# Patient Record
Sex: Male | Born: 1940 | Race: White | Hispanic: No | Marital: Married | Smoking: Never smoker
Health system: Southern US, Community
[De-identification: ages and names within clinical notes are randomized; demographics above are authoritative.]

## PROBLEM LIST (undated history)

## (undated) DIAGNOSIS — G454 Transient global amnesia: Secondary | ICD-10-CM

## (undated) DIAGNOSIS — K219 Gastro-esophageal reflux disease without esophagitis: Secondary | ICD-10-CM

## (undated) DIAGNOSIS — I6529 Occlusion and stenosis of unspecified carotid artery: Secondary | ICD-10-CM

## (undated) DIAGNOSIS — I739 Peripheral vascular disease, unspecified: Secondary | ICD-10-CM

## (undated) DIAGNOSIS — I493 Ventricular premature depolarization: Secondary | ICD-10-CM

## (undated) DIAGNOSIS — R51 Headache: Secondary | ICD-10-CM

## (undated) DIAGNOSIS — Z87442 Personal history of urinary calculi: Secondary | ICD-10-CM

## (undated) DIAGNOSIS — I10 Essential (primary) hypertension: Secondary | ICD-10-CM

## (undated) DIAGNOSIS — M1990 Unspecified osteoarthritis, unspecified site: Secondary | ICD-10-CM

## (undated) DIAGNOSIS — R519 Headache, unspecified: Secondary | ICD-10-CM

## (undated) DIAGNOSIS — E785 Hyperlipidemia, unspecified: Secondary | ICD-10-CM

## (undated) HISTORY — DX: Hyperlipidemia, unspecified: E78.5

## (undated) HISTORY — PX: TONSILLECTOMY: SUR1361

## (undated) HISTORY — PX: TENDON REPAIR: SHX5111

## (undated) HISTORY — PX: CAROTID ENDARTERECTOMY: SUR193

## (undated) HISTORY — DX: Ventricular premature depolarization: I49.3

## (undated) HISTORY — DX: Occlusion and stenosis of unspecified carotid artery: I65.29

## (undated) HISTORY — DX: Essential (primary) hypertension: I10

---

## 1992-02-15 HISTORY — PX: BUNIONECTOMY: SHX129

## 1997-02-14 HISTORY — PX: CARPAL TUNNEL RELEASE: SHX101

## 1998-06-18 ENCOUNTER — Emergency Department (HOSPITAL_COMMUNITY): Admission: EM | Admit: 1998-06-18 | Discharge: 1998-06-18 | Payer: Self-pay | Admitting: Emergency Medicine

## 2002-09-23 ENCOUNTER — Encounter: Admission: RE | Admit: 2002-09-23 | Discharge: 2002-09-23 | Payer: Self-pay | Admitting: Gastroenterology

## 2002-09-23 ENCOUNTER — Encounter: Payer: Self-pay | Admitting: Gastroenterology

## 2002-10-22 ENCOUNTER — Ambulatory Visit (HOSPITAL_COMMUNITY): Admission: RE | Admit: 2002-10-22 | Discharge: 2002-10-22 | Payer: Self-pay | Admitting: *Deleted

## 2005-03-02 ENCOUNTER — Ambulatory Visit (HOSPITAL_COMMUNITY): Admission: RE | Admit: 2005-03-02 | Discharge: 2005-03-02 | Payer: Self-pay | Admitting: Orthopedic Surgery

## 2005-03-02 ENCOUNTER — Encounter (INDEPENDENT_AMBULATORY_CARE_PROVIDER_SITE_OTHER): Payer: Self-pay | Admitting: Specialist

## 2009-03-31 ENCOUNTER — Encounter: Admission: RE | Admit: 2009-03-31 | Discharge: 2009-03-31 | Payer: Self-pay | Admitting: Family Medicine

## 2009-10-23 ENCOUNTER — Emergency Department (HOSPITAL_COMMUNITY): Admission: EM | Admit: 2009-10-23 | Discharge: 2009-10-23 | Payer: Self-pay | Admitting: Emergency Medicine

## 2010-04-27 ENCOUNTER — Other Ambulatory Visit: Payer: Self-pay | Admitting: Gastroenterology

## 2010-04-29 LAB — DIFFERENTIAL
Basophils Absolute: 0.1 10*3/uL (ref 0.0–0.1)
Basophils Relative: 1 % (ref 0–1)
Eosinophils Absolute: 0.5 10*3/uL (ref 0.0–0.7)
Eosinophils Relative: 7 % — ABNORMAL HIGH (ref 0–5)
Lymphs Abs: 2.2 10*3/uL (ref 0.7–4.0)
Monocytes Relative: 10 % (ref 3–12)
Neutro Abs: 3.6 10*3/uL (ref 1.7–7.7)
Neutrophils Relative %: 51 % (ref 43–77)

## 2010-04-29 LAB — POCT I-STAT, CHEM 8
BUN: 16 mg/dL (ref 6–23)
Calcium, Ion: 1.17 mmol/L (ref 1.12–1.32)
Creatinine, Ser: 1 mg/dL (ref 0.4–1.5)
Glucose, Bld: 94 mg/dL (ref 70–99)

## 2010-04-29 LAB — CBC
HCT: 39.2 % (ref 39.0–52.0)
Hemoglobin: 13.2 g/dL (ref 13.0–17.0)
MCHC: 33.7 g/dL (ref 30.0–36.0)
Platelets: 198 10*3/uL (ref 150–400)
RBC: 4.52 MIL/uL (ref 4.22–5.81)

## 2010-04-30 ENCOUNTER — Ambulatory Visit
Admission: RE | Admit: 2010-04-30 | Discharge: 2010-04-30 | Disposition: A | Discharge status: Home / Self Care | Payer: Medicare Other | Source: Ambulatory Visit | Attending: Gastroenterology | Admitting: Gastroenterology

## 2010-07-02 NOTE — Op Note (Signed)
NAME:  Patrick Vargas, Patrick Vargas NO.:  [id number]   MEDICAL RECORD NO.:  [id number]          PATIENT TYPE:  AMB   LOCATION:  DAY                          FACILITY:  [facility]   PHYSICIAN:  Marlowe Kays, M.D.  DATE OF BIRTH:  May 27, 1940   DATE OF PROCEDURE:  03/02/2005  DATE OF DISCHARGE:                                 OPERATIVE REPORT   PREOPERATIVE DIAGNOSES:  1.  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  2.  Tenosynovitis secondary to large osteophytes in midfoot right .   POSTOPERATIVE DIAGNOSES:  1.  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  2.  Tenosynovitis secondary to large osteophytes in midfoot right .   OPERATION:  1.  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  2.  Dorsal tenosynovectomy and excision of large osteophytes and calcium      deposition from tarsal-metatarsal and mid foot on the right.   SURGEON:  Marlowe Kays, M.D.   ASSISTANT:  Nurse.   ANESTHESIA:  General.   PATHOLOGY AND JUSTIFICATION FOR PROCEDURE:  I performed Morton's neuroma  excisions in his left foot. He has had extended pain and tenderness in the  second and third web space only in the right foot with findings consistent  with a Morton's neuroma there as well.  He also has had progressive fluid  accumulation on the dorsum of his right foot with lateral x-ray  demonstrating large osteophytes mainly from the tarsal metatarsal joint but  also proximal to it and an area of calcium deposition adjacent to these  osteophytes.   DESCRIPTION OF PROCEDURE:  Satisfactory general anesthesia, pneumatic  tourniquet, leg was esmarched out nonsterilely and prepped from just above  the ankle to the toes with DuraPrep, draped in a sterile field. I first made  a dorsal web splitting incision second and third toes. There was a good bit  of scar present. The Morton's neuroma is not as well defined as normally  with a lot of scar present. I basically cleaned out all the  scar, all the  fibrous tissue and congealed nerve sending it to pathology for  identification. At the conclusion of the clean out, all soft tissue and  interposed between the second and third metatarsal heads had been removed. I  then made a lazy S incision on the dorsum of his foot, neurovascular  structures were protected. Working down to the osteophytes, the contained  cyst ruptured on its own and a serosanguineous clear appearance. I did a  synovectomy of the local tendons, there was nothing that looked rheumatoid.  The large osteophytes were isolated and removed with a combination of  osteotome and rongeur, including soft calcium type mass to the more lateral  side of the first tarsometatarsal joint. When I had cleaned off the  osteophytes visible, I covered the raw bone with bone wax and released the  tourniquet, several small bleeders were coagulated. Both wounds essentially  dry on closure. I irrigated the wound well and infiltrated both wounds with  half percent plain Marcaine and closed them with interrupted 3-0 Vicryl  subcutaneous tissue and in the  foot a combination of interrupted and running 4-0 nylon and in the webspace  interrupted 4-0 nylon. Betadine adaptic dry sterile dressing were applied.  He tolerated the procedure well and was taken to the recovery room in  satisfactory condition with no known complications.           ______________________________  Marlowe Kays, M.D.     JA/MEDQ  D:  03/02/2005  T:  03/02/2005  Job:  [id number]

## 2010-07-02 NOTE — Op Note (Signed)
   NAME:  Patrick Vargas, Patrick Vargas NO.:  [id number]   MEDICAL RECORD NO.:  [id number]                   PATIENT TYPE:  AMB   LOCATION:  ENDO                                 FACILITY:  [facility]   PHYSICIAN:  Graylin Shiver, M.D.                DATE OF BIRTH:  11-15-1940   DATE OF PROCEDURE:  10/22/2002  DATE OF DISCHARGE:                                 OPERATIVE REPORT   PROCEDURE PERFORMED:  Esophagogastroduodenoscopy with endoscopic balloon  dilation of a Schatzki's ring.   ENDOSCOPIST:  Graylin Shiver, M.D.   INDICATIONS FOR PROCEDURE:  Dysphagia.  Barium swallow revealed a small  hiatal hernia with a shallow ring at the gastroesophageal junction.  Informed consent was obtained after explanation of the risks of bleeding,  infection and perforation.   PREMEDICATIONS:  Fentanyl 80 mcg IV, Versed 8 mg IV.   DESCRIPTION OF PROCEDURE:  With the patient in the left lateral decubitus  position the Olympus gastroscope was inserted into the oropharynx and passed  into the esophagus.  It was advanced down the esophagus and into the stomach  and into the duodenum.  The second portion and bulb of the duodenum were  normal.  The stomach looked normal in its entirety including the upper  fundus and cardia seen on retroflexion.  The scope was brought back into the  esophagus. The esophagogastric junction was at 40 cm.  There was a small  hiatal hernia.  A  Schatzki's ring was intermittently noted at this region.  The rest of the  esophagus looked normal.  An endoscopic balloon dilator was advanced down  the scope and appropriately placed at the level of the ring.  It was  inflated to 15, then 16.5, then 18 mm and held in place at each level for  one minute.  The balloon was then deflated and removed along with the scope.  The patient tolerated the procedure well without complications.   IMPRESSION:  Dysphagia secondary to a small Schatzki's ring.  This was  dilated to 18  mm.  There is also a small hiatal hernia.                                                Graylin Shiver, M.D.    Germain Osgood  D:  10/22/2002  T:  10/22/2002  Job:  [id number]   cc:   Otilio Connors. Gerri Spore, M.D.  [address]  [place]  [place] [address]  Fax: [phone]

## 2010-11-12 ENCOUNTER — Other Ambulatory Visit (HOSPITAL_COMMUNITY): Payer: Self-pay | Admitting: Orthopedic Surgery

## 2010-11-12 ENCOUNTER — Encounter (HOSPITAL_COMMUNITY)
Admission: RE | Admit: 2010-11-12 | Discharge: 2010-11-12 | Disposition: A | Discharge status: Home / Self Care | Payer: Medicare Other | Source: Ambulatory Visit | Attending: Orthopedic Surgery | Admitting: Orthopedic Surgery

## 2010-11-12 DIAGNOSIS — M1711 Unilateral primary osteoarthritis, right knee: Secondary | ICD-10-CM

## 2010-11-12 LAB — DIFFERENTIAL
Basophils Absolute: 0 10*3/uL (ref 0.0–0.1)
Eosinophils Relative: 3 % (ref 0–5)
Monocytes Absolute: 0.5 10*3/uL (ref 0.1–1.0)
Neutrophils Relative %: 51 % (ref 43–77)

## 2010-11-12 LAB — CBC
MCHC: 35.3 g/dL (ref 30.0–36.0)
MCV: 86.7 fL (ref 78.0–100.0)
Platelets: 214 10*3/uL (ref 150–400)
RDW: 13.2 % (ref 11.5–15.5)
WBC: 6.2 10*3/uL (ref 4.0–10.5)

## 2010-11-12 LAB — BASIC METABOLIC PANEL
CO2: 28 mEq/L (ref 19–32)
Calcium: 9.8 mg/dL (ref 8.4–10.5)
Chloride: 99 mEq/L (ref 96–112)
Creatinine, Ser: 1.02 mg/dL (ref 0.50–1.35)
GFR calc Af Amer: >60 mL/min (ref >60)
Glucose, Bld: 92 mg/dL (ref 70–99)
Sodium: 138 mEq/L (ref 135–145)

## 2010-11-12 LAB — URINALYSIS, ROUTINE W REFLEX MICROSCOPIC
Bilirubin Urine: NEGATIVE
Glucose, UA: NEGATIVE mg/dL
Hgb urine dipstick: NEGATIVE
pH: 7 (ref 5.0–8.0)

## 2010-11-12 LAB — PROTIME-INR
INR: 0.92 (ref 0.00–1.49)
Prothrombin Time: 12.6 seconds (ref 11.6–15.2)

## 2010-11-12 LAB — TYPE AND SCREEN
ABO/RH(D): A POS
Antibody Screen: NEGATIVE

## 2010-11-12 LAB — SURGICAL PCR SCREEN: MRSA, PCR: NEGATIVE

## 2010-11-22 ENCOUNTER — Inpatient Hospital Stay (HOSPITAL_COMMUNITY)
Admission: RE | Admit: 2010-11-22 | Discharge: 2010-11-25 | DRG: 470 | Disposition: A | Discharge status: Home Health | Payer: Medicare Other | Source: Ambulatory Visit | Attending: Orthopedic Surgery | Admitting: Orthopedic Surgery

## 2010-11-22 DIAGNOSIS — Z7982 Long term (current) use of aspirin: Secondary | ICD-10-CM

## 2010-11-22 DIAGNOSIS — Z888 Allergy status to other drugs, medicaments and biological substances status: Secondary | ICD-10-CM

## 2010-11-22 DIAGNOSIS — I10 Essential (primary) hypertension: Secondary | ICD-10-CM | POA: Diagnosis present

## 2010-11-22 DIAGNOSIS — M1710 Unilateral primary osteoarthritis, unspecified knee: Principal | ICD-10-CM | POA: Diagnosis present

## 2010-11-22 DIAGNOSIS — Z886 Allergy status to analgesic agent status: Secondary | ICD-10-CM

## 2010-11-22 DIAGNOSIS — E785 Hyperlipidemia, unspecified: Secondary | ICD-10-CM | POA: Diagnosis present

## 2010-11-22 NOTE — Op Note (Signed)
NAME:  STEPHENSON, CICHY NO.:  [id number]  MEDICAL RECORD NO.:  [id number]  LOCATION:  5034                         FACILITY:  [facility]  PHYSICIAN:  Feliberto Gottron. Turner Daniels, M.D.   DATE OF BIRTH:  [birth year]  DATE OF PROCEDURE:  11/22/2010 DATE OF DISCHARGE:                              OPERATIVE REPORT   PREOPERATIVE DIAGNOSIS:  End-stage arthritis, right knee with varus deformity.  POSTOPERATIVE DIAGNOSIS:  End-stage arthritis, right knee with varus deformity.  PROCEDURE:  Right total knee arthroplasty using DePuy Sigma RP components 3 right femur, 4 tibia, 10 mm Sigma RP bearing, and a 38-mm patellar button double batch of DePuy HV cement with 1500 mg of Zinacef. All components cemented.  SURGEON:  Feliberto Gottron. Turner Daniels, MD  FIRST ASSISTANT:  Shirl Harris, PA-C  ANESTHETIC:  General endotracheal.  ESTIMATED BLOOD LOSS:  Minimal.  FLUID REPLACEMENT:  1500 mL of crystalloid.  DRAINS PLACED:  Two medium Hemovacs, Foley catheter.  URINE OUTPUT:  300 mL.  TOURNIQUET TIME:  1 hour and 5 minutes.  INDICATIONS FOR PROCEDURE:  A 70 year old man end-stage arthritis right knee with varus deformity bone-on-bone for the last few years.  He has failed conservative measures with anti-inflammatory medicines, exercise program, judicious use of narcotics and cortisone injections, was offered viscosupplementation but declined.  He is down to bone-on-bone and now desires elective right total knee arthroplasty.  Risks and benefits of surgery were discussed.  Questions answered.  DESCRIPTION OF PROCEDURE:  The patient identified by armband and received preoperative IV antibiotics in the holding area at [facility], also received right femoral nerve block.  Taken to operating room 5 where appropriate anesthetic monitors were attached and general endotracheal anesthesia induced.  The patient in supine position Foley catheter inserted and PAS hose applied to the  contralateral left limb. Tourniquet applied high to the right thigh, lateral post and foot positioner applied to the table, right lower extremity prepped and draped in usual sterile fashion from the ankle to the midthigh.  Time- out procedure performed.  Limb wrapped with an Esmarch bandage, tourniquet inflated to 350 mmHg.  Began the operation by making the anterior midline incision starting handbreadth above the patella going over the patella 1 cm medial to and 4 cm distal to the tibial tubercle. Small bleeders in the skin and subcutaneous tissue identified and cauterized with electrocautery.  Transverse retinaculum incised, reflected medially allowing medial parapatellar arthrotomy.  Patella everted, prepatellar fat pad resected, superficial medial and collateral ligament elevated from anterior-posterior off the proximal tibia allowing [place] to flex and externally rotate the tibia enhancing our exposure.  The anterior one half of the menisci were then resected as were the cruciate ligaments, and we placed a posterior medial Z retractor, McCulloch retractor through the notch and a lateral Hohmann retractor and then drilled the proximal tibia with DePuy step drill followed by the IM rod and a 2-degree posterior slope cutting guide removing 3-4 mm of bone medially, 8-9 mm of bone and cartilage laterally.  We then directed our attention to the distal femur and entered the femoral canal 2 mm anterior to the PCL origin with the step drill followed  by the IM rod and a 5 degree left distal femoral cutting guide was applied at 11 mm along the left epicondylar axis, distal femoral cut accomplished.  We then sized with the posterior referencing sizing guide for a 3 right femoral component, applied the pins in 3 degrees of external rotation followed by the chamfer cutting guide the anterior-posterior chamfer cuts without difficulty, and then the Sigma RP box cut.  The knee was then brought to full  extension.  We checked our extension gap which was excellent for a 10-mm bearing as was the flexion gap.  Posterior horns of the menisci were resected.  The patella was measured at 24 mm, cutting guide set at 15, posterior 9-10 mm of the patella resected, sized for a 38 button and drilled.  We then once again hyperflexed with external rotation, exposed the proximal tibia which was sized to a #4 base plate.  This was pinned into place followed by the smokestack conical reamer and Delta fin keel punch.  We then hammered into place a 3 right trial femoral component, drilled the lugs, inserted the 10-mm bearing trial and a 38-mm patellar button trial.  The knee was reduced brought to full extension.  Ligamentous stability was excellent at 0, 45, and 90 degrees of flexion.  At this point, all trial components were removed, bony surfaces were water picked, clean dried with suction and sponges.  Double batch of DePuy HV cement was mixed and applied to all bony metallic mating surfaces.  There was 1500 mg of Zinacef and cement by the way applied to all bony metallic mating surfaces except for the posterior condyles of the femur itself.  In order, we hammered into place a 4 tibial base plate and removed excess cement, a 3 right femoral component removed the excess cement, a 38 mm patellar button was squeezed into place, excess cement removed and a 10- mm Sigma RP bearing was inserted.  The knee was reduced and held in full extension with compression as the cement cured.  The tissues were then water picked clean and dried with suction.  Medium Hemovac drains were placed from an anterolateral approach, and we then closed in layers with running #1 Vicryl suture in the parapatellar arthrotomy, 0 and 2-0 undyed Vicryl suture in the subcutaneous tissue, and skin staples with a dressing of Xeroform, 4 x 4 dressing sponges, Webril, and an Ace wrap. The patient awakened, extubated and taken to the recovery  room without difficulty.     Feliberto Gottron. Turner Daniels, M.D.     Ovid Curd  D:  11/22/2010  T:  11/22/2010  Job:  [id number]  Electronically Signed by Gean Birchwood M.D. on 11/22/2010 09:57:34 PM

## 2010-11-23 LAB — BASIC METABOLIC PANEL
Calcium: 8.5 mg/dL (ref 8.4–10.5)
Creatinine, Ser: 1.21 mg/dL (ref 0.50–1.35)
GFR calc non Af Amer: 60 mL/min — ABNORMAL LOW (ref >90)
Sodium: 132 mEq/L — ABNORMAL LOW (ref 135–145)

## 2010-11-23 LAB — CBC
Hemoglobin: 11.6 g/dL — ABNORMAL LOW (ref 13.0–17.0)
MCV: 87.4 fL (ref 78.0–100.0)
RBC: 3.88 MIL/uL — ABNORMAL LOW (ref 4.22–5.81)
RDW: 13.5 % (ref 11.5–15.5)
WBC: 11.5 10*3/uL — ABNORMAL HIGH (ref 4.0–10.5)

## 2010-11-23 LAB — PROTIME-INR: INR: 1.12 (ref 0.00–1.49)

## 2010-11-24 LAB — CBC
HCT: 30.6 % — ABNORMAL LOW (ref 39.0–52.0)
MCH: 30.1 pg (ref 26.0–34.0)
MCHC: 34.6 g/dL (ref 30.0–36.0)

## 2010-11-24 LAB — PROTIME-INR: INR: 1.61 — ABNORMAL HIGH (ref 0.00–1.49)

## 2010-11-25 LAB — CBC
HCT: 29.1 % — ABNORMAL LOW (ref 39.0–52.0)
Hemoglobin: 9.9 g/dL — ABNORMAL LOW (ref 13.0–17.0)
MCH: 29.6 pg (ref 26.0–34.0)
MCV: 86.9 fL (ref 78.0–100.0)
RBC: 3.35 MIL/uL — ABNORMAL LOW (ref 4.22–5.81)
WBC: 7.5 10*3/uL (ref 4.0–10.5)

## 2010-12-04 HISTORY — PX: TOTAL KNEE ARTHROPLASTY: SHX125

## 2010-12-10 NOTE — Discharge Summary (Signed)
  NAME:  Patrick Vargas, Patrick Vargas NO.:  [id number]  MEDICAL RECORD NO.:  [id number]  LOCATION:  5034                         FACILITY:  [facility]  PHYSICIAN:  Feliberto Gottron. Turner Daniels, M.D.   DATE OF BIRTH:  [birth year]  DATE OF ADMISSION:  11/22/2010 DATE OF DISCHARGE:  11/25/2010                              DISCHARGE SUMMARY   CHIEF COMPLAINT:  Right knee pain.  HISTORY OF PRESENT ILLNESS:  This is a 69 year old gentleman who complains of severe unremitting pain in his right knee despite extensive conservative treatment.  He now desires a surgical intervention.  All risks and benefits of surgery were discussed with the patient.  PAST MEDICAL HISTORY:  Significant for high cholesterol and hypertension.  He has not had any surgery.  SOCIAL HISTORY:  He denies use of tobacco and drinks occasional alcohol. He is retired.  ALLERGIES:  He has allergies to AMOXICILLIN and OXYCODONE.  FAMILY HISTORY:  Positive for diabetes and heart disease.  OBJECTIVE:  EXTREMITIES:  Gross examination of the right knee demonstrates a varus deformity.  Range of motion is 10-120 degrees.  He has a trace effusion.  X-rays demonstrate bone-on-bone degenerative joint disease in the medial compartment of the right knee.  PREOPERATIVE LABORATORY DATA:  White blood cells 11.5, red blood cells 3.88, hemoglobin 11.6, hematocrit 33.9, platelets 183.  PT 14.6, INR 1.12.  Sodium 132, potassium 3.8, chloride 96, glucose 122, BUN 26, creatinine 1.21.  HOSPITAL COURSE:  Patrick Vargas was admitted to [facility] on November 22, 2010, when he underwent right below-knee arthroplasty.  The procedure was performed by Dr. Gean Birchwood and the patient tolerated it well.  Two Hemovac drains were placed into the right knee.  A perioperative Foley catheter was also placed.  He was transferred to the floor on Lovenox and Coumadin for DVT prophylaxis.  On the first postoperative day, he was awake and alert and tolerating p.o. intake  well.  His knee pain was 2/10.  His drain was removed without difficulty.  Hemoglobin was 11.6. On the second postoperative day, he was making good progress with physical therapy.  Hemoglobin was 10.6.  Surgical dressing was clean, it was changed and his incision was benign.  On the third postoperative day, he was doing well and had met all of his physical therapy goals, so he was discharged home.  DISPOSITION:  The patient was discharged home on November 25, 2010.  He was weightbearing as tolerated.  He will return to the clinic in 10 days for x-rays and staple removal.  He will remain on Coumadin for a total of 2 weeks with a target INR of 1.5-2.0.  Home Health Care would manage his wound, Coumadin, and physical therapy.  FINAL DIAGNOSIS:  End-stage degenerative joint disease of the right knee.     Patrick Harris, PA   ______________________________ Feliberto Gottron. Turner Daniels, M.D.    JW/MEDQ  D:  12/08/2010  T:  12/08/2010  Job:  [id number]  Electronically Signed by Patrick Harris PA on 12/10/2010 10:52:28 AM Electronically Signed by Gean Birchwood M.D. on 12/10/2010 01:28:18 PM

## 2011-03-15 DIAGNOSIS — H2510 Age-related nuclear cataract, unspecified eye: Secondary | ICD-10-CM | POA: Diagnosis not present

## 2011-04-12 DIAGNOSIS — E782 Mixed hyperlipidemia: Secondary | ICD-10-CM | POA: Diagnosis not present

## 2011-04-12 DIAGNOSIS — I10 Essential (primary) hypertension: Secondary | ICD-10-CM | POA: Diagnosis not present

## 2011-05-06 DIAGNOSIS — L570 Actinic keratosis: Secondary | ICD-10-CM | POA: Insufficient documentation

## 2011-05-06 DIAGNOSIS — D485 Neoplasm of uncertain behavior of skin: Secondary | ICD-10-CM | POA: Diagnosis not present

## 2011-05-06 DIAGNOSIS — Z85828 Personal history of other malignant neoplasm of skin: Secondary | ICD-10-CM | POA: Insufficient documentation

## 2011-05-06 DIAGNOSIS — C44319 Basal cell carcinoma of skin of other parts of face: Secondary | ICD-10-CM | POA: Diagnosis not present

## 2011-05-24 DIAGNOSIS — M1710 Unilateral primary osteoarthritis, unspecified knee: Secondary | ICD-10-CM | POA: Diagnosis not present

## 2011-06-01 DIAGNOSIS — C44319 Basal cell carcinoma of skin of other parts of face: Secondary | ICD-10-CM | POA: Diagnosis not present

## 2011-06-08 DIAGNOSIS — Z4802 Encounter for removal of sutures: Secondary | ICD-10-CM | POA: Diagnosis not present

## 2011-07-03 DIAGNOSIS — W57XXXA Bitten or stung by nonvenomous insect and other nonvenomous arthropods, initial encounter: Secondary | ICD-10-CM | POA: Diagnosis not present

## 2011-07-03 DIAGNOSIS — L089 Local infection of the skin and subcutaneous tissue, unspecified: Secondary | ICD-10-CM | POA: Diagnosis not present

## 2011-07-13 DIAGNOSIS — M25569 Pain in unspecified knee: Secondary | ICD-10-CM | POA: Diagnosis not present

## 2011-07-20 DIAGNOSIS — Z483 Aftercare following surgery for neoplasm: Secondary | ICD-10-CM | POA: Insufficient documentation

## 2011-08-08 DIAGNOSIS — L255 Unspecified contact dermatitis due to plants, except food: Secondary | ICD-10-CM | POA: Diagnosis not present

## 2011-11-15 DIAGNOSIS — E782 Mixed hyperlipidemia: Secondary | ICD-10-CM | POA: Diagnosis not present

## 2011-11-15 DIAGNOSIS — Z79899 Other long term (current) drug therapy: Secondary | ICD-10-CM | POA: Diagnosis not present

## 2011-11-15 DIAGNOSIS — M25569 Pain in unspecified knee: Secondary | ICD-10-CM | POA: Diagnosis not present

## 2011-12-16 DIAGNOSIS — I10 Essential (primary) hypertension: Secondary | ICD-10-CM | POA: Diagnosis not present

## 2011-12-16 DIAGNOSIS — E782 Mixed hyperlipidemia: Secondary | ICD-10-CM | POA: Diagnosis not present

## 2011-12-16 DIAGNOSIS — R9431 Abnormal electrocardiogram [ECG] [EKG]: Secondary | ICD-10-CM | POA: Diagnosis not present

## 2011-12-20 DIAGNOSIS — R0602 Shortness of breath: Secondary | ICD-10-CM | POA: Diagnosis not present

## 2011-12-20 DIAGNOSIS — I4949 Other premature depolarization: Secondary | ICD-10-CM | POA: Diagnosis not present

## 2011-12-20 DIAGNOSIS — Z79899 Other long term (current) drug therapy: Secondary | ICD-10-CM | POA: Diagnosis not present

## 2012-01-18 DIAGNOSIS — Z23 Encounter for immunization: Secondary | ICD-10-CM | POA: Diagnosis not present

## 2012-03-06 DIAGNOSIS — R3911 Hesitancy of micturition: Secondary | ICD-10-CM | POA: Diagnosis not present

## 2012-03-06 DIAGNOSIS — E782 Mixed hyperlipidemia: Secondary | ICD-10-CM | POA: Diagnosis not present

## 2012-03-06 DIAGNOSIS — Z79899 Other long term (current) drug therapy: Secondary | ICD-10-CM | POA: Diagnosis not present

## 2012-03-21 DIAGNOSIS — H2510 Age-related nuclear cataract, unspecified eye: Secondary | ICD-10-CM | POA: Diagnosis not present

## 2012-03-26 DIAGNOSIS — K219 Gastro-esophageal reflux disease without esophagitis: Secondary | ICD-10-CM | POA: Diagnosis not present

## 2012-03-26 DIAGNOSIS — Z1211 Encounter for screening for malignant neoplasm of colon: Secondary | ICD-10-CM | POA: Diagnosis not present

## 2012-04-12 ENCOUNTER — Other Ambulatory Visit: Payer: Self-pay | Admitting: Gastroenterology

## 2012-04-12 DIAGNOSIS — K5730 Diverticulosis of large intestine without perforation or abscess without bleeding: Secondary | ICD-10-CM | POA: Diagnosis not present

## 2012-04-12 DIAGNOSIS — D126 Benign neoplasm of colon, unspecified: Secondary | ICD-10-CM | POA: Diagnosis not present

## 2012-04-12 DIAGNOSIS — Z1211 Encounter for screening for malignant neoplasm of colon: Secondary | ICD-10-CM | POA: Diagnosis not present

## 2012-04-12 DIAGNOSIS — K620 Anal polyp: Secondary | ICD-10-CM | POA: Diagnosis not present

## 2012-08-31 ENCOUNTER — Encounter: Payer: Self-pay | Admitting: Cardiovascular Disease

## 2012-08-31 ENCOUNTER — Ambulatory Visit (INDEPENDENT_AMBULATORY_CARE_PROVIDER_SITE_OTHER): Payer: Medicare Other | Admitting: Cardiovascular Disease

## 2012-08-31 VITALS — BP 134/70 | HR 61 | Resp 15 | Ht 70.0 in | Wt 193.7 lb

## 2012-08-31 DIAGNOSIS — Z789 Other specified health status: Secondary | ICD-10-CM

## 2012-08-31 DIAGNOSIS — E785 Hyperlipidemia, unspecified: Secondary | ICD-10-CM | POA: Diagnosis not present

## 2012-08-31 DIAGNOSIS — I10 Essential (primary) hypertension: Secondary | ICD-10-CM | POA: Diagnosis not present

## 2012-08-31 DIAGNOSIS — I4949 Other premature depolarization: Secondary | ICD-10-CM | POA: Diagnosis not present

## 2012-08-31 DIAGNOSIS — I493 Ventricular premature depolarization: Secondary | ICD-10-CM

## 2012-08-31 NOTE — Patient Instructions (Signed)
Your physician recommends that you schedule a follow-up appointment in: 1 year Your physician recommends that you return for lab work just before the one-year appointment. Your physician encouraged you to lose weight for better health. Try to slim down to a waistline of 34 inches and overall weight of about 175 lb by this time next year. Your physician discussed the importance of regular exercise and recommended that you start or continue a regular exercise program for good health.

## 2012-09-03 DIAGNOSIS — E785 Hyperlipidemia, unspecified: Secondary | ICD-10-CM | POA: Insufficient documentation

## 2012-09-03 DIAGNOSIS — E663 Overweight: Secondary | ICD-10-CM | POA: Insufficient documentation

## 2012-09-03 DIAGNOSIS — Z789 Other specified health status: Secondary | ICD-10-CM | POA: Insufficient documentation

## 2012-09-03 DIAGNOSIS — I10 Essential (primary) hypertension: Secondary | ICD-10-CM | POA: Insufficient documentation

## 2012-09-03 DIAGNOSIS — I493 Ventricular premature depolarization: Secondary | ICD-10-CM | POA: Insufficient documentation

## 2012-09-03 NOTE — Assessment & Plan Note (Signed)
Good control. If he loses additional weight we might be able to further reduce antihypertensive medications.

## 2012-09-03 NOTE — Assessment & Plan Note (Signed)
Amongst the agents he has tried:Atorvastatin, simvastatin, rosuvastatin, all causing intolerable myalgia. He is also intolerant of Niaspan

## 2012-09-03 NOTE — Progress Notes (Signed)
Patient ID: Patrick Vargas, male   DOB: 07/25/1940, 72 y.o.   MRN: [id number]     Reason for office visit Followup hyperlipidemia and hypertension  Patrick Vargas returns for routine followup and has done quite well since his last appointment. He remains quite active. He plays golf 2-3 days a week in the summer. In the winter he goes to the gym. He has a total knee replacement and rides a stationary bike 2-3 days a week. He has lost about 3 pounds since his last appointment but remains overweight with a BMI of almost 28. He has not had any cardiovascular complaints since his last appointment. Blood pressure control is now consistently good.  In November of 2013 he had a normal echocardiogram. He has normal nuclear stress test in the past. He has never had coronary angiography.    Allergies  Allergen Reactions  . Amoxicillin Nausea Only  . Oxycodone Nausea Only  . Statins     Depression     Current Outpatient Prescriptions  Medication Sig Dispense Refill  . amLODipine (NORVASC) 10 MG tablet Take 10 mg by mouth daily.      Marland Kitchen aspirin 81 MG tablet Take 81 mg by mouth daily.      . CELEBREX 200 MG capsule 200 mg daily.      . cetirizine (ZYRTEC) 10 MG tablet Take 10 mg by mouth daily.      . colesevelam (WELCHOL) 625 MG tablet Take 1,875 mg by mouth daily.      . fosinopril (MONOPRIL) 20 MG tablet Take 20 mg by mouth daily.      . hydrochlorothiazide (HYDRODIURIL) 25 MG tablet Take 25 mg by mouth daily.      . pantoprazole (PROTONIX) 40 MG tablet Take 40 mg by mouth daily.      Marland Kitchen ZETIA 10 MG tablet Take 10 mg by mouth daily.       No current facility-administered medications for this visit.    Past Medical History  Diagnosis Date  . Dyslipidemia   . Systemic hypertension   . PVC's (premature ventricular contractions)     Past Surgical History  Procedure Laterality Date  . 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 Bunionectomy  1994    Left foot  . Carpal tunnel release  1999     Left hand    No family history on file.  History   Social History  . Marital Status: Married    Spouse Name: N/A    Number of Children: N/A  . Years of Education: N/A   Occupational History  . Not on file.   Social History Main Topics  . Smoking status: Never Smoker   . Smokeless tobacco: Not on file  . Alcohol Use: 2.5 oz/week    5 drink(s) per week  . Drug Use: No  . Sexually Active: Not on file   Other Topics Concern  . Not on file   Social History Narrative  . No narrative on file    Review of systems: The patient specifically denies any chest pain at rest or with exertion, dyspnea at rest or with exertion, orthopnea, paroxysmal nocturnal dyspnea, syncope, palpitations, focal neurological deficits, intermittent claudication, lower extremity edema, unexplained weight gain, cough, hemoptysis or wheezing.  The patient also denies abdominal pain, nausea, vomiting, dysphagia,  Constipation (he has occasional diarrhea), polyuria, polydipsia, dysuria, hematuria, frequency, urgency, abnormal bleeding or bruising, fever, chills, unexpected weight changes, mood swings, change in skin or hair  texture, change in voice quality, auditory or visual problems, allergic reactions or rashes, new musculoskeletal complaints other than usual "aches and pains".   PHYSICAL EXAM BP 134/70  Pulse 61  Resp 15  Ht 5\' 10"  (1.778 m)  Wt 87.862 kg (193 lb 11.2 oz)  BMI 27.79 kg/m2  General: Alert, oriented x3, no distress Head: no evidence of trauma, PERRL, EOMI, no exophtalmos or lid lag, no myxedema, no xanthelasma; normal ears, nose and oropharynx Neck: normal jugular venous pulsations and no hepatojugular reflux; brisk carotid pulses without delay and no carotid bruits Chest: clear to auscultation, no signs of consolidation by percussion or palpation, normal fremitus, symmetrical and full respiratory excursions Cardiovascular: normal position and quality of the apical impulse, regular  rhythm, normal first and second heart sounds, no murmurs, rubs or gallops Abdomen: no tenderness or distention, no masses by palpation, no abnormal pulsatility or arterial bruits, normal bowel sounds, no hepatosplenomegaly Extremities: no clubbing, cyanosis or edema; 2+ radial, ulnar and brachial pulses bilaterally; 2+ right femoral, posterior tibial and dorsalis pedis pulses; 2+ left femoral, posterior tibial and dorsalis pedis pulses; no subclavian or femoral bruits Neurological: grossly nonfocal   EKG: NSR  Lipid Panel  Cholesterol 205, triglycerides 120, HDL 42, LDL 139 January 2014  BMET    Component Value Date/Time   NA 132* 11/23/2010 0507   K 3.8 11/23/2010 0507   CL 96 11/23/2010 0507   CO2 28 11/23/2010 0507   GLUCOSE 122* 11/23/2010 0507   BUN 26* 11/23/2010 0507   CREATININE 1.21 11/23/2010 0507   CALCIUM 8.5 11/23/2010 0507   GFRNONAA 60* 11/23/2010 0507   GFRAA 69* 11/23/2010 0507     ASSESSMENT AND PLAN Hyperlipidemia Patrick Vargas's lipid parameters showed a steady trend of improvement as he has improved his diet and exercise. He seems to have benefited from treatment with WelChol and Zetia, although he has not reached target lipid parameters. He is physically active without functional limitations. He is encouraged to keep up the good work. He remains overweight and should try to reach a target weight of about 175 pounds with the waistline of no more than 34 inches. Once novel cholesterol lowering agent such as PCSK9 inhibitors become available, should probably give those a try.  Hypertension Good control. If he loses additional weight we might be able to further reduce antihypertensive medications.  Statin intolerance Amongst the agents he has tried:Atorvastatin, simvastatin, rosuvastatin, all causing intolerable myalgia. He is also intolerant of Niaspan  Overweight (BMI 25.0-29.9)    Frequent PVCs Chronic asymptomatic problem, not noticed on today's exam or EKG   Orders  Placed This Encounter  Procedures  . EKG 12-Lead   Meds ordered this encounter  Medications  . amLODipine (NORVASC) 10 MG tablet    Sig: Take 10 mg by mouth daily.  . CELEBREX 200 MG capsule    Sig: 200 mg daily.  Marland Kitchen ZETIA 10 MG tablet    Sig: Take 10 mg by mouth daily.  . fosinopril (MONOPRIL) 20 MG tablet    Sig: Take 20 mg by mouth daily.  . hydrochlorothiazide (HYDRODIURIL) 25 MG tablet    Sig: Take 25 mg by mouth daily.  . pantoprazole (PROTONIX) 40 MG tablet    Sig: Take 40 mg by mouth daily.  Marland Kitchen aspirin 81 MG tablet    Sig: Take 81 mg by mouth daily.  . cetirizine (ZYRTEC) 10 MG tablet    Sig: Take 10 mg by mouth daily.  . colesevelam [facility])  625 MG tablet    Sig: Take 1,875 mg by mouth daily.    Junious Silk, MD, [facility] [phone] office [phone] pager

## 2012-09-03 NOTE — Assessment & Plan Note (Signed)
Patrick Vargas's lipid parameters showed a steady trend of improvement as he has improved his diet and exercise. He seems to have benefited from treatment with WelChol and Zetia, although he has not reached target lipid parameters. He is physically active without functional limitations. He is encouraged to keep up the good work. He remains overweight and should try to reach a target weight of about 175 pounds with the waistline of no more than 34 inches. Once novel cholesterol lowering agent such as PCSK9 inhibitors become available, should probably give those a try.

## 2012-09-03 NOTE — Assessment & Plan Note (Signed)
Chronic asymptomatic problem, not noticed on today's exam or EKG

## 2012-10-02 DIAGNOSIS — M25569 Pain in unspecified knee: Secondary | ICD-10-CM | POA: Diagnosis not present

## 2012-10-09 DIAGNOSIS — M1710 Unilateral primary osteoarthritis, unspecified knee: Secondary | ICD-10-CM | POA: Diagnosis not present

## 2012-10-09 DIAGNOSIS — M25569 Pain in unspecified knee: Secondary | ICD-10-CM | POA: Diagnosis not present

## 2012-10-11 DIAGNOSIS — M1710 Unilateral primary osteoarthritis, unspecified knee: Secondary | ICD-10-CM | POA: Diagnosis not present

## 2012-10-11 DIAGNOSIS — M25569 Pain in unspecified knee: Secondary | ICD-10-CM | POA: Diagnosis not present

## 2012-10-18 DIAGNOSIS — M25569 Pain in unspecified knee: Secondary | ICD-10-CM | POA: Diagnosis not present

## 2012-10-18 DIAGNOSIS — M1710 Unilateral primary osteoarthritis, unspecified knee: Secondary | ICD-10-CM | POA: Diagnosis not present

## 2012-10-23 DIAGNOSIS — M25569 Pain in unspecified knee: Secondary | ICD-10-CM | POA: Diagnosis not present

## 2012-10-23 DIAGNOSIS — M1710 Unilateral primary osteoarthritis, unspecified knee: Secondary | ICD-10-CM | POA: Diagnosis not present

## 2012-10-30 DIAGNOSIS — M25569 Pain in unspecified knee: Secondary | ICD-10-CM | POA: Diagnosis not present

## 2012-10-30 DIAGNOSIS — M1710 Unilateral primary osteoarthritis, unspecified knee: Secondary | ICD-10-CM | POA: Diagnosis not present

## 2012-12-26 ENCOUNTER — Telehealth: Payer: Self-pay | Admitting: *Deleted

## 2012-12-26 NOTE — Telephone Encounter (Signed)
Letter received from [employer].  Fosinopril 20mg  is NOT on the 2015 formulary.  Sent list of alternative drugs and Dr. Salena Saner is changing it to Lisinopril 20mg  QD.  Message left for patient to call to discuss.  Will make sure he has enough Fosinopril to last until the end of the year and send a new Rx for Lisinopril to his pharmacy.

## 2012-12-27 MED ORDER — LISINOPRIL 20 MG PO TABS: 20.0000 mg | ORAL_TABLET | Freq: Every day | ORAL | Status: DC

## 2012-12-27 NOTE — Telephone Encounter (Signed)
Patient notified insurance will not cover fosinopril in 2015.  Will switch to lisinopril 20mg  qd.  Patient voiced understanding and requested new Rx to be mailed to him 90 day supply.

## 2013-01-14 DIAGNOSIS — Z23 Encounter for immunization: Secondary | ICD-10-CM | POA: Diagnosis not present

## 2013-02-04 ENCOUNTER — Other Ambulatory Visit: Payer: Self-pay | Admitting: Cardiovascular Disease

## 2013-02-04 NOTE — Telephone Encounter (Signed)
Rx was sent to pharmacy electronically. 

## 2013-03-11 ENCOUNTER — Other Ambulatory Visit: Payer: Self-pay | Admitting: Cardiovascular Disease

## 2013-03-11 NOTE — Telephone Encounter (Signed)
Rx was sent to pharmacy electronically. 

## 2013-04-17 ENCOUNTER — Telehealth: Payer: Self-pay | Admitting: Cardiovascular Disease

## 2013-04-17 MED ORDER — LISINOPRIL 20 MG PO TABS: 20.0000 mg | ORAL_TABLET | Freq: Every day | ORAL | Status: DC

## 2013-04-17 NOTE — Telephone Encounter (Signed)
Insurance will not cover Fosinopril requesting to change to Lisinopril 20mg  qd.  Order placed and patient notified.

## 2013-04-17 NOTE — Telephone Encounter (Signed)
Insurance will not cover Fosinopril.  Alternatives are Benazepril, Lisinopril or captopril.  Patient is going to [place] in the AM.  Are you willing to switch to an alternative?  [place].

## 2013-05-08 ENCOUNTER — Other Ambulatory Visit: Payer: Self-pay | Admitting: Cardiovascular Disease

## 2013-05-08 NOTE — Telephone Encounter (Signed)
Rx was sent to pharmacy electronically. 

## 2013-06-26 DIAGNOSIS — M5412 Radiculopathy, cervical region: Secondary | ICD-10-CM | POA: Diagnosis not present

## 2013-06-26 DIAGNOSIS — M47812 Spondylosis without myelopathy or radiculopathy, cervical region: Secondary | ICD-10-CM | POA: Diagnosis not present

## 2013-07-19 DIAGNOSIS — H2510 Age-related nuclear cataract, unspecified eye: Secondary | ICD-10-CM | POA: Diagnosis not present

## 2013-09-16 ENCOUNTER — Encounter: Payer: Self-pay | Admitting: Cardiovascular Disease

## 2013-09-16 ENCOUNTER — Ambulatory Visit (INDEPENDENT_AMBULATORY_CARE_PROVIDER_SITE_OTHER): Payer: Medicare Other | Admitting: Cardiovascular Disease

## 2013-09-16 VITALS — BP 122/70 | HR 60 | Resp 16 | Ht 70.0 in | Wt 184.4 lb

## 2013-09-16 DIAGNOSIS — Z79899 Other long term (current) drug therapy: Secondary | ICD-10-CM | POA: Diagnosis not present

## 2013-09-16 DIAGNOSIS — R3919 Other difficulties with micturition: Secondary | ICD-10-CM

## 2013-09-16 DIAGNOSIS — E785 Hyperlipidemia, unspecified: Secondary | ICD-10-CM

## 2013-09-16 DIAGNOSIS — I4949 Other premature depolarization: Secondary | ICD-10-CM | POA: Diagnosis not present

## 2013-09-16 DIAGNOSIS — Z888 Allergy status to other drugs, medicaments and biological substances status: Secondary | ICD-10-CM

## 2013-09-16 DIAGNOSIS — E782 Mixed hyperlipidemia: Secondary | ICD-10-CM | POA: Diagnosis not present

## 2013-09-16 DIAGNOSIS — I10 Essential (primary) hypertension: Secondary | ICD-10-CM

## 2013-09-16 DIAGNOSIS — I493 Ventricular premature depolarization: Secondary | ICD-10-CM

## 2013-09-16 DIAGNOSIS — Z789 Other specified health status: Secondary | ICD-10-CM

## 2013-09-16 DIAGNOSIS — R39198 Other difficulties with micturition: Secondary | ICD-10-CM

## 2013-09-16 DIAGNOSIS — E663 Overweight: Secondary | ICD-10-CM

## 2013-09-16 NOTE — Patient Instructions (Signed)
Your physician recommends that you return for lab work in: FASTING at [facility] at your convenience.  Dr. Croitoru recommends that you schedule a follow-up appointment in: One year.    

## 2013-09-16 NOTE — Progress Notes (Signed)
Patient ID: Patrick Vargas, male   DOB: Jun 30, 1940, 73 y.o.   MRN: [id number]     Reason for office visit Hyperlipidemia, hypertension  Patrick Vargas is doing very well. He has been building a garage and has been very physically active. He has lost almost 10 pounds since his last appointment a year ago. He continues to play golf, riding stationary bike and in the wintertime goes to the gym. He denies problems with palpitations, syncope, chest pain, dyspnea, or lower extremity edema but has some symptoms of prostatism.  He has hyperlipidemia and hypertension. He has been intolerant of statins. He had a normal nuclear stress test and echocardiogram in the past. We had been treating his hyperlipidemia with a combination of WelChol and Zetia, to stop both these medications due to the combination of cost and side effects.  On previous appointments was found to have extremely frequent monomorphic PVCs, but today has no evidence of arrhythmia either on exam or electrocardiogram   Allergies  Allergen Reactions  . Amoxicillin Nausea Only  . Oxycodone Nausea Only  . Statins     Depression     Current Outpatient Prescriptions  Medication Sig Dispense Refill  . amLODipine (NORVASC) 10 MG tablet TAKE 1 TABLET BY MOUTH ONCE DAILY  90 tablet  2  . aspirin 81 MG tablet Take 81 mg by mouth daily.      . CELEBREX 200 MG capsule 200 mg daily.      . cetirizine (ZYRTEC) 10 MG tablet Take 10 mg by mouth daily.      . hydrochlorothiazide (HYDRODIURIL) 25 MG tablet TAKE 1 TABLET BY MOUTH DAILY  90 tablet  1  . lisinopril (PRINIVIL,ZESTRIL) 20 MG tablet Take 1 tablet (20 mg total) by mouth daily.  90 tablet  3  . lisinopril (PRINIVIL,ZESTRIL) 20 MG tablet Take 1 tablet (20 mg total) by mouth daily.  90 tablet  2  . pantoprazole (PROTONIX) 40 MG tablet Take 40 mg by mouth daily.       No current facility-administered medications for this visit.    Past Medical History  Diagnosis Date  . Dyslipidemia   .  Systemic hypertension   . PVC's (premature ventricular contractions)     Past Surgical History  Procedure Laterality Date  . 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 Bunionectomy  1994    Left foot  . Carpal tunnel release  1999    Left hand    No family history on file.  History   Social History  . Marital Status: Married    Spouse Name: N/A    Number of Children: N/A  . Years of Education: N/A   Occupational History  . Not on file.   Social History Main Topics  . Smoking status: Never Smoker   . Smokeless tobacco: Not on file  . Alcohol Use: 2.5 oz/week    5 drink(s) per week  . Drug Use: No  . Sexual Activity: Not on file   Other Topics Concern  . Not on file   Social History Narrative  . No narrative on file    Review of systems: The patient specifically denies any chest pain at rest or with exertion, dyspnea at rest or with exertion, orthopnea, paroxysmal nocturnal dyspnea, syncope, palpitations, focal neurological deficits, intermittent claudication, lower extremity edema, unexplained weight gain, cough, hemoptysis or wheezing.  The patient also denies abdominal pain, nausea, vomiting, dysphagia, diarrhea, constipation, polyuria, polydipsia, dysuria, hematuria, frequency, urgency,  abnormal bleeding or bruising, fever, chills, unexpected weight changes, mood swings, change in skin or hair texture, change in voice quality, auditory or visual problems, allergic reactions or rashes, new musculoskeletal complaints other than usual "aches and pains".   PHYSICAL EXAM BP 122/70  Pulse 60  Resp 16  Ht 5\' 10"  (1.778 m)  Wt 184 lb 6.4 oz (83.643 kg)  BMI 26.46 kg/m2  General: Alert, oriented x3, no distress Head: no evidence of trauma, PERRL, EOMI, no exophtalmos or lid lag, no myxedema, no xanthelasma; normal ears, nose and oropharynx Neck: normal jugular venous pulsations and no hepatojugular reflux; brisk carotid pulses without delay and no carotid  bruits Chest: clear to auscultation, no signs of consolidation by percussion or palpation, normal fremitus, symmetrical and full respiratory excursions Cardiovascular: normal position and quality of the apical impulse, regular rhythm, normal first and second heart sounds, no murmurs, rubs or gallops Abdomen: no tenderness or distention, no masses by palpation, no abnormal pulsatility or arterial bruits, normal bowel sounds, no hepatosplenomegaly Extremities: no clubbing, cyanosis or edema; 2+ radial, ulnar and brachial pulses bilaterally; 2+ right femoral, posterior tibial and dorsalis pedis pulses; 2+ left femoral, posterior tibial and dorsalis pedis pulses; no subclavian or femoral bruits Neurological: grossly nonfocal   EKG: Normal sinus rhythm  Lipid Panel January 2014 total cholesterol 205, triglycerides 120, HDL 42, LDL 139 (on WelChol and Zetia) Glucose 93, creatinine 1.0   ASSESSMENT AND PLAN  Patrick Vargas has well-controlled hypertension and is gradually losing weight, almost at an optimal weight range. We'll repeat his lipid profile. He is no longer taking any medications, but has lost weight. In the absence of established vascular disease I don't think he will need PCSK9 inhibitors - the cost will probably not be justified.  Orders Placed This Encounter  Procedures  . EKG 12-Lead    Vivika Poythress  Sanda Klein, MD, [facility] [phone] office [phone] pager

## 2013-09-17 LAB — COMPREHENSIVE METABOLIC PANEL
ALK PHOS: 54 U/L (ref 39–117)
ALT: 16 U/L (ref 0–53)
AST: 21 U/L (ref 0–37)
Albumin: 4.2 g/dL (ref 3.5–5.2)
BILIRUBIN TOTAL: 0.5 mg/dL (ref 0.2–1.2)
BUN: 23 mg/dL (ref 6–23)
CO2: 30 meq/L (ref 19–32)
CREATININE: 1.01 mg/dL (ref 0.50–1.35)
Calcium: 9.6 mg/dL (ref 8.4–10.5)
Chloride: 100 mEq/L (ref 96–112)
Glucose, Bld: 85 mg/dL (ref 70–99)
Potassium: 4.7 mEq/L (ref 3.5–5.3)
Sodium: 139 mEq/L (ref 135–145)
Total Protein: 6.5 g/dL (ref 6.0–8.3)

## 2013-09-17 LAB — LIPID PANEL
CHOL/HDL RATIO: 5.7 ratio
CHOLESTEROL: 222 mg/dL — AB (ref 0–200)
HDL: 39 mg/dL — ABNORMAL LOW (ref >39)
LDL Cholesterol: 139 mg/dL — ABNORMAL HIGH (ref 0–99)
Triglycerides: 221 mg/dL — ABNORMAL HIGH (ref <150)
VLDL: 44 mg/dL — AB (ref 0–40)

## 2013-09-17 LAB — PSA: PSA: 0.97 ng/mL (ref <=4.00)

## 2013-11-07 ENCOUNTER — Other Ambulatory Visit: Payer: Self-pay | Admitting: Cardiovascular Disease

## 2013-11-07 NOTE — Telephone Encounter (Signed)
Rx was sent to pharmacy electronically. 

## 2013-11-21 DIAGNOSIS — Z96651 Presence of right artificial knee joint: Secondary | ICD-10-CM | POA: Diagnosis not present

## 2013-11-25 ENCOUNTER — Other Ambulatory Visit: Payer: Self-pay | Admitting: Cardiovascular Disease

## 2013-11-25 NOTE — Telephone Encounter (Signed)
Rx was sent to pharmacy electronically. 

## 2014-01-29 ENCOUNTER — Other Ambulatory Visit: Payer: Self-pay | Admitting: Cardiovascular Disease

## 2014-01-30 NOTE — Telephone Encounter (Signed)
Rx was sent to pharmacy electronically. 

## 2014-04-09 DIAGNOSIS — Z85828 Personal history of other malignant neoplasm of skin: Secondary | ICD-10-CM | POA: Diagnosis not present

## 2014-04-09 DIAGNOSIS — L570 Actinic keratosis: Secondary | ICD-10-CM | POA: Diagnosis not present

## 2014-04-09 DIAGNOSIS — L821 Other seborrheic keratosis: Secondary | ICD-10-CM | POA: Diagnosis not present

## 2014-10-07 DIAGNOSIS — I10 Essential (primary) hypertension: Secondary | ICD-10-CM | POA: Diagnosis not present

## 2014-10-07 DIAGNOSIS — Z125 Encounter for screening for malignant neoplasm of prostate: Secondary | ICD-10-CM | POA: Diagnosis not present

## 2014-10-07 DIAGNOSIS — Z0000 Encounter for general adult medical examination without abnormal findings: Secondary | ICD-10-CM | POA: Diagnosis not present

## 2014-10-07 DIAGNOSIS — K219 Gastro-esophageal reflux disease without esophagitis: Secondary | ICD-10-CM | POA: Diagnosis not present

## 2014-10-07 DIAGNOSIS — Z23 Encounter for immunization: Secondary | ICD-10-CM | POA: Diagnosis not present

## 2014-10-07 DIAGNOSIS — E785 Hyperlipidemia, unspecified: Secondary | ICD-10-CM | POA: Diagnosis not present

## 2014-10-16 ENCOUNTER — Encounter: Payer: Self-pay | Admitting: Cardiovascular Disease

## 2014-10-16 ENCOUNTER — Ambulatory Visit (INDEPENDENT_AMBULATORY_CARE_PROVIDER_SITE_OTHER): Payer: Medicare Other | Admitting: Cardiovascular Disease

## 2014-10-16 VITALS — BP 116/72 | HR 58 | Ht 70.0 in | Wt 191.0 lb

## 2014-10-16 DIAGNOSIS — E785 Hyperlipidemia, unspecified: Secondary | ICD-10-CM

## 2014-10-16 DIAGNOSIS — Z789 Other specified health status: Secondary | ICD-10-CM

## 2014-10-16 DIAGNOSIS — Z889 Allergy status to unspecified drugs, medicaments and biological substances status: Secondary | ICD-10-CM | POA: Diagnosis not present

## 2014-10-16 DIAGNOSIS — E663 Overweight: Secondary | ICD-10-CM

## 2014-10-16 DIAGNOSIS — I10 Essential (primary) hypertension: Secondary | ICD-10-CM | POA: Diagnosis not present

## 2014-10-16 NOTE — Patient Instructions (Signed)
Your physician wants you to follow-up in: 1 Year. You will receive a reminder letter in the mail two months in advance. If you don't receive a letter, please call our office to schedule the follow-up appointment.  

## 2014-10-16 NOTE — Progress Notes (Signed)
Patient ID: Patrick Vargas, male   DOB: 29-Jan-1941, 74 y.o.   MRN: [id number]     Cardiology Office Note   Date:  10/16/2014   ID:  Patrick Vargas, DOB 1940-10-23, MRN [id number]  PCP:  Hulen Shouts, MD  Cardiologist:   Sanda Klein, MD   Chief Complaint  Patient presents with  . Annual Exam    Patient has no complaints at this time.      History of Present Illness: Patrick Vargas is a 74 y.o. male who presents for  Follow-up for systemic hypertension, asymptomatic PVCs and mild hyperlipidemia.  He has gained back some weight since his last appointment and was told by his new primary care physician, Dr. Justin Mend that his cholesterol has increased. He is trying to be more judicious with his diet and started using his bicycle again. He is able to perform yard work and physical activity and play golf without any chest discomfort, dyspnea, dizziness or syncope. He denies recent problems with palpitations. He has a history of intolerance to statins.  In November of 2013 he had a normal echocardiogram. He has normal nuclear stress test in the past. He has never had coronary angiography.  Past Medical History  Diagnosis Date  . Dyslipidemia   . Systemic hypertension   . PVC's (premature ventricular contractions)     Past Surgical History  Procedure Laterality Date  . 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 Bunionectomy  1994    Left foot  . Carpal tunnel release  1999    Left hand     Current Outpatient Prescriptions  Medication Sig Dispense Refill  . amLODipine (NORVASC) 10 MG tablet TAKE 1 TABLET BY MOUTH DAILY 90 tablet 3  . aspirin 81 MG tablet Take 81 mg by mouth daily.    . CELEBREX 200 MG capsule 200 mg daily.    . cetirizine (ZYRTEC) 10 MG tablet Take 10 mg by mouth daily.    . fluticasone (FLONASE) 50 MCG/ACT nasal spray Place 2 sprays into both nostrils daily.    . hydrochlorothiazide (HYDRODIURIL) 25 MG tablet TAKE 1 TABLET BY MOUTH DAILY 90 tablet 3  .  lisinopril (PRINIVIL,ZESTRIL) 20 MG tablet TAKE 1 TABLET BY MOUTH DAILY 90 tablet 2  . pantoprazole (PROTONIX) 40 MG tablet Take 40 mg by mouth daily.     No current facility-administered medications for this visit.    Allergies:   Amoxicillin; Oxycodone; and Statins    Social History:  The patient  reports that he has never smoked. He does not have any smokeless tobacco history on file. He reports that he drinks about 2.5 oz of alcohol per week. He reports that he does not use illicit drugs.    ROS:  Please see the history of present illness.    Otherwise, review of systems positive for none.   All other systems are reviewed and negative.    PHYSICAL EXAM: VS:  BP 116/72 mmHg  Pulse 58  Ht 5\' 10"  (1.778 m)  Wt 191 lb (86.637 kg)  BMI 27.41 kg/m2 , BMI Body mass index is 27.41 kg/(m^2).  General: Alert, oriented x3, no distress Head: no evidence of trauma, PERRL, EOMI, no exophtalmos or lid lag, no myxedema, no xanthelasma; normal ears, nose and oropharynx Neck: normal jugular venous pulsations and no hepatojugular reflux; brisk carotid pulses without delay and no carotid bruits Chest: clear to auscultation, no signs of consolidation by percussion or palpation, normal fremitus, symmetrical and  full respiratory excursions Cardiovascular: normal position and quality of the apical impulse, regular rhythm, normal first and second heart sounds, no murmurs, rubs or gallops Abdomen: no tenderness or distention, no masses by palpation, no abnormal pulsatility or arterial bruits, normal bowel sounds, no hepatosplenomegaly Extremities: no clubbing, cyanosis or edema; 2+ radial, ulnar and brachial pulses bilaterally; 2+ right femoral, posterior tibial and dorsalis pedis pulses; 2+ left femoral, posterior tibial and dorsalis pedis pulses; no subclavian or femoral bruits Neurological: grossly nonfocal Psych: euthymic mood, full affect   EKG:  EKG is ordered today. The ekg ordered today  demonstrates  Mild sinus bradycardia, otherwise normal   Recent Labs: No results found for requested labs within last 365 days.    Lipid Panel    Component Value Date/Time   CHOL 222* 09/16/2013 0832   TRIG 221* 09/16/2013 0832   HDL 39* 09/16/2013 0832   CHOLHDL 5.7 09/16/2013 0832   VLDL 44* 09/16/2013 0832   LDLCALC 139* 09/16/2013 0832      Wt Readings from Last 3 Encounters:  10/16/14 191 lb (86.637 kg)  09/16/13 184 lb 6.4 oz (83.643 kg)  08/31/12 193 lb 11.2 oz (87.862 kg)      ASSESSMENT AND PLAN:  1.  Essential hypertension, well controlled 2.  Overweight , he is aware of the need to lose weight and is exercising more 3.  History of moderate hyperlipidemia, intolerant to statins.  Most recent lab results have improved to the point that PCS K9 inhibitors are likely not necessary,  She does not have established vascular disease. 4.  Frequent PVCs - Chronic asymptomatic problem, not noticed on today's exam or EKG   Current medicines are reviewed at length with the patient today.  The patient does not have concerns regarding medicines.  The following changes have been made:  no change  Labs/ tests ordered today include:  No orders of the defined types were placed in this encounter.     Patient Instructions  Your physician wants you to follow-up in: 1 Year. You will receive a reminder letter in the mail two months in advance. If you don't receive a letter, please call our office to schedule the follow-up appointment.      Mikael Spray, MD  10/16/2014 2:20 PM    Sanda Klein, MD, [facility] [phone] office [phone] pager

## 2014-10-23 ENCOUNTER — Encounter: Payer: Self-pay | Admitting: Cardiovascular Disease

## 2014-11-12 ENCOUNTER — Encounter: Payer: Self-pay | Admitting: Cardiology

## 2014-11-12 ENCOUNTER — Encounter: Payer: Self-pay | Admitting: Cardiovascular Disease

## 2014-11-17 ENCOUNTER — Other Ambulatory Visit: Payer: Self-pay | Admitting: Cardiovascular Disease

## 2014-11-17 NOTE — Telephone Encounter (Signed)
Rx request sent to pharmacy.  

## 2014-11-20 DIAGNOSIS — Z23 Encounter for immunization: Secondary | ICD-10-CM | POA: Diagnosis not present

## 2014-12-30 ENCOUNTER — Other Ambulatory Visit: Payer: Self-pay | Admitting: Cardiovascular Disease

## 2014-12-31 NOTE — Telephone Encounter (Signed)
Rx(s) sent to pharmacy electronically.  

## 2015-01-28 DIAGNOSIS — H04123 Dry eye syndrome of bilateral lacrimal glands: Secondary | ICD-10-CM | POA: Diagnosis not present

## 2015-01-28 DIAGNOSIS — H2512 Age-related nuclear cataract, left eye: Secondary | ICD-10-CM | POA: Diagnosis not present

## 2015-01-28 DIAGNOSIS — H524 Presbyopia: Secondary | ICD-10-CM | POA: Diagnosis not present

## 2015-04-10 DIAGNOSIS — L821 Other seborrheic keratosis: Secondary | ICD-10-CM | POA: Diagnosis not present

## 2015-04-10 DIAGNOSIS — L570 Actinic keratosis: Secondary | ICD-10-CM | POA: Diagnosis not present

## 2015-04-10 DIAGNOSIS — Z85828 Personal history of other malignant neoplasm of skin: Secondary | ICD-10-CM | POA: Diagnosis not present

## 2015-04-10 DIAGNOSIS — D229 Melanocytic nevi, unspecified: Secondary | ICD-10-CM | POA: Diagnosis not present

## 2015-06-23 DIAGNOSIS — M1712 Unilateral primary osteoarthritis, left knee: Secondary | ICD-10-CM | POA: Diagnosis not present

## 2015-06-23 DIAGNOSIS — M84375A Stress fracture, left foot, initial encounter for fracture: Secondary | ICD-10-CM | POA: Diagnosis not present

## 2015-06-23 DIAGNOSIS — M79672 Pain in left foot: Secondary | ICD-10-CM | POA: Diagnosis not present

## 2015-06-23 DIAGNOSIS — M1711 Unilateral primary osteoarthritis, right knee: Secondary | ICD-10-CM | POA: Diagnosis not present

## 2015-08-27 DIAGNOSIS — M25571 Pain in right ankle and joints of right foot: Secondary | ICD-10-CM | POA: Diagnosis not present

## 2015-09-01 DIAGNOSIS — M25571 Pain in right ankle and joints of right foot: Secondary | ICD-10-CM | POA: Diagnosis not present

## 2015-09-22 DIAGNOSIS — S96811A Strain of other specified muscles and tendons at ankle and foot level, right foot, initial encounter: Secondary | ICD-10-CM | POA: Diagnosis not present

## 2015-10-05 DIAGNOSIS — M6701 Short Achilles tendon (acquired), right ankle: Secondary | ICD-10-CM | POA: Diagnosis not present

## 2015-10-05 DIAGNOSIS — M76821 Posterior tibial tendinitis, right leg: Secondary | ICD-10-CM | POA: Diagnosis not present

## 2015-10-12 DIAGNOSIS — Z23 Encounter for immunization: Secondary | ICD-10-CM | POA: Diagnosis not present

## 2015-10-12 DIAGNOSIS — Z0000 Encounter for general adult medical examination without abnormal findings: Secondary | ICD-10-CM | POA: Diagnosis not present

## 2015-10-12 DIAGNOSIS — I10 Essential (primary) hypertension: Secondary | ICD-10-CM | POA: Diagnosis not present

## 2015-10-12 DIAGNOSIS — K219 Gastro-esophageal reflux disease without esophagitis: Secondary | ICD-10-CM | POA: Diagnosis not present

## 2015-10-12 DIAGNOSIS — E785 Hyperlipidemia, unspecified: Secondary | ICD-10-CM | POA: Diagnosis not present

## 2015-11-05 DIAGNOSIS — M2141 Flat foot [pes planus] (acquired), right foot: Secondary | ICD-10-CM | POA: Diagnosis not present

## 2015-11-05 DIAGNOSIS — M25572 Pain in left ankle and joints of left foot: Secondary | ICD-10-CM | POA: Diagnosis not present

## 2015-11-05 DIAGNOSIS — M6701 Short Achilles tendon (acquired), right ankle: Secondary | ICD-10-CM | POA: Diagnosis not present

## 2015-11-05 DIAGNOSIS — M19071 Primary osteoarthritis, right ankle and foot: Secondary | ICD-10-CM | POA: Diagnosis not present

## 2015-11-19 ENCOUNTER — Encounter: Payer: Self-pay | Admitting: Cardiovascular Disease

## 2015-11-19 ENCOUNTER — Ambulatory Visit (INDEPENDENT_AMBULATORY_CARE_PROVIDER_SITE_OTHER): Payer: Medicare Other | Admitting: Cardiovascular Disease

## 2015-11-19 VITALS — BP 130/78 | HR 73 | Ht 69.0 in | Wt 192.0 lb

## 2015-11-19 DIAGNOSIS — E7800 Pure hypercholesterolemia, unspecified: Secondary | ICD-10-CM | POA: Diagnosis not present

## 2015-11-19 DIAGNOSIS — E663 Overweight: Secondary | ICD-10-CM | POA: Diagnosis not present

## 2015-11-19 DIAGNOSIS — Z01810 Encounter for preprocedural cardiovascular examination: Secondary | ICD-10-CM

## 2015-11-19 DIAGNOSIS — I493 Ventricular premature depolarization: Secondary | ICD-10-CM

## 2015-11-19 DIAGNOSIS — I10 Essential (primary) hypertension: Secondary | ICD-10-CM

## 2015-11-19 MED ORDER — PRAVASTATIN SODIUM 40 MG PO TABS: 40.0000 mg | ORAL_TABLET | Freq: Every evening | ORAL | 3 refills | Status: DC

## 2015-11-19 NOTE — Progress Notes (Signed)
Cardiology Office Note    Date:  11/19/2015   ID:  Patrick Vargas, DOB [birth year], MRN [id number]  PCP:  Jonathon Bellows, MD  Cardiologist:   Sanda Klein, MD   Chief Complaint  Patient presents with  . Follow-up    12 MONTHS    History of Present Illness:  Patrick Vargas is a 75 y.o. male with hypertension and hyperlipidemia returning for routine follow-up. He has not had any cardiovascular events since his last appointment, but had a right posterior tibial tendon rupture. He wore a boot for several weeks, but his MRI shows a virtually complete tear. He is discussing surgical repair with Dr. Doran Durand. Recovery is anticipated to be lengthy.  The patient specifically denies any chest pain at rest exertion, dyspnea at rest or with exertion, orthopnea, paroxysmal nocturnal dyspnea, syncope, palpitations, focal neurological deficits, intermittent claudication, lower extremity edema, unexplained weight gain, cough, hemoptysis or wheezing. Spiders tendon rupture, he was able to play golf yesterday and denies any limitations from a breathing standpoint.  He had a normal echo in 2013 a normal nuclear stress tests but has never had coronary angiography. His ECG today is unchanged from a year ago. Recently performed lipid profile shows severely elevated LDL cholesterol. His weight is not much change from last year     Past Medical History:  Diagnosis Date  . Dyslipidemia   . PVC's (premature ventricular contractions)   . Systemic hypertension     Past Surgical History:  Procedure Laterality Date  . BUNIONECTOMY  1994   Left foot  . CARPAL TUNNEL RELEASE  1999   Left hand  . TOTAL KNEE ARTHROPLASTY  12/04/2010   Right    Current Medications: Outpatient Medications Prior to Visit  Medication Sig Dispense Refill  . amLODipine (NORVASC) 10 MG tablet TAKE 1 TABLET BY MOUTH EVERY DAY 90 tablet 3  . aspirin 81 MG tablet Take 81 mg by mouth daily.    . CELEBREX 200 MG capsule 200 mg daily.     . cetirizine (ZYRTEC) 10 MG tablet Take 10 mg by mouth daily.    . fluticasone (FLONASE) 50 MCG/ACT nasal spray Place 2 sprays into both nostrils daily.    . hydrochlorothiazide (HYDRODIURIL) 25 MG tablet TAKE 1 TABLET BY MOUTH DAILY 90 tablet 3  . lisinopril (PRINIVIL,ZESTRIL) 20 MG tablet TAKE 1 TABLET BY MOUTH DAILY 90 tablet 3  . pantoprazole (PROTONIX) 40 MG tablet Take 40 mg by mouth daily.     No facility-administered medications prior to visit.      Allergies:   Amoxicillin; Oxycodone; and Statins   Social History   Social History  . Marital status: Married    Spouse name: N/A  . Number of children: N/A  . Years of education: N/A   Social History Main Topics  . Smoking status: Never Smoker  . Smokeless tobacco: Never Used  . Alcohol use 2.5 oz/week    5 Standard drinks or equivalent per week  . Drug use: No  . Sexual activity: Not Asked   Other Topics Concern  . None   Social History Narrative  . None      ROS:   Please see the history of present illness.    ROS All other systems reviewed and are negative.   PHYSICAL EXAM:   VS:  BP 130/78   Pulse 73   Ht 5\' 9"  (1.753 m)   Wt 87.1 kg (192 lb)   BMI 28.35 kg/m  GEN: Well nourished, well developed, in no acute distress  HEENT: normal  Neck: no JVD, carotid bruits, or masses Cardiac: RRR; no murmurs, rubs, or gallops,no edema  Respiratory:  clear to auscultation bilaterally, normal work of breathing GI: soft, nontender, nondistended, + BS MS: no deformity or atrophy  Skin: warm and dry, no rash Neuro:  Alert and Oriented x 3, Strength and sensation are intact Psych: euthymic mood, full affect  Wt Readings from Last 3 Encounters:  11/19/15 87.1 kg (192 lb)  10/16/14 86.6 kg (191 lb)  09/16/13 83.6 kg (184 lb 6.4 oz)      Studies/Labs Reviewed:   EKG:  EKG is ordered today.  The ekg ordered today demonstrates Normal sinus rhythm, mild delay and anterior R-wave progression, QTC 4 and the 16  ms  Recent Labs: Creatinine 1.35, normal LFTs and electrolytes and glucose  Lipid Panel Recent performed by PCP triglycerides 245, LDL 179, HDL 40     Component Value Date/Time   CHOL 222 (H) 09/16/2013 0832   TRIG 221 (H) 09/16/2013 0832   HDL 39 (L) 09/16/2013 0832   CHOLHDL 5.7 09/16/2013 0832   VLDL 44 (H) 09/16/2013 0832   LDLCALC 139 (H) 09/16/2013 [id number]    Additional studies/ records that were reviewed today include:  Notes from primary care provider and orthopedic surgeon  ASSESSMENT:    1. Pure hypercholesterolemia   2. Essential hypertension   3. Overweight (BMI 25.0-29.9)   4. Frequent PVCs   5. Preoperative cardiovascular examination      PLAN:  In order of problems listed above:  1. HLP: Even in the absence of known coronary or vascular disease is elevated LDL cholesterol requires treatment. He has a history of generalized weakness with simvastatin in the past. He will try pravastatin 40 mg at bedtime, this was recommended by his PCP and is the same medicine that his wife Patrick Vargas takes. Reevaluate in a few months. Low pravastatin is a relatively weak statin it may be enough to bring him to target range of around the 100 mg/deciliter. 2. HTN: Well controlled on current medications. 3. Weight loss recommended 4. PVCs: No symptoms related to these, none detected on today's ECG or exam 5. I think he is at low risk for major perioperative cardiovascular complications with planned right posterior tibial tendon repair.    Medication Adjustments/Labs and Tests Ordered: Current medicines are reviewed at length with the patient today.  Concerns regarding medicines are outlined above.  Medication changes, Labs and Tests ordered today are listed in the Patient Instructions below. Patient Instructions  Dr Sallyanne Kuster has recommended making the following medication changes: 1. START Pravastatin 40 mg - take 1 tablet by mouth every evening.  Dr Sallyanne Kuster recommends that you  schedule a follow-up appointment in 1 year. You will receive a reminder letter in the mail two months in advance. If you don't receive a letter, please call our office to schedule the follow-up appointment.  If you need a refill on your cardiac medications before your next appointment, please call your pharmacy.    Signed, Sanda Klein, MD  11/19/2015 3:12 PM    [facility] [place], [place], [place]  [address] Phone: [phone]; Fax: [phone]

## 2015-11-19 NOTE — Patient Instructions (Signed)
Dr Sallyanne Kuster has recommended making the following medication changes: 1. START Pravastatin 40 mg - take 1 tablet by mouth every evening.  Dr Sallyanne Kuster recommends that you schedule a follow-up appointment in 1 year. You will receive a reminder letter in the mail two months in advance. If you don't receive a letter, please call our office to schedule the follow-up appointment.  If you need a refill on your cardiac medications before your next appointment, please call your pharmacy.

## 2015-12-08 DIAGNOSIS — M7671 Peroneal tendinitis, right leg: Secondary | ICD-10-CM | POA: Diagnosis not present

## 2015-12-08 DIAGNOSIS — M6701 Short Achilles tendon (acquired), right ankle: Secondary | ICD-10-CM | POA: Diagnosis not present

## 2015-12-08 DIAGNOSIS — G8918 Other acute postprocedural pain: Secondary | ICD-10-CM | POA: Diagnosis not present

## 2015-12-08 DIAGNOSIS — M76821 Posterior tibial tendinitis, right leg: Secondary | ICD-10-CM | POA: Diagnosis not present

## 2015-12-08 DIAGNOSIS — M19071 Primary osteoarthritis, right ankle and foot: Secondary | ICD-10-CM | POA: Diagnosis not present

## 2015-12-08 DIAGNOSIS — M216X1 Other acquired deformities of right foot: Secondary | ICD-10-CM | POA: Diagnosis not present

## 2015-12-23 DIAGNOSIS — M6701 Short Achilles tendon (acquired), right ankle: Secondary | ICD-10-CM | POA: Diagnosis not present

## 2015-12-23 DIAGNOSIS — Z4789 Encounter for other orthopedic aftercare: Secondary | ICD-10-CM | POA: Diagnosis not present

## 2015-12-31 ENCOUNTER — Other Ambulatory Visit: Payer: Self-pay | Admitting: Cardiovascular Disease

## 2016-01-01 NOTE — Telephone Encounter (Signed)
Rx(s) sent to pharmacy electronically.  

## 2016-01-19 ENCOUNTER — Other Ambulatory Visit: Payer: Self-pay | Admitting: Cardiovascular Disease

## 2016-01-19 NOTE — Telephone Encounter (Signed)
Rx has been sent to the pharmacy electronically. ° °

## 2016-01-20 DIAGNOSIS — M6701 Short Achilles tendon (acquired), right ankle: Secondary | ICD-10-CM | POA: Diagnosis not present

## 2016-01-20 DIAGNOSIS — Z4789 Encounter for other orthopedic aftercare: Secondary | ICD-10-CM | POA: Diagnosis not present

## 2016-02-02 DIAGNOSIS — H5203 Hypermetropia, bilateral: Secondary | ICD-10-CM | POA: Diagnosis not present

## 2016-02-02 DIAGNOSIS — H2512 Age-related nuclear cataract, left eye: Secondary | ICD-10-CM | POA: Diagnosis not present

## 2016-02-02 DIAGNOSIS — H52223 Regular astigmatism, bilateral: Secondary | ICD-10-CM | POA: Diagnosis not present

## 2016-02-02 DIAGNOSIS — H524 Presbyopia: Secondary | ICD-10-CM | POA: Diagnosis not present

## 2016-02-02 DIAGNOSIS — H25811 Combined forms of age-related cataract, right eye: Secondary | ICD-10-CM | POA: Diagnosis not present

## 2016-02-22 DIAGNOSIS — M6701 Short Achilles tendon (acquired), right ankle: Secondary | ICD-10-CM | POA: Diagnosis not present

## 2016-02-22 DIAGNOSIS — Z4789 Encounter for other orthopedic aftercare: Secondary | ICD-10-CM | POA: Diagnosis not present

## 2016-03-21 DIAGNOSIS — M6701 Short Achilles tendon (acquired), right ankle: Secondary | ICD-10-CM | POA: Diagnosis not present

## 2016-03-21 DIAGNOSIS — Z4789 Encounter for other orthopedic aftercare: Secondary | ICD-10-CM | POA: Diagnosis not present

## 2016-04-13 DIAGNOSIS — I10 Essential (primary) hypertension: Secondary | ICD-10-CM | POA: Diagnosis not present

## 2016-04-13 DIAGNOSIS — L570 Actinic keratosis: Secondary | ICD-10-CM | POA: Diagnosis not present

## 2016-04-13 DIAGNOSIS — Z85828 Personal history of other malignant neoplasm of skin: Secondary | ICD-10-CM | POA: Diagnosis not present

## 2016-04-13 DIAGNOSIS — Z1283 Encounter for screening for malignant neoplasm of skin: Secondary | ICD-10-CM | POA: Diagnosis not present

## 2016-04-13 DIAGNOSIS — D229 Melanocytic nevi, unspecified: Secondary | ICD-10-CM | POA: Diagnosis not present

## 2016-04-13 DIAGNOSIS — L821 Other seborrheic keratosis: Secondary | ICD-10-CM | POA: Diagnosis not present

## 2016-04-13 DIAGNOSIS — D1800 Hemangioma unspecified site: Secondary | ICD-10-CM | POA: Diagnosis not present

## 2016-08-11 DIAGNOSIS — M7062 Trochanteric bursitis, left hip: Secondary | ICD-10-CM | POA: Diagnosis not present

## 2016-10-25 DIAGNOSIS — Z23 Encounter for immunization: Secondary | ICD-10-CM | POA: Diagnosis not present

## 2016-10-25 DIAGNOSIS — E785 Hyperlipidemia, unspecified: Secondary | ICD-10-CM | POA: Diagnosis not present

## 2016-10-25 DIAGNOSIS — I10 Essential (primary) hypertension: Secondary | ICD-10-CM | POA: Diagnosis not present

## 2016-10-25 DIAGNOSIS — Z0000 Encounter for general adult medical examination without abnormal findings: Secondary | ICD-10-CM | POA: Diagnosis not present

## 2016-10-25 DIAGNOSIS — K219 Gastro-esophageal reflux disease without esophagitis: Secondary | ICD-10-CM | POA: Diagnosis not present

## 2017-02-01 ENCOUNTER — Other Ambulatory Visit: Payer: Self-pay | Admitting: Cardiovascular Disease

## 2017-02-01 ENCOUNTER — Telehealth: Payer: Self-pay | Admitting: *Deleted

## 2017-02-01 DIAGNOSIS — G453 Amaurosis fugax: Secondary | ICD-10-CM

## 2017-02-01 DIAGNOSIS — H52223 Regular astigmatism, bilateral: Secondary | ICD-10-CM | POA: Diagnosis not present

## 2017-02-01 DIAGNOSIS — H5203 Hypermetropia, bilateral: Secondary | ICD-10-CM | POA: Diagnosis not present

## 2017-02-01 DIAGNOSIS — H25811 Combined forms of age-related cataract, right eye: Secondary | ICD-10-CM | POA: Diagnosis not present

## 2017-02-01 DIAGNOSIS — H3401 Transient retinal artery occlusion, right eye: Secondary | ICD-10-CM | POA: Diagnosis not present

## 2017-02-01 DIAGNOSIS — H43812 Vitreous degeneration, left eye: Secondary | ICD-10-CM | POA: Diagnosis not present

## 2017-02-01 DIAGNOSIS — H2512 Age-related nuclear cataract, left eye: Secondary | ICD-10-CM | POA: Diagnosis not present

## 2017-02-01 DIAGNOSIS — H524 Presbyopia: Secondary | ICD-10-CM | POA: Diagnosis not present

## 2017-02-01 NOTE — Telephone Encounter (Signed)
Thank you MCr 

## 2017-02-01 NOTE — Telephone Encounter (Signed)
Received call from Dr Jerline Pain with [facility]. Patient in office today with typical amaurosis fugax symptoms, eye looks ok but she would like patient to have carotid duplex. Discussed with Dr Oval Linsey and ok to order.  Order placed and sent to scheduling so appointment could be arranged.

## 2017-02-15 NOTE — Telephone Encounter (Signed)
Patient scheduled for 12/21/17

## 2017-02-28 ENCOUNTER — Other Ambulatory Visit: Payer: Self-pay | Admitting: Cardiovascular Disease

## 2017-02-28 DIAGNOSIS — M1712 Unilateral primary osteoarthritis, left knee: Secondary | ICD-10-CM | POA: Diagnosis not present

## 2017-02-28 DIAGNOSIS — Z96651 Presence of right artificial knee joint: Secondary | ICD-10-CM | POA: Diagnosis not present

## 2017-02-28 DIAGNOSIS — M25561 Pain in right knee: Secondary | ICD-10-CM | POA: Diagnosis not present

## 2017-03-02 ENCOUNTER — Other Ambulatory Visit: Payer: Self-pay | Admitting: *Deleted

## 2017-03-02 ENCOUNTER — Ambulatory Visit (INDEPENDENT_AMBULATORY_CARE_PROVIDER_SITE_OTHER): Payer: Medicare Other | Admitting: Vascular Surgery

## 2017-03-02 ENCOUNTER — Encounter: Payer: Self-pay | Admitting: *Deleted

## 2017-03-02 ENCOUNTER — Encounter (HOSPITAL_COMMUNITY): Payer: Self-pay

## 2017-03-02 ENCOUNTER — Ambulatory Visit (HOSPITAL_COMMUNITY)
Admission: RE | Admit: 2017-03-02 | Discharge: 2017-03-02 | Disposition: A | Discharge status: Home / Self Care | Payer: Medicare Other | Source: Ambulatory Visit | Attending: Cardiology | Admitting: Cardiology

## 2017-03-02 ENCOUNTER — Other Ambulatory Visit: Payer: Self-pay | Admitting: Cardiology

## 2017-03-02 ENCOUNTER — Telehealth: Payer: Self-pay | Admitting: Surgery

## 2017-03-02 ENCOUNTER — Encounter: Payer: Self-pay | Admitting: Vascular Surgery

## 2017-03-02 VITALS — BP 128/80 | HR 75 | Resp 20 | Ht 69.0 in | Wt 190.0 lb

## 2017-03-02 DIAGNOSIS — I6523 Occlusion and stenosis of bilateral carotid arteries: Secondary | ICD-10-CM | POA: Diagnosis not present

## 2017-03-02 DIAGNOSIS — G453 Amaurosis fugax: Secondary | ICD-10-CM

## 2017-03-02 NOTE — H&P (View-Only) (Signed)
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 male referred by Dr. 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  He describes these as a kaleidoscope seen in one eye or the other intermittently.  These usually resolve with Tylenol.  However, in mid December he had a new symptom where he lost vision in the lower field of his right eye and then this resolved after about 15 minutes.  He described this as a grayish color with a screen that covered the eye and then resolved.  He currently is on aspirin 81 mg once daily.  He also takes a statin intermittently but has some problems with fatigue related to this medication.  He denies any prior coronary events.  He had a negative stress test about 3 years ago.  He denies shortness of breath or chest pain.  He has had no prior stroke.  However, he did have an episode 26 years ago where he has 6 months of amnesia but it was never determined what caused this.  He has also had shingles in the right brain and ear in the past which caused some balance issues but this has essentially resolved.  He does have a family history of strokes in his father at age 45.  His mother also had a carotid endarterectomy in her 16s.  Other medical problems include hyperlipidemia and hypertension both of which are currently controlled.  Past Medical History:  Diagnosis Date  . Dyslipidemia   . PVC's (premature ventricular contractions)   . Systemic hypertension    Past Surgical History:  Procedure Laterality Date  . BUNIONECTOMY  1994   Left foot  . CARPAL TUNNEL RELEASE  1999   Left hand  . TOTAL KNEE ARTHROPLASTY  12/04/2010   Right    History reviewed. No pertinent  family history.  SOCIAL HISTORY: Social History   Socioeconomic History  . Marital status: Married    Spouse name: Not on file  . Number of children: Not on file  . Years of education: Not on file  . Highest education level: Not on file  Social Needs  . Financial resource strain: Not on file  . Food insecurity - worry: Not on file  . Food insecurity - inability: Not on file  . Transportation needs - medical: Not on file  . Transportation needs - non-medical: Not on file  Occupational History  . Not on file  Tobacco Use  . Smoking status: Never Smoker  . Smokeless tobacco: Never Used  Substance and Sexual Activity  . Alcohol use: Yes    Alcohol/week: 2.5 oz    Types: 5 Standard drinks or equivalent per week  . Drug use: No  . Sexual activity: Not on file  Other Topics Concern  . Not on file  Social History Narrative  . Not on file    Allergies  Allergen Reactions  . Amoxicillin Nausea Only  . Oxycodone Nausea Only  . Statins     Depression     Current Outpatient Medications  Medication Sig Dispense Refill  . amLODipine (NORVASC) 10 MG tablet TAKE 1 TABLET BY MOUTH EVERY DAY 90 tablet 3  . aspirin 81 MG tablet Take 81 mg by mouth  daily.    . CELEBREX 200 MG capsule 200 mg daily.    . cetirizine (ZYRTEC) 10 MG tablet Take 10 mg by mouth daily.    . fluticasone (FLONASE) 50 MCG/ACT nasal spray Place 2 sprays into both nostrils daily.    . hydrochlorothiazide (HYDRODIURIL) 25 MG tablet TAKE 1 TABLET BY MOUTH DAILY 90 tablet 0  . lisinopril (PRINIVIL,ZESTRIL) 20 MG tablet TAKE 1 TABLET BY MOUTH EVERY DAY 90 tablet 3  . pantoprazole (PROTONIX) 40 MG tablet Take 40 mg by mouth daily.    . pravastatin (PRAVACHOL) 40 MG tablet Take 1 tablet (40 mg total) by mouth every evening. 90 tablet 3   No current facility-administered medications for this visit.     ROS:   General:  No weight loss, Fever, chills  HEENT: No recent headaches, no nasal bleeding, no visual  changes, no sore throat  Neurologic: No dizziness, blackouts, seizures. No recent symptoms of stroke or mini- stroke. No recent episodes of slurred speech, + temporary blindness.  Cardiac: No recent episodes of chest pain/pressure, no shortness of breath at rest.  No shortness of breath with exertion.  Denies history of atrial fibrillation or irregular heartbeat  Vascular: No history of rest pain in feet.  No history of claudication.  No history of non-healing ulcer, No history of DVT   Pulmonary: No home oxygen, no productive cough, no hemoptysis,  No asthma or wheezing  Musculoskeletal:  [X]  Arthritis, [ ]  Low back pain,  [X]  Joint pain  Hematologic:No history of hypercoagulable state.  No history of easy bleeding.  No history of anemia  Gastrointestinal: No hematochezia or melena,  No gastroesophageal reflux, no trouble swallowing  Urinary: [ ]  chronic Kidney disease, [ ]  on HD - [ ]  MWF or [ ]  TTHS, [ ]  Burning with urination, [ ]  Frequent urination, [ ]  Difficulty urinating;   Skin: No rashes  Psychological: No history of anxiety,  No history of depression   Physical Examination  Vitals:   03/02/17 1551 03/02/17 1553  BP: (!) 150/85 128/80  Pulse: 75   Resp: 20   SpO2: 97%   Weight: 190 lb (86.2 kg)   Height: 5\' 9"  (1.753 m)     Body mass index is 28.06 kg/m.  General:  Alert and oriented, no acute distress HEENT: Normal Neck: No JVD, right side high-pitched bruit no left bruit Pulmonary: Clear to auscultation bilaterally Cardiac: Regular Rate and Rhythm without murmur Abdomen: Soft, non-tender, non-distended, no mass Skin: No rash Extremity Pulses:  2+ radial, brachial, femoral, popliteal absent dorsalis pedis, posterior tibial pulses bilaterally Musculoskeletal: No deformity or edema  Neurologic: Upper and lower extremity motor 5/5 and symmetric, no facial asymmetry  DATA: Vision had bilateral carotid duplex performed at [facility] today.  This showed  greater than 80% stenosis bilaterally.  There was also suggestion of left subclavian artery stenosis.   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  Symptomatic with amaurosis in the right eye.  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   PLAN: #1 right carotid endarterectomy on Monday, March 06, 2017.  Risk benefits possible complications of procedure details including but not limited to bleeding infection stroke risk 1-2% myocardial events 5% cranial nerve injury 5-10% patient understands and agrees to proceed.  We will increase his aspirin to 1 adult aspirin daily starting today.  He will continue his statin.  Greater than 80% left carotid stenosis we will repeat his carotid duplex scan after  he returns for postoperative follow-up from his right side and consider whether or not to do interval left carotid endarterectomy.  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   Ruta Hinds, MD [facility] [place] Office: [phone] Pager: [phone]

## 2017-03-02 NOTE — Progress Notes (Unsigned)
Mr. Antrim had a carotid ultrasound at [facility] [address] for amaurosis fugax. Mr. Rebert stated that he has viisual disturbance in his right eye. He describes it as a gray shading in his visual field. This has been coming and going since December 2018. His carotid duplex showed a 80-99% stenosis in the bilateral internal carotid arteries. The results were given to Dr. Martinique at [facility] and he referred him to Vein [facility] Surgery ASAP.

## 2017-03-02 NOTE — Progress Notes (Signed)
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 male referred by Dr. 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  He describes these as a kaleidoscope seen in one eye or the other intermittently.  These usually resolve with Tylenol.  However, in mid December he had a new symptom where he lost vision in the lower field of his right eye and then this resolved after about 15 minutes.  He described this as a grayish color with a screen that covered the eye and then resolved.  He currently is on aspirin 81 mg once daily.  He also takes a statin intermittently but has some problems with fatigue related to this medication.  He denies any prior coronary events.  He had a negative stress test about 3 years ago.  He denies shortness of breath or chest pain.  He has had no prior stroke.  However, he did have an episode 26 years ago where he has 6 months of amnesia but it was never determined what caused this.  He has also had shingles in the right brain and ear in the past which caused some balance issues but this has essentially resolved.  He does have a family history of strokes in his father at age 36.  His mother also had a carotid endarterectomy in her 7s.  Other medical problems include hyperlipidemia and hypertension both of which are currently controlled.  Past Medical History:  Diagnosis Date  . Dyslipidemia   . PVC's (premature ventricular contractions)   . Systemic hypertension    Past Surgical History:  Procedure Laterality Date  . BUNIONECTOMY  1994   Left foot  . CARPAL TUNNEL RELEASE  1999   Left hand  . TOTAL KNEE ARTHROPLASTY  12/04/2010   Right    History reviewed. No pertinent  family history.  SOCIAL HISTORY: Social History   Socioeconomic History  . Marital status: Married    Spouse name: Not on file  . Number of children: Not on file  . Years of education: Not on file  . Highest education level: Not on file  Social Needs  . Financial resource strain: Not on file  . Food insecurity - worry: Not on file  . Food insecurity - inability: Not on file  . Transportation needs - medical: Not on file  . Transportation needs - non-medical: Not on file  Occupational History  . Not on file  Tobacco Use  . Smoking status: Never Smoker  . Smokeless tobacco: Never Used  Substance and Sexual Activity  . Alcohol use: Yes    Alcohol/week: 2.5 oz    Types: 5 Standard drinks or equivalent per week  . Drug use: No  . Sexual activity: Not on file  Other Topics Concern  . Not on file  Social History Narrative  . Not on file    Allergies  Allergen Reactions  . Amoxicillin Nausea Only  . Oxycodone Nausea Only  . Statins     Depression     Current Outpatient Medications  Medication Sig Dispense Refill  . amLODipine (NORVASC) 10 MG tablet TAKE 1 TABLET BY MOUTH EVERY DAY 90 tablet 3  . aspirin 81 MG tablet Take 81 mg by mouth  daily.    . CELEBREX 200 MG capsule 200 mg daily.    . cetirizine (ZYRTEC) 10 MG tablet Take 10 mg by mouth daily.    . fluticasone (FLONASE) 50 MCG/ACT nasal spray Place 2 sprays into both nostrils daily.    . hydrochlorothiazide (HYDRODIURIL) 25 MG tablet TAKE 1 TABLET BY MOUTH DAILY 90 tablet 0  . lisinopril (PRINIVIL,ZESTRIL) 20 MG tablet TAKE 1 TABLET BY MOUTH EVERY DAY 90 tablet 3  . pantoprazole (PROTONIX) 40 MG tablet Take 40 mg by mouth daily.    . pravastatin (PRAVACHOL) 40 MG tablet Take 1 tablet (40 mg total) by mouth every evening. 90 tablet 3   No current facility-administered medications for this visit.     ROS:   General:  No weight loss, Fever, chills  HEENT: No recent headaches, no nasal bleeding, no visual  changes, no sore throat  Neurologic: No dizziness, blackouts, seizures. No recent symptoms of stroke or mini- stroke. No recent episodes of slurred speech, + temporary blindness.  Cardiac: No recent episodes of chest pain/pressure, no shortness of breath at rest.  No shortness of breath with exertion.  Denies history of atrial fibrillation or irregular heartbeat  Vascular: No history of rest pain in feet.  No history of claudication.  No history of non-healing ulcer, No history of DVT   Pulmonary: No home oxygen, no productive cough, no hemoptysis,  No asthma or wheezing  Musculoskeletal:  [X]  Arthritis, [ ]  Low back pain,  [X]  Joint pain  Hematologic:No history of hypercoagulable state.  No history of easy bleeding.  No history of anemia  Gastrointestinal: No hematochezia or melena,  No gastroesophageal reflux, no trouble swallowing  Urinary: [ ]  chronic Kidney disease, [ ]  on HD - [ ]  MWF or [ ]  TTHS, [ ]  Burning with urination, [ ]  Frequent urination, [ ]  Difficulty urinating;   Skin: No rashes  Psychological: No history of anxiety,  No history of depression   Physical Examination  Vitals:   03/02/17 1551 03/02/17 1553  BP: (!) 150/85 128/80  Pulse: 75   Resp: 20   SpO2: 97%   Weight: 190 lb (86.2 kg)   Height: 5\' 9"  (1.753 m)     Body mass index is 28.06 kg/m.  General:  Alert and oriented, no acute distress HEENT: Normal Neck: No JVD, right side high-pitched bruit no left bruit Pulmonary: Clear to auscultation bilaterally Cardiac: Regular Rate and Rhythm without murmur Abdomen: Soft, non-tender, non-distended, no mass Skin: No rash Extremity Pulses:  2+ radial, brachial, femoral, popliteal absent dorsalis pedis, posterior tibial pulses bilaterally Musculoskeletal: No deformity or edema  Neurologic: Upper and lower extremity motor 5/5 and symmetric, no facial asymmetry  DATA: Vision had bilateral carotid duplex performed at [facility] today.  This showed  greater than 80% stenosis bilaterally.  There was also suggestion of left subclavian artery stenosis.   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  Symptomatic with amaurosis in the right eye.  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   PLAN: #1 right carotid endarterectomy on Monday, March 06, 2017.  Risk benefits possible complications of procedure details including but not limited to bleeding infection stroke risk 1-2% myocardial events 5% cranial nerve injury 5-10% patient understands and agrees to proceed.  We will increase his aspirin to 1 adult aspirin daily starting today.  He will continue his statin.  Greater than 80% left carotid stenosis we will repeat his carotid duplex scan after  he returns for postoperative follow-up from his right side and consider whether or not to do interval left carotid endarterectomy.  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   Ruta Hinds, MD [facility] [place] Office: [phone] Pager: [phone]

## 2017-03-02 NOTE — Telephone Encounter (Signed)
Dee-Dee from [facility] called to sch a new pt an ASAP appt. Pt had a carotid doppler 03/02/17 - 80-99% bilat ICA stenosis.  I told her that I could offer her the first available which is 03/27/17 with [facility] and to consult with the MD there. If that date is not satisfactory, to call us back and for the MD to do a peer to peer.  Also please let them know that we would need to repeat the carotid doppler.

## 2017-03-03 ENCOUNTER — Other Ambulatory Visit: Payer: Self-pay

## 2017-03-03 ENCOUNTER — Encounter (HOSPITAL_COMMUNITY): Payer: Self-pay | Admitting: *Deleted

## 2017-03-03 NOTE — Progress Notes (Signed)
Spoke with pt for pre-op call. Pt denies cardiac history, chest pain or sob. Pt states he does not have diabetes.

## 2017-03-06 ENCOUNTER — Encounter (HOSPITAL_COMMUNITY): Payer: Self-pay

## 2017-03-06 ENCOUNTER — Inpatient Hospital Stay (HOSPITAL_COMMUNITY)
Admission: RE | Admit: 2017-03-06 | Discharge: 2017-03-07 | DRG: 039 | Disposition: A | Discharge status: Home / Self Care | Payer: Medicare Other | Source: Ambulatory Visit | Attending: Vascular Surgery | Admitting: Vascular Surgery

## 2017-03-06 ENCOUNTER — Encounter (HOSPITAL_COMMUNITY)
Admission: RE | Disposition: A | Discharge status: Home / Self Care | Payer: Self-pay | Source: Ambulatory Visit | Attending: Vascular Surgery

## 2017-03-06 ENCOUNTER — Inpatient Hospital Stay (HOSPITAL_COMMUNITY): Payer: Medicare Other | Admitting: Certified Registered Nurse Anesthetist

## 2017-03-06 ENCOUNTER — Other Ambulatory Visit: Payer: Self-pay

## 2017-03-06 DIAGNOSIS — Z87442 Personal history of urinary calculi: Secondary | ICD-10-CM

## 2017-03-06 DIAGNOSIS — I10 Essential (primary) hypertension: Secondary | ICD-10-CM | POA: Diagnosis present

## 2017-03-06 DIAGNOSIS — K219 Gastro-esophageal reflux disease without esophagitis: Secondary | ICD-10-CM | POA: Diagnosis present

## 2017-03-06 DIAGNOSIS — R490 Dysphonia: Secondary | ICD-10-CM | POA: Diagnosis not present

## 2017-03-06 DIAGNOSIS — Z888 Allergy status to other drugs, medicaments and biological substances status: Secondary | ICD-10-CM | POA: Diagnosis not present

## 2017-03-06 DIAGNOSIS — G43109 Migraine with aura, not intractable, without status migrainosus: Secondary | ICD-10-CM | POA: Diagnosis present

## 2017-03-06 DIAGNOSIS — E785 Hyperlipidemia, unspecified: Secondary | ICD-10-CM | POA: Diagnosis present

## 2017-03-06 DIAGNOSIS — I6523 Occlusion and stenosis of bilateral carotid arteries: Secondary | ICD-10-CM | POA: Diagnosis present

## 2017-03-06 DIAGNOSIS — Z96651 Presence of right artificial knee joint: Secondary | ICD-10-CM | POA: Diagnosis present

## 2017-03-06 DIAGNOSIS — Z79899 Other long term (current) drug therapy: Secondary | ICD-10-CM | POA: Diagnosis not present

## 2017-03-06 DIAGNOSIS — I493 Ventricular premature depolarization: Secondary | ICD-10-CM | POA: Diagnosis not present

## 2017-03-06 DIAGNOSIS — H547 Unspecified visual loss: Secondary | ICD-10-CM | POA: Diagnosis present

## 2017-03-06 DIAGNOSIS — Z881 Allergy status to other antibiotic agents status: Secondary | ICD-10-CM

## 2017-03-06 DIAGNOSIS — I6521 Occlusion and stenosis of right carotid artery: Secondary | ICD-10-CM | POA: Diagnosis present

## 2017-03-06 DIAGNOSIS — M1990 Unspecified osteoarthritis, unspecified site: Secondary | ICD-10-CM | POA: Diagnosis present

## 2017-03-06 DIAGNOSIS — I739 Peripheral vascular disease, unspecified: Secondary | ICD-10-CM | POA: Diagnosis present

## 2017-03-06 DIAGNOSIS — Z823 Family history of stroke: Secondary | ICD-10-CM

## 2017-03-06 DIAGNOSIS — Z7982 Long term (current) use of aspirin: Secondary | ICD-10-CM

## 2017-03-06 HISTORY — DX: Gastro-esophageal reflux disease without esophagitis: K21.9

## 2017-03-06 HISTORY — DX: Peripheral vascular disease, unspecified: I73.9

## 2017-03-06 HISTORY — PX: ENDARTERECTOMY: SHX5162

## 2017-03-06 HISTORY — DX: Headache: R51

## 2017-03-06 HISTORY — DX: Personal history of urinary calculi: Z87.442

## 2017-03-06 HISTORY — DX: Transient global amnesia: G45.4

## 2017-03-06 HISTORY — DX: Unspecified osteoarthritis, unspecified site: M19.90

## 2017-03-06 HISTORY — DX: Headache, unspecified: R51.9

## 2017-03-06 LAB — COMPREHENSIVE METABOLIC PANEL
ALBUMIN: 3.9 g/dL (ref 3.5–5.0)
ALK PHOS: 53 U/L (ref 38–126)
ALT: 21 U/L (ref 17–63)
AST: 23 U/L (ref 15–41)
Anion gap: 13 (ref 5–15)
BUN: 23 mg/dL — ABNORMAL HIGH (ref 6–20)
CHLORIDE: 100 mmol/L — AB (ref 101–111)
CO2: 22 mmol/L (ref 22–32)
CREATININE: 1.27 mg/dL — AB (ref 0.61–1.24)
Calcium: 9.5 mg/dL (ref 8.9–10.3)
GFR calc Af Amer: >60 mL/min (ref >60)
GFR calc non Af Amer: 54 mL/min — ABNORMAL LOW (ref >60)
GLUCOSE: 95 mg/dL (ref 65–99)
Potassium: 4.1 mmol/L (ref 3.5–5.1)
SODIUM: 135 mmol/L (ref 135–145)
Total Bilirubin: 0.9 mg/dL (ref 0.3–1.2)
Total Protein: 6.8 g/dL (ref 6.5–8.1)

## 2017-03-06 LAB — CBC
HCT: 46.2 % (ref 39.0–52.0)
HEMOGLOBIN: 16 g/dL (ref 13.0–17.0)
MCH: 30.2 pg (ref 26.0–34.0)
MCHC: 34.6 g/dL (ref 30.0–36.0)
MCV: 87.2 fL (ref 78.0–100.0)
PLATELETS: 202 10*3/uL (ref 150–400)
RBC: 5.3 MIL/uL (ref 4.22–5.81)
RDW: 13.3 % (ref 11.5–15.5)
WBC: 9.8 10*3/uL (ref 4.0–10.5)

## 2017-03-06 LAB — APTT: APTT: 32 s (ref 24–36)

## 2017-03-06 LAB — TYPE AND SCREEN
ABO/RH(D): A POS
ANTIBODY SCREEN: NEGATIVE

## 2017-03-06 LAB — PROTIME-INR
INR: 0.98
Prothrombin Time: 12.9 seconds (ref 11.4–15.2)

## 2017-03-06 SURGERY — ENDARTERECTOMY, CAROTID
Anesthesia: General | Laterality: Right

## 2017-03-06 MED ORDER — PROTAMINE SULFATE 10 MG/ML IV SOLN
INTRAVENOUS | Status: DC | PRN
  Administered 2017-03-06 (×2): 20 mg via INTRAVENOUS
  Administered 2017-03-06: 10 mg via INTRAVENOUS

## 2017-03-06 MED ORDER — ASPIRIN 81 MG PO CHEW
81.0000 mg | CHEWABLE_TABLET | Freq: Every day | ORAL | Status: DC
  Administered 2017-03-07: 81 mg via ORAL
  Filled 2017-03-06: qty 1

## 2017-03-06 MED ORDER — VANCOMYCIN HCL IN DEXTROSE 1-5 GM/200ML-% IV SOLN
1000.0000 mg | Freq: Two times a day (BID) | INTRAVENOUS | Status: DC
  Administered 2017-03-07: 1000 mg via INTRAVENOUS
  Filled 2017-03-06 (×2): qty 200

## 2017-03-06 MED ORDER — LISINOPRIL 10 MG PO TABS
20.0000 mg | ORAL_TABLET | Freq: Every day | ORAL | Status: DC
  Administered 2017-03-07: 20 mg via ORAL
  Filled 2017-03-06: qty 2

## 2017-03-06 MED ORDER — GUAIFENESIN-DM 100-10 MG/5ML PO SYRP: 15.0000 mL | ORAL_SOLUTION | ORAL | Status: DC | PRN

## 2017-03-06 MED ORDER — MAGNESIUM SULFATE 2 GM/50ML IV SOLN: 2.0000 g | Freq: Every day | INTRAVENOUS | Status: DC | PRN

## 2017-03-06 MED ORDER — BISACODYL 10 MG RE SUPP: 10.0000 mg | Freq: Every day | RECTAL | Status: DC | PRN

## 2017-03-06 MED ORDER — SODIUM CHLORIDE 0.9 % IV SOLN
500.0000 mL | Freq: Once | INTRAVENOUS | Status: AC | PRN
  Administered 2017-03-06: 500 mL via INTRAVENOUS

## 2017-03-06 MED ORDER — PHENYLEPHRINE HCL 10 MG/ML IJ SOLN
INTRAVENOUS | Status: DC | PRN
  Administered 2017-03-06: 15 ug/min via INTRAVENOUS

## 2017-03-06 MED ORDER — DEXAMETHASONE SODIUM PHOSPHATE 10 MG/ML IJ SOLN
INTRAMUSCULAR | Status: DC | PRN
  Administered 2017-03-06: 10 mg via INTRAVENOUS

## 2017-03-06 MED ORDER — LIDOCAINE HCL (PF) 1 % IJ SOLN
INTRAMUSCULAR | Status: AC
  Filled 2017-03-06: qty 30

## 2017-03-06 MED ORDER — SODIUM CHLORIDE 0.9 % IV SOLN
INTRAVENOUS | Status: DC
  Administered 2017-03-06: 19:00:00 via INTRAVENOUS

## 2017-03-06 MED ORDER — EPHEDRINE SULFATE-NACL 50-0.9 MG/10ML-% IV SOSY
PREFILLED_SYRINGE | INTRAVENOUS | Status: DC | PRN
  Administered 2017-03-06: 5 mg via INTRAVENOUS

## 2017-03-06 MED ORDER — PANTOPRAZOLE SODIUM 40 MG PO TBEC: 40.0000 mg | DELAYED_RELEASE_TABLET | Freq: Every day | ORAL | Status: DC

## 2017-03-06 MED ORDER — POLYETHYLENE GLYCOL 3350 17 G PO PACK: 17.0000 g | PACK | Freq: Every day | ORAL | Status: DC | PRN

## 2017-03-06 MED ORDER — 0.9 % SODIUM CHLORIDE (POUR BTL) OPTIME
TOPICAL | Status: DC | PRN
Start: 2017-03-06 — End: 2017-03-06
  Administered 2017-03-06: 2000 mL

## 2017-03-06 MED ORDER — HYDROCHLOROTHIAZIDE 25 MG PO TABS
25.0000 mg | ORAL_TABLET | Freq: Every day | ORAL | Status: DC
  Administered 2017-03-07: 25 mg via ORAL
  Filled 2017-03-06: qty 1

## 2017-03-06 MED ORDER — PHENYLEPHRINE HCL 10 MG/ML IJ SOLN
INTRAMUSCULAR | Status: DC | PRN
  Administered 2017-03-06 (×2): 40 ug via INTRAVENOUS
  Administered 2017-03-06: 80 ug via INTRAVENOUS
  Administered 2017-03-06: 120 ug via INTRAVENOUS

## 2017-03-06 MED ORDER — SUGAMMADEX SODIUM 200 MG/2ML IV SOLN
INTRAVENOUS | Status: DC | PRN
  Administered 2017-03-06: 200 mg via INTRAVENOUS

## 2017-03-06 MED ORDER — POTASSIUM CHLORIDE CRYS ER 20 MEQ PO TBCR: 20.0000 meq | EXTENDED_RELEASE_TABLET | Freq: Every day | ORAL | Status: DC | PRN

## 2017-03-06 MED ORDER — PROPOFOL 10 MG/ML IV BOLUS
INTRAVENOUS | Status: DC | PRN
  Administered 2017-03-06: 30 mg via INTRAVENOUS
  Administered 2017-03-06: 20 mg via INTRAVENOUS
  Administered 2017-03-06: 70 mg via INTRAVENOUS
  Administered 2017-03-06: 20 mg via INTRAVENOUS

## 2017-03-06 MED ORDER — FENTANYL CITRATE (PF) 100 MCG/2ML IJ SOLN: 25.0000 ug | INTRAMUSCULAR | Status: DC | PRN

## 2017-03-06 MED ORDER — CHLORHEXIDINE GLUCONATE 4 % EX LIQD: 60.0000 mL | Freq: Once | CUTANEOUS | Status: DC

## 2017-03-06 MED ORDER — PROPOFOL 10 MG/ML IV BOLUS
INTRAVENOUS | Status: AC
  Filled 2017-03-06: qty 20

## 2017-03-06 MED ORDER — ONDANSETRON HCL 4 MG/2ML IJ SOLN
INTRAMUSCULAR | Status: DC | PRN
  Administered 2017-03-06: 4 mg via INTRAVENOUS

## 2017-03-06 MED ORDER — LACTATED RINGERS IV SOLN
INTRAVENOUS | Status: DC | PRN
  Administered 2017-03-06 (×2): via INTRAVENOUS

## 2017-03-06 MED ORDER — HYDROCODONE-ACETAMINOPHEN 7.5-325 MG PO TABS: 1.0000 | ORAL_TABLET | Freq: Once | ORAL | Status: DC | PRN

## 2017-03-06 MED ORDER — LORATADINE 10 MG PO TABS
10.0000 mg | ORAL_TABLET | Freq: Every day | ORAL | Status: DC
  Administered 2017-03-07: 10 mg via ORAL
  Filled 2017-03-06: qty 1

## 2017-03-06 MED ORDER — ROCURONIUM BROMIDE 10 MG/ML (PF) SYRINGE
PREFILLED_SYRINGE | INTRAVENOUS | Status: DC | PRN
  Administered 2017-03-06: 60 mg via INTRAVENOUS
  Administered 2017-03-06 (×2): 20 mg via INTRAVENOUS

## 2017-03-06 MED ORDER — SODIUM CHLORIDE 0.9 % IV SOLN
INTRAVENOUS | Status: DC | PRN
  Administered 2017-03-06: 500 mL

## 2017-03-06 MED ORDER — MORPHINE SULFATE (PF) 2 MG/ML IV SOLN: 2.0000 mg | INTRAVENOUS | Status: DC | PRN

## 2017-03-06 MED ORDER — FENTANYL CITRATE (PF) 250 MCG/5ML IJ SOLN
INTRAMUSCULAR | Status: DC | PRN
  Administered 2017-03-06 (×2): 50 ug via INTRAVENOUS
  Administered 2017-03-06: 150 ug via INTRAVENOUS

## 2017-03-06 MED ORDER — DOCUSATE SODIUM 100 MG PO CAPS
100.0000 mg | ORAL_CAPSULE | Freq: Every day | ORAL | Status: DC
  Administered 2017-03-07: 100 mg via ORAL
  Filled 2017-03-06: qty 1

## 2017-03-06 MED ORDER — PANTOPRAZOLE SODIUM 40 MG PO TBEC
40.0000 mg | DELAYED_RELEASE_TABLET | Freq: Every day | ORAL | Status: DC
  Administered 2017-03-07: 40 mg via ORAL
  Filled 2017-03-06: qty 1

## 2017-03-06 MED ORDER — LIDOCAINE 2% (20 MG/ML) 5 ML SYRINGE
INTRAMUSCULAR | Status: DC | PRN
  Administered 2017-03-06: 60 mg via INTRAVENOUS

## 2017-03-06 MED ORDER — PHENYLEPHRINE 40 MCG/ML (10ML) SYRINGE FOR IV PUSH (FOR BLOOD PRESSURE SUPPORT)
PREFILLED_SYRINGE | INTRAVENOUS | Status: DC | PRN
  Administered 2017-03-06: 80 ug via INTRAVENOUS
  Administered 2017-03-06: 120 ug via INTRAVENOUS

## 2017-03-06 MED ORDER — ALUM & MAG HYDROXIDE-SIMETH 200-200-20 MG/5ML PO SUSP: 15.0000 mL | ORAL | Status: DC | PRN

## 2017-03-06 MED ORDER — SODIUM CHLORIDE 0.9 % IV SOLN: INTRAVENOUS | Status: DC

## 2017-03-06 MED ORDER — LABETALOL HCL 5 MG/ML IV SOLN: 10.0000 mg | INTRAVENOUS | Status: DC | PRN

## 2017-03-06 MED ORDER — VANCOMYCIN HCL 1000 MG IV SOLR
INTRAVENOUS | Status: DC | PRN
  Administered 2017-03-06: 1000 mg via INTRAVENOUS

## 2017-03-06 MED ORDER — ACETAMINOPHEN 650 MG RE SUPP: 325.0000 mg | RECTAL | Status: DC | PRN

## 2017-03-06 MED ORDER — ONDANSETRON HCL 4 MG/2ML IJ SOLN: 4.0000 mg | Freq: Four times a day (QID) | INTRAMUSCULAR | Status: DC | PRN

## 2017-03-06 MED ORDER — LACTATED RINGERS IV SOLN
INTRAVENOUS | Status: DC
  Administered 2017-03-06: 10:00:00 via INTRAVENOUS

## 2017-03-06 MED ORDER — HEPARIN SODIUM (PORCINE) 5000 UNIT/ML IJ SOLN: 5000.0000 [IU] | Freq: Three times a day (TID) | INTRAMUSCULAR | Status: DC

## 2017-03-06 MED ORDER — COENZYME Q10 200 MG PO CAPS: 200.0000 mg | ORAL_CAPSULE | Freq: Every day | ORAL | Status: DC

## 2017-03-06 MED ORDER — FENTANYL CITRATE (PF) 250 MCG/5ML IJ SOLN
INTRAMUSCULAR | Status: AC
  Filled 2017-03-06: qty 5

## 2017-03-06 MED ORDER — VANCOMYCIN HCL IN DEXTROSE 1-5 GM/200ML-% IV SOLN: 1000.0000 mg | INTRAVENOUS | Status: DC

## 2017-03-06 MED ORDER — HYDROCODONE-ACETAMINOPHEN 5-325 MG PO TABS: 1.0000 | ORAL_TABLET | ORAL | Status: DC | PRN

## 2017-03-06 MED ORDER — VANCOMYCIN HCL IN DEXTROSE 1-5 GM/200ML-% IV SOLN
INTRAVENOUS | Status: AC
  Filled 2017-03-06: qty 200

## 2017-03-06 MED ORDER — PHENOL 1.4 % MT LIQD: 1.0000 | OROMUCOSAL | Status: DC | PRN

## 2017-03-06 MED ORDER — METOPROLOL TARTRATE 5 MG/5ML IV SOLN: 2.0000 mg | INTRAVENOUS | Status: DC | PRN

## 2017-03-06 MED ORDER — AMLODIPINE BESYLATE 10 MG PO TABS
10.0000 mg | ORAL_TABLET | Freq: Every day | ORAL | Status: DC
  Administered 2017-03-07: 10 mg via ORAL
  Filled 2017-03-06: qty 1

## 2017-03-06 MED ORDER — HEPARIN SODIUM (PORCINE) 1000 UNIT/ML IJ SOLN
INTRAMUSCULAR | Status: DC | PRN
  Administered 2017-03-06: 9000 [IU] via INTRAVENOUS

## 2017-03-06 MED ORDER — HYDRALAZINE HCL 20 MG/ML IJ SOLN: 5.0000 mg | INTRAMUSCULAR | Status: DC | PRN

## 2017-03-06 MED ORDER — ACETAMINOPHEN 325 MG PO TABS: 325.0000 mg | ORAL_TABLET | ORAL | Status: DC | PRN

## 2017-03-06 SURGICAL SUPPLY — 45 items
CANISTER SUCT 3000ML PPV (MISCELLANEOUS) ×2 IMPLANT
CANNULA VESSEL 3MM 2 BLNT TIP (CANNULA) ×4 IMPLANT
CATH ROBINSON RED A/P 18FR (CATHETERS) ×2 IMPLANT
CLIP VESOCCLUDE MED 6/CT (CLIP) ×2 IMPLANT
CLIP VESOCCLUDE SM WIDE 6/CT (CLIP) ×2 IMPLANT
CRADLE DONUT ADULT HEAD (MISCELLANEOUS) ×2 IMPLANT
DECANTER SPIKE VIAL GLASS SM (MISCELLANEOUS) IMPLANT
DERMABOND ADVANCED (GAUZE/BANDAGES/DRESSINGS) ×1
DERMABOND ADVANCED .7 DNX12 (GAUZE/BANDAGES/DRESSINGS) ×1 IMPLANT
DRAIN HEMOVAC 1/8 X 5 (WOUND CARE) IMPLANT
ELECT REM PT RETURN 9FT ADLT (ELECTROSURGICAL) ×2
ELECTRODE REM PT RTRN 9FT ADLT (ELECTROSURGICAL) ×1 IMPLANT
EVACUATOR SILICONE 100CC (DRAIN) IMPLANT
GLOVE BIO SURGEON STRL SZ 6.5 (GLOVE) ×4 IMPLANT
GLOVE BIO SURGEON STRL SZ7.5 (GLOVE) ×4 IMPLANT
GLOVE BIOGEL PI IND STRL 6.5 (GLOVE) ×2 IMPLANT
GLOVE BIOGEL PI INDICATOR 6.5 (GLOVE) ×2
GLOVE SURG SS PI 6.5 STRL IVOR (GLOVE) ×2 IMPLANT
GOWN STRL REUS W/ TWL LRG LVL3 (GOWN DISPOSABLE) ×3 IMPLANT
GOWN STRL REUS W/ TWL XL LVL3 (GOWN DISPOSABLE) ×1 IMPLANT
GOWN STRL REUS W/TWL LRG LVL3 (GOWN DISPOSABLE) ×3
GOWN STRL REUS W/TWL XL LVL3 (GOWN DISPOSABLE) ×1
HEMOSTAT SPONGE AVITENE ULTRA (HEMOSTASIS) IMPLANT
KIT BASIN OR (CUSTOM PROCEDURE TRAY) ×2 IMPLANT
KIT ROOM TURNOVER OR (KITS) ×2 IMPLANT
KIT SHUNT ARGYLE CAROTID ART 6 (VASCULAR PRODUCTS) IMPLANT
LOOP VESSEL MINI RED (MISCELLANEOUS) ×2 IMPLANT
NEEDLE HYPO 25GX1X1/2 BEV (NEEDLE) IMPLANT
NS IRRIG 1000ML POUR BTL (IV SOLUTION) ×4 IMPLANT
PACK CAROTID (CUSTOM PROCEDURE TRAY) ×2 IMPLANT
PAD ARMBOARD 7.5X6 YLW CONV (MISCELLANEOUS) ×4 IMPLANT
PATCH HEMASHIELD 8X75 (Vascular Products) ×2 IMPLANT
SHUNT CAROTID BYPASS 10 (VASCULAR PRODUCTS) ×2 IMPLANT
SHUNT CAROTID BYPASS 12FRX15.5 (VASCULAR PRODUCTS) IMPLANT
SUT ETHILON 3 0 PS 1 (SUTURE) IMPLANT
SUT PROLENE 6 0 CC (SUTURE) ×2 IMPLANT
SUT PROLENE 7 0 BV1 MDA (SUTURE) ×2 IMPLANT
SUT SILK 3 0 TIES 17X18 (SUTURE)
SUT SILK 3-0 18XBRD TIE BLK (SUTURE) IMPLANT
SUT VIC AB 3-0 SH 27 (SUTURE) ×1
SUT VIC AB 3-0 SH 27X BRD (SUTURE) ×1 IMPLANT
SUT VICRYL 4-0 PS2 18IN ABS (SUTURE) ×2 IMPLANT
SYR CONTROL 10ML LL (SYRINGE) IMPLANT
TOWEL GREEN STERILE (TOWEL DISPOSABLE) ×2 IMPLANT
WATER STERILE IRR 1000ML POUR (IV SOLUTION) ×2 IMPLANT

## 2017-03-06 NOTE — Anesthesia Procedure Notes (Signed)
Arterial Line Insertion Start/End1/21/2019 10:40 AM Performed by: Valda Favia, CRNA, CRNA  Patient location: Pre-op. Preanesthetic checklist: patient identified, IV checked, site marked, risks and benefits discussed, surgical consent, monitors and equipment checked, pre-op evaluation, timeout performed and anesthesia consent Lidocaine 1% used for infiltration Left, radial was placed Catheter size: 20 G Hand hygiene performed , maximum sterile barriers used  and Seldinger technique used Allen's test indicative of satisfactory collateral circulation Attempts: 1 Procedure performed without using ultrasound guided technique. Following insertion, dressing applied and Biopatch. Post procedure assessment: normal  Patient tolerated the procedure well with no immediate complications.

## 2017-03-06 NOTE — Op Note (Signed)
Procedure: Right carotid endarterectomy  Preoperative diagnosis: >80% symptomatic right internal carotid artery stenosis  Postoperative diagnosis: Same  Anesthesia General  Asst.: Servando Snare, MD, Leontine Locket, [facility]  Operative findings: #1 greater than 80% right internal carotid stenosis                                                             #2 Dacron patch           #3 10 Fr shunt  Operative details: After obtaining informed consent, the patient was taken to the operating room. The patient was placed in a supine position on the operating room table. After induction of general anesthesia, the patient's entire neck and chest was prepped and draped in the usual sterile fashion. An oblique incision was made on the right aspect of the patient's neck anterior to the border the right sternocleidomastoid muscle. The incision was carried into the subcutaneous tissues and through the platysma. The sternocleidomastoid muscle was identified and reflected laterally.  The common carotid artery was then found at the base of the incision this was dissected free circumferentially. It was fairly soft on palpation.  The vagus nerve was identified and protected. Dissection was then carried up to the level carotid bifurcation. The hyperglossal nerve was above the primary area of dissection but I did have to put some traction on it as the internal was rotated behind the external which required a little more mobilization. The ansa cervicalis was divided at its insertion to the hypoglossal in order to provide exposure.  The internal carotid artery was dissected free circumferentially just below the level of the hypoglossal nerve and it was soft in character at this location and above any palpable disease. A vessel loop was placed around this. The vessel was fairly small. Next the external carotid and superior thyroid arteries were dissected free circumferentially and vessel loops were placed around these. A vessel  loop was also placed around an ascending pharyngeal branch.  The patient was given 9000 units of intravenous heparin.  After 2 minutes of circulation time and raising the mean arterial pressure to 90 mm mercury, the distal internal carotid artery was controlled with small bulldog clamp. The external carotid and superior thyroid arteries were controlled with vessel loops. The common carotid artery was controlled with a peripheral DeBakey clamp. A longitudinal opening was made in the common carotid artery just below the bifurcation. The arteriotomy was extended distally up into the internal carotid with Potts scissors. There was a large calcified plaque with greater than 80% stenosis in the internal carotid with some ulceration.  A 10 Fr shunt was brought onto the field and fashioned to fit the patient's artery.  This was threaded into the distal internal carotid artery and allowed to backbleed thoroughly.  There was good but not pulsatile backbleeding.  This was then threaded into the common carotid and secured with a Rummel tourniquet.   There was no air at this point and flow was restored to the brain.  Attention was then turned to the common carotid artery once again. A suitable endarterectomy plane was obtained and endarterectomy was begun in the common carotid artery and a good proximal endpoint was obtained. An eversion endarterectomy was performed on the external carotid artery and a good endpoint was obtained.  The plaque was then elevated in the internal carotid artery and a nice feathered distal endpoint was also obtained.  There was some lifting of the adventitia just after the bifurcation and this was tacked with several 7  0 prolene sutures.  The plaque was passed off the table. All loose debris was then removed from the carotid bed and everything was thoroughly irrigated with heparinized saline. A Dacron patch was then brought on to the operative field and this was sewn on as a patch angioplasty using a  running 6-0 Prolene suture. Prior to completion of the anastomosis the internal carotid artery was thoroughly backbled. This was then controlled again with a fine bulldog clamp.  The common carotid was thoroughly flushed forward. The external carotid was also thoroughly backbled.  The remainder of the patch was completed and the anastomosis was secured. Flow was then restored first retrograde from the external carotid into the carotid bed then antegrade from the common carotid to the external carotid artery and after approximately 5 cardiac cycles to the internal carotid artery. Doppler was used to evaluate the external/internal and common carotid arteries and these all had good Doppler flow. Hemostasis was obtained with 1 additional repair suture. The patient was also given 50 mg of Protamine.      The platysma muscle was reapproximated using a running 3-0 Vicryl suture. The skin was closed with 4 0 Vicryl subcuticular stitch.  The patient was awakened in the operating room and was moving upper and lower extremities symmetrically and following commands.  The patient was stable on arrival to the PACU.  Ruta Hinds, MD [facility] [place] Office: [phone] Pager: [phone]

## 2017-03-06 NOTE — Anesthesia Preprocedure Evaluation (Signed)
Anesthesia Evaluation  Patient identified by MRN, date of birth, ID band Patient awake    Reviewed: Allergy & Precautions, NPO status , Patient's Chart, lab work & pertinent test results  History of Anesthesia Complications Negative for: history of anesthetic complications  Airway Mallampati: II  TM Distance: >3 FB Neck ROM: Full    Dental  (+) Teeth Intact   Pulmonary neg pulmonary ROS,    breath sounds clear to auscultation       Cardiovascular hypertension, Pt. on medications (-) angina+ Peripheral Vascular Disease  (-) Past MI  Rhythm:Regular     Neuro/Psych  Headaches, TIA   GI/Hepatic Neg liver ROS, GERD  Medicated and Controlled,  Endo/Other  negative endocrine ROS  Renal/GU      Musculoskeletal  (+) Arthritis ,   Abdominal   Peds  Hematology   Anesthesia Other Findings   Reproductive/Obstetrics                             Anesthesia Physical Anesthesia Plan  ASA: III  Anesthesia Plan: General   Post-op Pain Management:    Induction: Intravenous  PONV Risk Score and Plan: 2 and Ondansetron and Treatment may vary due to age or medical condition  Airway Management Planned: Oral ETT  Additional Equipment: Arterial line  Intra-op Plan:   Post-operative Plan: Extubation in OR  Informed Consent: I have reviewed the patients History and Physical, chart, labs and discussed the procedure including the risks, benefits and alternatives for the proposed anesthesia with the patient or authorized representative who has indicated his/her understanding and acceptance.   Dental advisory given  Plan Discussed with: CRNA and Surgeon  Anesthesia Plan Comments:         Anesthesia Quick Evaluation

## 2017-03-06 NOTE — Discharge Instructions (Signed)
° °  [facility] [place] ° °Discharge Instructions °  °Carotid Endarterectomy (CEA) ° °Please refer to the following instructions for your post-procedure care. Your surgeon or physician assistant will discuss any changes with you. ° °Activity ° °You are encouraged to walk as much as you can. You can slowly return to normal activities but must avoid strenuous activity and heavy lifting until your doctor tell you it's OK. Avoid activities such as vacuuming or swinging a golf club. You can drive after one week if you are comfortable and you are no longer taking prescription pain medications. It is normal to feel tired for serval weeks after your surgery. It is also normal to have difficulty with sleep habits, eating, and bowel movements after surgery. These will go away with time. ° °Bathing/Showering ° °You may shower after you go home. Do not soak in a bathtub, hot tub, or swim until the incision heals completely. ° °Incision Care ° °Shower every day. Clean your incision with mild soap and water. Pat the area dry with a clean towel. You do not need a bandage unless otherwise instructed. Do not apply any ointments or creams to your incision. You may have skin glue on your incision. Do not peel it off. It will come off on its own in about one week. Your incision may feel thickened and raised for several weeks after your surgery. This is normal and the skin will soften over time. For Men Only: It's OK to shave around the incision but do not shave the incision itself for 2 weeks. It is common to have numbness under your chin that could last for several months. ° °Diet ° °Resume your normal diet. There are no special food restrictions following this procedure. A low fat/low cholesterol diet is recommended for all patients with vascular disease. In order to heal from your surgery, it is CRITICAL to get adequate nutrition. Your body requires vitamins, minerals, and protein. Vegetables are the best  source of vitamins and minerals. Vegetables also provide the perfect balance of protein. Processed food has little nutritional value, so try to avoid this. ° °Medications ° °Resume taking all of your medications unless your doctor or physician assistant tells you not to. If your incision is causing pain, you may take over-the- counter pain relievers such as acetaminophen (Tylenol). If you were prescribed a stronger pain medication, please be aware these medications can cause nausea and constipation. Prevent nausea by taking the medication with a snack or meal. Avoid constipation by drinking plenty of fluids and eating foods with a high amount of fiber, such as fruits, vegetables, and grains. Do not take Tylenol if you are taking prescription pain medications. ° °Follow Up ° °Our office will schedule a follow up appointment 2-3 weeks following discharge. ° °Please call us immediately for any of the following conditions ° °Increased pain, redness, drainage (pus) from your incision site. °Fever of 101 degrees or higher. °If you should develop stroke (slurred speech, difficulty swallowing, weakness on one side of your body, loss of vision) you should call 911 and go to the nearest emergency room. ° °Reduce your risk of vascular disease: ° °Stop smoking. If you would like help call [facility] at 1-800-QUIT-NOW (1-800-784-8669) or [place] at 336-586-4000. °Manage your cholesterol °Maintain a desired weight °Control your diabetes °Keep your blood pressure down ° °If you have any questions, please call the office at [phone]. ° °

## 2017-03-06 NOTE — Transfer of Care (Signed)
Immediate Anesthesia Transfer of Care Note  Patient: Patrick Vargas  Procedure(s) Performed: ENDARTERECTOMY CAROTID RIGHT (Right )  Patient Location: PACU  Anesthesia Type:General  Level of Consciousness: awake, alert  and oriented  Airway & Oxygen Therapy: Patient Spontanous Breathing and Patient connected to face mask oxygen  Post-op Assessment: Report given to RN and Post -op Vital signs reviewed and stable  Post vital signs: Reviewed and stable  Last Vitals: There were no vitals filed for this visit.  Last Pain: There were no vitals filed for this visit.    Patients Stated Pain Goal: 0 (24/40/10 2725)  Complications: No apparent anesthesia complications

## 2017-03-06 NOTE — Interval H&P Note (Signed)
History and Physical Interval Note:  03/06/2017 12:22 PM  Patrick Vargas  has presented today for surgery, with the diagnosis of RIGHT CAROTID STENOSIS  The various methods of treatment have been discussed with the patient and family. After consideration of risks, benefits and other options for treatment, the patient has consented to  Procedure(s): ENDARTERECTOMY CAROTID RIGHT (Right) as a surgical intervention .  The patient's history has been reviewed, patient examined, no change in status, stable for surgery.  I have reviewed the patient's chart and labs.  Questions were answered to the patient's satisfaction.     Ruta Hinds

## 2017-03-06 NOTE — Progress Notes (Signed)
  Day of Surgery Note    Subjective:  Awake in pacu; denies any visual changes; says his neck is sore   Incisions:   Clean and dry without hematoma Extremities:  Moving all extremities equally Cardiac:  regular Lungs:  Non labored Neuro:  In tact; tongue is midline   Assessment/Plan:  This is a 77 y.o. male who is s/p  Right carotid endarterectomy   -pt doing well in pacu and neuro in tact -to [place] later today -anticipate discharge tomorrow if night is uneventful   Leontine Locket, PA-C 03/06/2017 3:39 PM [phone]

## 2017-03-06 NOTE — Anesthesia Procedure Notes (Addendum)
Procedure Name: Intubation Date/Time: 03/06/2017 1:01 PM Performed by: Teressa Lower., CRNA Pre-anesthesia Checklist: Patient identified, Emergency Drugs available, Suction available and Patient being monitored Patient Re-evaluated:Patient Re-evaluated prior to induction Oxygen Delivery Method: Circle system utilized Preoxygenation: Pre-oxygenation with 100% oxygen Induction Type: IV induction Ventilation: Mask ventilation without difficulty Laryngoscope Size: Mac and 4 Grade View: Grade II Tube type: Oral Tube size: 7.5 mm Number of attempts: 1 Airway Equipment and Method: Stylet,  Oral airway and LTA kit utilized Placement Confirmation: ETT inserted through vocal cords under direct vision,  positive ETCO2 and breath sounds checked- equal and bilateral Secured at: 22 cm Tube secured with: Tape Dental Injury: Teeth and Oropharynx as per pre-operative assessment

## 2017-03-07 ENCOUNTER — Encounter (HOSPITAL_COMMUNITY): Payer: Self-pay | Admitting: Vascular Surgery

## 2017-03-07 ENCOUNTER — Telehealth: Payer: Self-pay | Admitting: Vascular Surgery

## 2017-03-07 LAB — CBC
HEMATOCRIT: 35.5 % — AB (ref 39.0–52.0)
HEMOGLOBIN: 12.2 g/dL — AB (ref 13.0–17.0)
MCH: 29.8 pg (ref 26.0–34.0)
MCHC: 34.4 g/dL (ref 30.0–36.0)
MCV: 86.8 fL (ref 78.0–100.0)
Platelets: 179 10*3/uL (ref 150–400)
RBC: 4.09 MIL/uL — AB (ref 4.22–5.81)
RDW: 13.3 % (ref 11.5–15.5)
WBC: 12.2 10*3/uL — ABNORMAL HIGH (ref 4.0–10.5)

## 2017-03-07 LAB — BASIC METABOLIC PANEL
ANION GAP: 10 (ref 5–15)
BUN: 27 mg/dL — ABNORMAL HIGH (ref 6–20)
CO2: 20 mmol/L — AB (ref 22–32)
Calcium: 8.8 mg/dL — ABNORMAL LOW (ref 8.9–10.3)
Chloride: 101 mmol/L (ref 101–111)
Creatinine, Ser: 1.33 mg/dL — ABNORMAL HIGH (ref 0.61–1.24)
GFR calc non Af Amer: 51 mL/min — ABNORMAL LOW (ref >60)
GFR, EST AFRICAN AMERICAN: 59 mL/min — AB (ref >60)
GLUCOSE: 150 mg/dL — AB (ref 65–99)
POTASSIUM: 4.1 mmol/L (ref 3.5–5.1)
Sodium: 131 mmol/L — ABNORMAL LOW (ref 135–145)

## 2017-03-07 MED ORDER — HYDROCODONE-ACETAMINOPHEN 5-325 MG PO TABS: 1.0000 | ORAL_TABLET | ORAL | 0 refills | Status: DC | PRN

## 2017-03-07 NOTE — Discharge Summary (Signed)
Discharge Summary     Patrick Vargas [birth year] 77 y.o. male  [id number]  Admission Date: 03/06/2017  Discharge Date: 03/07/17  Physician: Elam Dutch, MD  Admission Diagnosis: RIGHT CAROTID STENOSIS   HPI:   This is a 77 y.o. male referred by Dr. 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  He describes these as a kaleidoscope seen in one eye or the other intermittently.  These usually resolve with Tylenol.  However, in mid December he had a new symptom where he lost vision in the lower field of his right eye and then this resolved after about 15 minutes.  He described this as a grayish color with a screen that covered the eye and then resolved.  He currently is on aspirin 81 mg once daily.  He also takes a statin intermittently but has some problems with fatigue related to this medication.  He denies any prior coronary events.  He had a negative stress test about 3 years ago.  He denies shortness of breath or chest pain.  He has had no prior stroke.  However, he did have an episode 26 years ago where he has 6 months of amnesia but it was never determined what caused this.  He has also had shingles in the right brain and ear in the past which caused some balance issues but this has essentially resolved.  He does have a family history of strokes in his father at age 70.  His mother also had a carotid endarterectomy in her 39s.  Other medical problems include hyperlipidemia and hypertension both of which are currently controlled.  Hospital Course:  The patient was admitted to the hospital and taken to the operating room on 03/06/2017 and underwent righ carotid endarterectomy.  The pt tolerated the procedure well and was transported to the PACU in good condition.   By POD 1, the pt neuro status was intact.  He is moving all extremities equally, no  swallowing difficulties.  He is a little hoarse this am.  He is voiding without difficulty.   The remainder of the hospital course consisted of increasing mobilization and increasing intake of solids without difficulty.   Recent Labs    03/06/17 1007 03/07/17 0252  NA 135 131*  K 4.1 4.1  CL 100* 101  CO2 22 20*  GLUCOSE 95 150*  BUN 23* 27*  CALCIUM 9.5 8.8*   Recent Labs    03/06/17 1007 03/07/17 0252  WBC 9.8 12.2*  HGB 16.0 12.2*  HCT 46.2 35.5*  PLT 202 179   Recent Labs    03/06/17 1007  INR 0.98       Discharge Diagnosis:  RIGHT CAROTID STENOSIS  Secondary Diagnosis: Patient Active Problem List   Diagnosis Date Noted  . Carotid artery stenosis, symptomatic, right 03/06/2017  . Preoperative cardiovascular examination 11/19/2015  . Hypertension 09/03/2012  . Hyperlipidemia 09/03/2012  . Overweight (BMI 25.0-29.9) 09/03/2012  . Statin intolerance 09/03/2012  . 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 Arthritis   . Dyslipidemia   . GERD (gastroesophageal reflux disease)   . Headache    ocular migraines  . History of kidney stones   . Peripheral vascular disease ([place])    Carotid stenosis right   . PVC's (premature ventricular contractions)   . Systemic hypertension   . 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   Oxycodone Nausea Only   Statins Other (See Comments)   Depression        Medication List    TAKE these medications   acetaminophen 500 MG tablet Commonly known as:  TYLENOL Take 1,000 mg by mouth every 6 (six) hours as needed for moderate pain or headache.   amLODipine 10 MG tablet Commonly known as:  NORVASC TAKE 1 TABLET BY MOUTH EVERY DAY What changed:    how much to take  how to take this  when to take this   aspirin 81 MG tablet Take 81 mg by mouth daily.   CELEBREX 200 MG capsule Generic drug:  celecoxib Take 200 mg by mouth daily.   cetirizine 10 MG tablet Commonly known as:  ZYRTEC Take 10 mg by mouth daily.   Coenzyme Q10 200 MG capsule Take 200 mg by mouth daily.   hydrochlorothiazide 25 MG tablet Commonly known as:  HYDRODIURIL TAKE 1 TABLET BY MOUTH DAILY What changed:    how much to take  how to take this  when to take this   HYDROcodone-acetaminophen 5-325 MG tablet Commonly known as:  NORCO/VICODIN Take 1 tablet by mouth every 4 (four) hours as needed for moderate pain.   lisinopril 20 MG tablet Commonly known as:  PRINIVIL,ZESTRIL TAKE 1 TABLET BY MOUTH EVERY DAY What changed:    how much to take  how to take this  when to take this   pantoprazole 40 MG tablet Commonly known as:  PROTONIX Take 40 mg by mouth daily.   pravastatin 40 MG tablet Commonly known as:  PRAVACHOL Take 1 tablet (40 mg total) by mouth every evening. What changed:  when to take this        Discharge Instructions: .sjrdc  [facility] Discharge Instructions Carotid Endarterectomy (CEA)  Please refer to the following instructions for your post-procedure care. Your surgeon or physician assistant will discuss any changes with you.  Activity  You are encouraged to walk as much as you can. You can slowly return to normal activities but must avoid strenuous activity and heavy lifting until your doctor tell you it's OK. Avoid activities such as vacuuming or swinging a golf club. You can drive  after one week if you are comfortable and you are no longer taking prescription pain medications. It is normal to feel tired for serval weeks after your surgery. It is also normal to have difficulty with sleep habits, eating, and bowel movements after surgery. These will go away with time.  Bathing/Showering  You may shower after you come home. Do not soak in a bathtub, hot tub, or swim until the incision heals completely.  Incision Care  Shower every day. Clean your incision with mild soap and water. Pat the area dry with a clean towel. You do not need a bandage unless otherwise instructed. Do not apply any ointments or creams to your incision.  You may have skin glue on your incision. Do not peel it off. It will come off on its own in about one week. Your incision may feel thickened and raised for several weeks after your surgery. This is normal and the skin will soften over time. For Men Only: It's OK to shave around the incision but do not shave the incision itself for 2 weeks. It is common to have numbness under your chin that could last for several months.  Diet  Resume your normal diet. There are no special food restrictions following this procedure. A low fat/low cholesterol diet is recommended for all patients with vascular disease. In order to heal from your surgery, it is CRITICAL to get adequate nutrition. Your body requires vitamins, minerals, and protein. Vegetables are the best source of vitamins and minerals. Vegetables also provide the perfect balance of protein. Processed food has little nutritional value, so try to avoid this.  Medications  Resume taking all of your medications unless your doctor or physician assistant tells you not to.  If your incision is causing pain, you may take over-the- counter pain relievers such as acetaminophen (Tylenol). If you were prescribed a stronger pain medication, please be aware these medications can cause nausea and constipation.  Prevent nausea  by taking the medication with a snack or meal. Avoid constipation by drinking plenty of fluids and eating foods with a high amount of fiber, such as fruits, vegetables, and grains. Do not take Tylenol if you are taking prescription pain medications.  Follow Up  Our office will schedule a follow up appointment 2-3 weeks following discharge.  Please call us immediately for any of the following conditions  Increased pain, redness, drainage (pus) from your incision site. Fever of 101 degrees or higher. If you should develop stroke (slurred speech, difficulty swallowing, weakness on one side of your body, loss of vision) you should call 911 and go to the nearest emergency room.  Reduce your risk of vascular disease:  Stop smoking. If you would like help call [facility] at 1-800-QUIT-NOW (336) 692-1172) or [place] at ([phone]. Manage your cholesterol Maintain a desired weight Control your diabetes Keep your blood pressure down  If you have any questions, please call the office at [phone].  Prescriptions given: Vicodin #6 No Refill  Disposition: home  Patient's condition: is Good  Follow up: 1. Dr. Oneida Alar in 2 weeks with carotid duplex   Leontine Locket, PA-C [facility] [phone]   --- For [facility] Registry use ---   Modified Rankin score at D/C (0-6): 0  IV medication needed for:  1. Hypertension: No 2. Hypotension: No  Post-op Complications: No  1. Post-op CVA or TIA: No  If yes: Event classification (right eye, left eye, right cortical, left cortical, verterobasilar, other): n/a  If yes: Timing of event (intra-op, <6 hrs post-op, >=6 hrs post-op, unknown):  n/a  2. CN injury: No  If yes: CN n/a injuried   3. Myocardial infarction: No  If yes: Dx by (EKG or clinical, Troponin): n/a  4.  CHF: No  5.  Dysrhythmia (new): No  6. Wound infection: No  7. Reperfusion symptoms: No  8. Return to OR: No  If yes: return to OR for  (bleeding, neurologic, other CEA incision, other): n/a  Discharge medications: Statin use:  Yes. ASA use:  Yes   Beta blocker use:  No ACE-Inhibitor use:  Yes  ARB use:  No CCB use: Yes P2Y12 Antagonist use: No, [ ]   Plavix, [ ]  Plasugrel, [ ]  Ticlopinine, [ ]  Ticagrelor, [ ]  Other, [ ]  No for medical reason, [ ]  Non-compliant, [ ]  Not-indicated Anti-coagulant use:  No, [ ]  Warfarin, [ ]  Rivaroxaban, [ ]  Dabigatran,

## 2017-03-07 NOTE — Telephone Encounter (Signed)
Sched lab 03/30/17 at 4:00 and MD 04/06/17 at 12:30. Lm on hm# to inform pt of appts.

## 2017-03-07 NOTE — Progress Notes (Addendum)
  Progress Note    03/07/2017 7:22 AM 1 Day Post-Op  Subjective:  Has a little soreness in his neck; has not taken any pain medicine; says he's a little hoarse.  No swallowing issues.  Afebrile HR  50's-80's NSR 944'H-675'F systolic 163% RA  Vitals:   03/06/17 2300 03/07/17 0421  BP: (!) 115/58 128/60  Pulse: (!) 54 61  Resp: 13 15  Temp: 98 F (36.7 C) 98.2 F (36.8 C)  SpO2: 97% 98%     Physical Exam: Neuro:  In tact; tongue is midline; moving all extremities equally Lungs:  Non labored Incision:  Clean and dry without hematoma  CBC    Component Value Date/Time   WBC 12.2 (H) 03/07/2017 0252   RBC 4.09 (L) 03/07/2017 0252   HGB 12.2 (L) 03/07/2017 0252   HCT 35.5 (L) 03/07/2017 0252   PLT 179 03/07/2017 0252   MCV 86.8 03/07/2017 0252   MCH 29.8 03/07/2017 0252   MCHC 34.4 03/07/2017 0252   RDW 13.3 03/07/2017 0252   LYMPHSABS 2.4 11/12/2010 1137   MONOABS 0.5 11/12/2010 1137   EOSABS 0.2 11/12/2010 1137   BASOSABS 0.0 11/12/2010 1137    BMET    Component Value Date/Time   NA 131 (L) 03/07/2017 0252   K 4.1 03/07/2017 0252   CL 101 03/07/2017 0252   CO2 20 (L) 03/07/2017 0252   GLUCOSE 150 (H) 03/07/2017 0252   BUN 27 (H) 03/07/2017 0252   CREATININE 1.33 (H) 03/07/2017 0252   CREATININE 1.01 09/16/2013 0832   CALCIUM 8.8 (L) 03/07/2017 0252   GFRNONAA 51 (L) 03/07/2017 0252   GFRAA 59 (L) 03/07/2017 0252     Intake/Output Summary (Last 24 hours) at 03/07/2017 0722 Last data filed at 03/07/2017 0422 Gross per 24 hour  Intake 1875 ml  Output 1090 ml  Net 785 ml     Assessment/Plan:  This is a 77 y.o. male who is s/p right CEA 1 Day Post-Op  -pt is doing well this am. -pt neuro exam is in tact; swallowing without difficulty -pt has not ambulated-needs to walk -pain controlled-has not taken any pain medication. -pt has voided -discharge home after breakfast -f/u with Dr. Oneida Alar in 2 weeks with a carotid duplex.  Will need a left carotid  endarterectomy in the near future when he recovers.    Leontine Locket, PA-C [facility] [phone]   Agree with above.  No neck hematoma Neuro intact D/c home  Ruta Hinds, MD [facility] [place] Office: [phone] Pager: [phone]

## 2017-03-07 NOTE — Telephone Encounter (Signed)
-----   Message from Mena Goes, RN sent at 03/06/2017  4:10 PM EST ----- Regarding: 2-3 weeks post CEA   ----- Message ----- From: Gabriel Earing, PA-C Sent: 03/06/2017   3:12 PM To: [facility] Charge Pool  S/p right CEA.  Follow up with Dr. Oneida Alar in 2-3 weeks.  He will need a bilateral carotid duplex when he returns for his follow up.  Thanks

## 2017-03-07 NOTE — Anesthesia Postprocedure Evaluation (Signed)
Anesthesia Post Note  Patient: Patrick Vargas  Procedure(s) Performed: ENDARTERECTOMY CAROTID RIGHT (Right )     Patient location during evaluation: PACU Anesthesia Type: General Level of consciousness: awake and alert Pain management: pain level controlled Vital Signs Assessment: post-procedure vital signs reviewed and stable Respiratory status: spontaneous breathing, nonlabored ventilation, respiratory function stable and patient connected to nasal cannula oxygen Cardiovascular status: blood pressure returned to baseline and stable Postop Assessment: no apparent nausea or vomiting Anesthetic complications: no    Last Vitals:  Vitals:   03/07/17 0803 03/07/17 0924  BP: (!) 100/58 119/63  Pulse: 63   Resp: 14   Temp: 37.1 C   SpO2: 97%     Last Pain:  Vitals:   03/07/17 0803  TempSrc: Oral  PainSc: 0-No pain                 Benigna Delisi

## 2017-03-08 NOTE — Consult Note (Signed)
            [facility] CM Primary Care Navigator  03/08/2017  Austyn Seier Zollars 11-28-1940 [id number]   Went to seepatient at the bedsideto identify possible discharge needs but he was alreadydischargedper staff report.  Patient was admitted forright carotid stenosis and underwent right carotid endarterectomy.  Patient was discharged home yesterday.  Primary care provider's officeis listed asprovidingtransition of care (TOC).  Patient has discharge instruction to follow-up with vascular surgery in 2 weeks.   For additional questions please contact:  Edwena Felty A. Doye Montilla, BSN, RN-BC [facility] PRIMARY CARE Navigator Cell: [phone]

## 2017-03-09 DIAGNOSIS — K219 Gastro-esophageal reflux disease without esophagitis: Secondary | ICD-10-CM | POA: Diagnosis not present

## 2017-03-09 DIAGNOSIS — R197 Diarrhea, unspecified: Secondary | ICD-10-CM | POA: Diagnosis not present

## 2017-03-10 DIAGNOSIS — R197 Diarrhea, unspecified: Secondary | ICD-10-CM | POA: Diagnosis not present

## 2017-03-13 ENCOUNTER — Other Ambulatory Visit: Payer: Self-pay

## 2017-03-13 DIAGNOSIS — I6521 Occlusion and stenosis of right carotid artery: Secondary | ICD-10-CM

## 2017-03-23 ENCOUNTER — Encounter: Payer: Self-pay | Admitting: Cardiovascular Disease

## 2017-03-23 ENCOUNTER — Ambulatory Visit (INDEPENDENT_AMBULATORY_CARE_PROVIDER_SITE_OTHER): Payer: Medicare Other | Admitting: Cardiovascular Disease

## 2017-03-23 VITALS — BP 128/87 | HR 97 | Ht 67.0 in | Wt 189.0 lb

## 2017-03-23 DIAGNOSIS — I493 Ventricular premature depolarization: Secondary | ICD-10-CM | POA: Diagnosis not present

## 2017-03-23 DIAGNOSIS — I10 Essential (primary) hypertension: Secondary | ICD-10-CM | POA: Diagnosis not present

## 2017-03-23 DIAGNOSIS — I6523 Occlusion and stenosis of bilateral carotid arteries: Secondary | ICD-10-CM

## 2017-03-23 DIAGNOSIS — E785 Hyperlipidemia, unspecified: Secondary | ICD-10-CM | POA: Diagnosis not present

## 2017-03-23 DIAGNOSIS — E663 Overweight: Secondary | ICD-10-CM | POA: Diagnosis not present

## 2017-03-23 NOTE — Patient Instructions (Addendum)
Medication Instructions: Dr Sallyanne Kuster recommended starting you on a medication called Repatha. You will hear from our clinical pharmacy staff regarding the next steps.  Labwork: Your physician recommends that you return for lab work at your earliest [place].  Testing/Procedures: NONE ORDERED  Follow-up: Dr Sallyanne Kuster recommends that you schedule a follow-up appointment in 12 months. You will receive a reminder letter in the mail two months in advance. If you don't receive a letter, please call our office to schedule the follow-up appointment.  If you need a refill on your cardiac medications before your next appointment, please call your pharmacy.

## 2017-03-23 NOTE — Progress Notes (Signed)
Cardiology Office Note    Date:  03/23/2017   ID:  Patrick Vargas, DOB 10/11/1940, MRN [id number]  PCP:  Maurice Small, MD  Cardiologist:   Sanda Klein, MD   Chief Complaint  Patient presents with  . Hospitalization Follow-up    CAD    History of Present Illness:  Patrick Vargas is a 77 y.o. male with hypertension and hyperlipidemia returning for follow-up after undergoing carotid endarterectomy on January 21.  He had a very brief episode of amaurosis fugax and was referred by his ophthalmologist for carotid ultrasonography which showed bilateral >80% stenosis.  He underwent right carotid endarterectomy with Dr. Oneida Alar on January 21 and is recovering very well from surgery.  He has not had any further neurological complaints since that time.  The patient specifically denies any chest pain at rest exertion, dyspnea at rest or with exertion, orthopnea, paroxysmal nocturnal dyspnea, syncope, palpitations, focal neurological deficits, intermittent claudication, lower extremity edema, unexplained weight gain, cough, hemoptysis or wheezing.  He had a normal echo in 2013 and normal nuclear stress tests in 2004 and 2007, but has never had coronary angiography.   He has long-standing severe hypercholesterolemia.  Despite compliance with pravastatin, his LDL cholesterol last September was still very elevated at 151.  He did not tolerate more potent statins (he has tried simvastatin, rosuvastatin and atorvastatin).    Past Medical History:  Diagnosis Date  . Arthritis   . Dyslipidemia   . GERD (gastroesophageal reflux disease)   . Headache    ocular migraines  . History of kidney stones   . Peripheral vascular disease ([place])    Carotid stenosis right   . PVC's (premature ventricular contractions)   . Systemic hypertension   . 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 BUNIONECTOMY  1994   Left foot  . CARPAL TUNNEL RELEASE  1999     Left hand  . ENDARTERECTOMY Right 03/06/2017   Procedure: ENDARTERECTOMY CAROTID RIGHT;  Surgeon: Elam Dutch, MD;  Location: [facility] OR;  Service: Vascular;  Laterality: Right;  . TENDON REPAIR Right    right foot  . TOTAL KNEE ARTHROPLASTY  12/04/2010   Right    Current Medications: Outpatient Medications Prior to Visit  Medication Sig Dispense Refill  . acetaminophen (TYLENOL) 500 MG tablet Take 1,000 mg by mouth every 6 (six) hours as needed for moderate pain or headache.    Marland Kitchen amLODipine (NORVASC) 10 MG tablet TAKE 1 TABLET BY MOUTH EVERY DAY (Patient taking differently: TAKE 10 MG BY MOUTH EVERY DAY) 90 tablet 3  . aspirin 81 MG tablet Take 81 mg by mouth daily.    . CELEBREX 200 MG capsule Take 200 mg by mouth daily.     . cetirizine (ZYRTEC) 10 MG tablet Take 10 mg by mouth daily.    . Coenzyme Q10 200 MG capsule Take 200 mg by mouth daily.    . hydrochlorothiazide (HYDRODIURIL) 25 MG tablet TAKE 1 TABLET BY MOUTH DAILY (Patient taking differently: TAKE 25 MG BY MOUTH DAILY) 90 tablet 0  . HYDROcodone-acetaminophen (NORCO/VICODIN) 5-325 MG tablet Take 1 tablet by mouth every 4 (four) hours as needed for moderate pain. 6 tablet 0  . lisinopril (PRINIVIL,ZESTRIL) 20 MG tablet TAKE 1 TABLET BY MOUTH EVERY DAY (Patient taking differently: TAKE 20 MG BY MOUTH EVERY DAY) 90 tablet 3  . pravastatin (PRAVACHOL) 40 MG tablet Take 1 tablet (  40 mg total) by mouth every evening. (Patient taking differently: Take 40 mg by mouth every other day. ) 90 tablet 3  . pantoprazole (PROTONIX) 40 MG tablet Take 40 mg by mouth daily.     No facility-administered medications prior to visit.      Allergies:   Amoxicillin; Oxycodone; and Statins   Social History   Socioeconomic History  . Marital status: Married    Spouse name: None  . Number of children: None  . Years of education: None  . Highest education level: None  Social Needs  . Financial resource strain: None  . Food insecurity -  worry: None  . Food insecurity - inability: None  . Transportation needs - medical: None  . Transportation needs - non-medical: None  Occupational History  . None  Tobacco Use  . Smoking status: Never Smoker  . Smokeless tobacco: Never Used  Substance and Sexual Activity  . Alcohol use: Yes    Alcohol/week: 2.5 oz    Types: 5 Standard drinks or equivalent per week  . Drug use: No  . Sexual activity: None  Other Topics Concern  . None  Social History Narrative  . None      ROS:   Please see the history of present illness.    ROS All other systems reviewed and are negative.   PHYSICAL EXAM:   VS:  BP 128/87   Pulse 97   Ht 5\' 7"  (1.702 m)   Wt 189 lb (85.7 kg)   SpO2 98%   BMI 29.60 kg/m     General: Alert, oriented x3, no distress Head: no evidence of trauma, PERRL, EOMI, no exophtalmos or lid lag, no myxedema, no xanthelasma; normal ears, nose and oropharynx Neck: normal jugular venous pulsations and no hepatojugular reflux; brisk carotid pulses without delay and no carotid bruits.  Well-healing scar of right carotid endarterectomy with a small hematoma at the cranial end Chest: clear to auscultation, no signs of consolidation by percussion or palpation, normal fremitus, symmetrical and full respiratory excursions Cardiovascular: normal position and quality of the apical impulse, regular rhythm, normal first and second heart sounds, no murmurs, rubs or gallops Abdomen: no tenderness or distention, no masses by palpation, no abnormal pulsatility or arterial bruits, normal bowel sounds, no hepatosplenomegaly Extremities: no clubbing, cyanosis or edema; 2+ radial, ulnar and brachial pulses bilaterally; 2+ right femoral, posterior tibial and dorsalis pedis pulses; 2+ left femoral, posterior tibial and dorsalis pedis pulses; no subclavian or femoral bruits Neurological: grossly nonfocal Psych: Normal mood and affect   Wt Readings from Last 3 Encounters:  03/23/17 189 lb  (85.7 kg)  03/07/17 195 lb 12.3 oz (88.8 kg)  03/02/17 190 lb (86.2 kg)      Studies/Labs Reviewed:   EKG:  EKG is not ordered today.    Recent Labs: September 2018  Creatinine 1.33, hemoglobin 12.2, potassium 4.1  total cholesterol 223,Triglycerides 172, HDL 38, LDL 151  Normal liver function tests     Component Value Date/Time   CHOL 222 (H) 09/16/2013 0832   TRIG 221 (H) 09/16/2013 0832   HDL 39 (L) 09/16/2013 0832   CHOLHDL 5.7 09/16/2013 0832   VLDL 44 (H) 09/16/2013 0832   LDLCALC 139 (H) 09/16/2013 6195    Additional studies/ records that were reviewed today include:  Dr. Oneida Alar, recent [facility] admission  ASSESSMENT:    1. Dyslipidemia   2. Bilateral carotid artery stenosis   3. Essential hypertension   4. Overweight   5.  PVC's (premature ventricular contractions)      PLAN:  In order of problems listed above:  1. HLP: Clearly pravastatin is insufficient to reduce his LDL cholesterol to a target under 70 mg/deciliter.  He did not tolerate more potent statins due to generalized muscle weakness.  Plan to add a PCSK9 inhibitor and started the process of approval for Repatha. 2. S/P R CEA, L carotid stenosis: the right carotid was clearly the culprit for his episode of right amaurosis fugax, but he still has a high-grade stenosis in the left internal carotid He may require contralateral endarterectomy as well. 3. HTN: Well controlled on current medications.  He has a follow-up with Dr. Oneida Alar in a couple of weeks 4. Overweight: Weight loss recommended 5. PVCs: none detected on today's exam, frequently seen on previous ECG tracings,     Medication Adjustments/Labs and Tests Ordered: Current medicines are reviewed at length with the patient today.  Concerns regarding medicines are outlined above.  Medication changes, Labs and Tests ordered today are listed in the Patient Instructions below. Patient Instructions  Medication Instructions: Dr Sallyanne Kuster recommended  starting you on a medication called Repatha. You will hear from our clinical pharmacy staff regarding the next steps.  Labwork: Your physician recommends that you return for lab work at your earliest [place].  Testing/Procedures: NONE ORDERED  Follow-up: Dr Sallyanne Kuster recommends that you schedule a follow-up appointment in 12 months. You will receive a reminder letter in the mail two months in advance. If you don't receive a letter, please call our office to schedule the follow-up appointment.  If you need a refill on your cardiac medications before your next appointment, please call your pharmacy.    Signed, Sanda Klein, MD  03/23/2017 6:39 PM    [facility] [place], [place], [place]  [address] Phone: [phone]; Fax: [phone]

## 2017-03-23 NOTE — H&P (View-Only) (Signed)
Cardiology Office Note    Date:  03/23/2017   ID:  Patrick Vargas, DOB [birth year], MRN [id number]  PCP:  Maurice Small, MD  Cardiologist:   Sanda Klein, MD   Chief Complaint  Patient presents with  . Hospitalization Follow-up    CAD    History of Present Illness:  Patrick Vargas is a 77 y.o. male with hypertension and hyperlipidemia returning for follow-up after undergoing carotid endarterectomy on January 21.  He had a very brief episode of amaurosis fugax and was referred by his ophthalmologist for carotid ultrasonography which showed bilateral >80% stenosis.  He underwent right carotid endarterectomy with Dr. Oneida Alar on January 21 and is recovering very well from surgery.  He has not had any further neurological complaints since that time.  The patient specifically denies any chest pain at rest exertion, dyspnea at rest or with exertion, orthopnea, paroxysmal nocturnal dyspnea, syncope, palpitations, focal neurological deficits, intermittent claudication, lower extremity edema, unexplained weight gain, cough, hemoptysis or wheezing.  He had a normal echo in 2013 and normal nuclear stress tests in 2004 and 2007, but has never had coronary angiography.   He has long-standing severe hypercholesterolemia.  Despite compliance with pravastatin, his LDL cholesterol last September was still very elevated at 151.  He did not tolerate more potent statins (he has tried simvastatin, rosuvastatin and atorvastatin).    Past Medical History:  Diagnosis Date  . Arthritis   . Dyslipidemia   . GERD (gastroesophageal reflux disease)   . Headache    ocular migraines  . History of kidney stones   . Peripheral vascular disease ([place])    Carotid stenosis right   . PVC's (premature ventricular contractions)   . Systemic hypertension   . 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 BUNIONECTOMY  1994   Left foot  . CARPAL TUNNEL RELEASE  1999     Left hand  . ENDARTERECTOMY Right 03/06/2017   Procedure: ENDARTERECTOMY CAROTID RIGHT;  Surgeon: Elam Dutch, MD;  Location: [facility] OR;  Service: Vascular;  Laterality: Right;  . TENDON REPAIR Right    right foot  . TOTAL KNEE ARTHROPLASTY  12/04/2010   Right    Current Medications: Outpatient Medications Prior to Visit  Medication Sig Dispense Refill  . acetaminophen (TYLENOL) 500 MG tablet Take 1,000 mg by mouth every 6 (six) hours as needed for moderate pain or headache.    Marland Kitchen amLODipine (NORVASC) 10 MG tablet TAKE 1 TABLET BY MOUTH EVERY DAY (Patient taking differently: TAKE 10 MG BY MOUTH EVERY DAY) 90 tablet 3  . aspirin 81 MG tablet Take 81 mg by mouth daily.    . CELEBREX 200 MG capsule Take 200 mg by mouth daily.     . cetirizine (ZYRTEC) 10 MG tablet Take 10 mg by mouth daily.    . Coenzyme Q10 200 MG capsule Take 200 mg by mouth daily.    . hydrochlorothiazide (HYDRODIURIL) 25 MG tablet TAKE 1 TABLET BY MOUTH DAILY (Patient taking differently: TAKE 25 MG BY MOUTH DAILY) 90 tablet 0  . HYDROcodone-acetaminophen (NORCO/VICODIN) 5-325 MG tablet Take 1 tablet by mouth every 4 (four) hours as needed for moderate pain. 6 tablet 0  . lisinopril (PRINIVIL,ZESTRIL) 20 MG tablet TAKE 1 TABLET BY MOUTH EVERY DAY (Patient taking differently: TAKE 20 MG BY MOUTH EVERY DAY) 90 tablet 3  . pravastatin (PRAVACHOL) 40 MG tablet Take 1 tablet (  40 mg total) by mouth every evening. (Patient taking differently: Take 40 mg by mouth every other day. ) 90 tablet 3  . pantoprazole (PROTONIX) 40 MG tablet Take 40 mg by mouth daily.     No facility-administered medications prior to visit.      Allergies:   Amoxicillin; Oxycodone; and Statins   Social History   Socioeconomic History  . Marital status: Married    Spouse name: None  . Number of children: None  . Years of education: None  . Highest education level: None  Social Needs  . Financial resource strain: None  . Food insecurity -  worry: None  . Food insecurity - inability: None  . Transportation needs - medical: None  . Transportation needs - non-medical: None  Occupational History  . None  Tobacco Use  . Smoking status: Never Smoker  . Smokeless tobacco: Never Used  Substance and Sexual Activity  . Alcohol use: Yes    Alcohol/week: 2.5 oz    Types: 5 Standard drinks or equivalent per week  . Drug use: No  . Sexual activity: None  Other Topics Concern  . None  Social History Narrative  . None      ROS:   Please see the history of present illness.    ROS All other systems reviewed and are negative.   PHYSICAL EXAM:   VS:  BP 128/87   Pulse 97   Ht 5\' 7"  (1.702 m)   Wt 189 lb (85.7 kg)   SpO2 98%   BMI 29.60 kg/m     General: Alert, oriented x3, no distress Head: no evidence of trauma, PERRL, EOMI, no exophtalmos or lid lag, no myxedema, no xanthelasma; normal ears, nose and oropharynx Neck: normal jugular venous pulsations and no hepatojugular reflux; brisk carotid pulses without delay and no carotid bruits.  Well-healing scar of right carotid endarterectomy with a small hematoma at the cranial end Chest: clear to auscultation, no signs of consolidation by percussion or palpation, normal fremitus, symmetrical and full respiratory excursions Cardiovascular: normal position and quality of the apical impulse, regular rhythm, normal first and second heart sounds, no murmurs, rubs or gallops Abdomen: no tenderness or distention, no masses by palpation, no abnormal pulsatility or arterial bruits, normal bowel sounds, no hepatosplenomegaly Extremities: no clubbing, cyanosis or edema; 2+ radial, ulnar and brachial pulses bilaterally; 2+ right femoral, posterior tibial and dorsalis pedis pulses; 2+ left femoral, posterior tibial and dorsalis pedis pulses; no subclavian or femoral bruits Neurological: grossly nonfocal Psych: Normal mood and affect   Wt Readings from Last 3 Encounters:  03/23/17 189 lb  (85.7 kg)  03/07/17 195 lb 12.3 oz (88.8 kg)  03/02/17 190 lb (86.2 kg)      Studies/Labs Reviewed:   EKG:  EKG is not ordered today.    Recent Labs: September 2018  Creatinine 1.33, hemoglobin 12.2, potassium 4.1  total cholesterol 223,Triglycerides 172, HDL 38, LDL 151  Normal liver function tests     Component Value Date/Time   CHOL 222 (H) 09/16/2013 0832   TRIG 221 (H) 09/16/2013 0832   HDL 39 (L) 09/16/2013 0832   CHOLHDL 5.7 09/16/2013 0832   VLDL 44 (H) 09/16/2013 0832   LDLCALC 139 (H) 09/16/2013 3664    Additional studies/ records that were reviewed today include:  Dr. Oneida Alar, recent [facility] admission  ASSESSMENT:    1. Dyslipidemia   2. Bilateral carotid artery stenosis   3. Essential hypertension   4. Overweight   5.  PVC's (premature ventricular contractions)      PLAN:  In order of problems listed above:  1. HLP: Clearly pravastatin is insufficient to reduce his LDL cholesterol to a target under 70 mg/deciliter.  He did not tolerate more potent statins due to generalized muscle weakness.  Plan to add a PCSK9 inhibitor and started the process of approval for Repatha. 2. S/P R CEA, L carotid stenosis: the right carotid was clearly the culprit for his episode of right amaurosis fugax, but he still has a high-grade stenosis in the left internal carotid He may require contralateral endarterectomy as well. 3. HTN: Well controlled on current medications.  He has a follow-up with Dr. Oneida Alar in a couple of weeks 4. Overweight: Weight loss recommended 5. PVCs: none detected on today's exam, frequently seen on previous ECG tracings,     Medication Adjustments/Labs and Tests Ordered: Current medicines are reviewed at length with the patient today.  Concerns regarding medicines are outlined above.  Medication changes, Labs and Tests ordered today are listed in the Patient Instructions below. Patient Instructions  Medication Instructions: Dr Sallyanne Kuster recommended  starting you on a medication called Repatha. You will hear from our clinical pharmacy staff regarding the next steps.  Labwork: Your physician recommends that you return for lab work at your earliest [place].  Testing/Procedures: NONE ORDERED  Follow-up: Dr Sallyanne Kuster recommends that you schedule a follow-up appointment in 12 months. You will receive a reminder letter in the mail two months in advance. If you don't receive a letter, please call our office to schedule the follow-up appointment.  If you need a refill on your cardiac medications before your next appointment, please call your pharmacy.    Signed, Sanda Klein, MD  03/23/2017 6:39 PM    [facility] [place], [place], [place]  [address] Phone: [phone]; Fax: [phone]

## 2017-03-24 ENCOUNTER — Encounter (HOSPITAL_COMMUNITY): Payer: BLUE CROSS/BLUE SHIELD

## 2017-03-27 ENCOUNTER — Encounter: Payer: BLUE CROSS/BLUE SHIELD | Admitting: Surgery

## 2017-03-27 ENCOUNTER — Other Ambulatory Visit: Payer: Self-pay | Admitting: Cardiovascular Disease

## 2017-03-27 DIAGNOSIS — E785 Hyperlipidemia, unspecified: Secondary | ICD-10-CM | POA: Diagnosis not present

## 2017-03-27 LAB — LIPID PANEL
CHOLESTEROL TOTAL: 191 mg/dL (ref 100–199)
Chol/HDL Ratio: 5.2 ratio — ABNORMAL HIGH (ref 0.0–5.0)
HDL: 37 mg/dL — ABNORMAL LOW (ref >39)
LDL CALC: 120 mg/dL — AB (ref 0–99)
TRIGLYCERIDES: 171 mg/dL — AB (ref 0–149)
VLDL Cholesterol Cal: 34 mg/dL (ref 5–40)

## 2017-03-29 ENCOUNTER — Other Ambulatory Visit: Payer: Self-pay | Admitting: Pharmacist Clinician (PhC)/ Clinical Pharmacy Specialist

## 2017-03-29 MED ORDER — EVOLOCUMAB WITH INFUSOR 420 MG/3.5ML ~~LOC~~ SOCT: 420.0000 mg | SUBCUTANEOUS | 12 refills | Status: DC

## 2017-03-30 ENCOUNTER — Ambulatory Visit (HOSPITAL_COMMUNITY)
Admit: 2017-03-30 | Discharge: 2017-03-30 | Disposition: A | Discharge status: Home / Self Care | Payer: Medicare Other | Attending: Vascular Surgery | Admitting: Vascular Surgery

## 2017-03-30 DIAGNOSIS — I6521 Occlusion and stenosis of right carotid artery: Secondary | ICD-10-CM | POA: Diagnosis not present

## 2017-03-30 DIAGNOSIS — Z9889 Other specified postprocedural states: Secondary | ICD-10-CM | POA: Insufficient documentation

## 2017-03-30 DIAGNOSIS — I6522 Occlusion and stenosis of left carotid artery: Secondary | ICD-10-CM | POA: Insufficient documentation

## 2017-03-30 DIAGNOSIS — I10 Essential (primary) hypertension: Secondary | ICD-10-CM | POA: Insufficient documentation

## 2017-03-30 DIAGNOSIS — E785 Hyperlipidemia, unspecified: Secondary | ICD-10-CM | POA: Diagnosis not present

## 2017-03-30 DIAGNOSIS — Z01810 Encounter for preprocedural cardiovascular examination: Secondary | ICD-10-CM | POA: Diagnosis not present

## 2017-03-31 LAB — VAS US CAROTID
LCCADDIAS: 24 cm/s
LCCADSYS: 78 cm/s
LEFT ECA DIAS: -20 cm/s
LEFT VERTEBRAL DIAS: -9 cm/s
LICADDIAS: -19 cm/s
LICADSYS: -87 cm/s
LICAPDIAS: -165 cm/s
Left CCA prox dias: 16 cm/s
Left CCA prox sys: 77 cm/s
Left ICA prox sys: -448 cm/s
RCCAPDIAS: 26 cm/s
RIGHT CCA MID DIAS: 30 cm/s
RIGHT ECA DIAS: -16 cm/s
RIGHT VERTEBRAL DIAS: 16 cm/s
Right CCA prox sys: 88 cm/s
Right cca dist sys: -76 cm/s

## 2017-04-06 ENCOUNTER — Encounter: Payer: Self-pay | Admitting: Vascular Surgery

## 2017-04-06 ENCOUNTER — Ambulatory Visit (INDEPENDENT_AMBULATORY_CARE_PROVIDER_SITE_OTHER): Payer: Medicare Other | Admitting: Vascular Surgery

## 2017-04-06 ENCOUNTER — Other Ambulatory Visit: Payer: Self-pay | Admitting: *Deleted

## 2017-04-06 ENCOUNTER — Encounter: Payer: Self-pay | Admitting: *Deleted

## 2017-04-06 VITALS — BP 152/97 | HR 71 | Temp 97.4°F | Resp 16 | Ht 67.0 in | Wt 190.8 lb

## 2017-04-06 DIAGNOSIS — I6523 Occlusion and stenosis of bilateral carotid arteries: Secondary | ICD-10-CM

## 2017-04-06 NOTE — Progress Notes (Signed)
Patient is a 77 year old male who returns for postoperative follow-up today after recent right carotid endarterectomy.  This was done for greater than 80% symptomatic stenosis.  He has a known greater than 80% left internal carotid artery stenosis.  Recent repeat duplex scan 1 week ago confirmed this once again.  His right internal carotid artery was widely patent.  He has no incisional drainage.  He has no swallowing difficulties.  He states that he is ready to proceed with left carotid endarterectomy at this point.  He is on aspirin.  Current Outpatient Medications on File Prior to Visit  Medication Sig Dispense Refill  . acetaminophen (TYLENOL) 500 MG tablet Take 1,000 mg by mouth every 6 (six) hours as needed for moderate pain or headache.    Marland Kitchen amLODipine (NORVASC) 10 MG tablet TAKE 1 TABLET BY MOUTH EVERY DAY 90 tablet 4  . aspirin 81 MG tablet Take 81 mg by mouth daily.    . CELEBREX 200 MG capsule Take 200 mg by mouth daily.     . cetirizine (ZYRTEC) 10 MG tablet Take 10 mg by mouth daily.    . Coenzyme Q10 200 MG capsule Take 200 mg by mouth daily.    . Evolocumab with Infusor ([place]) 420 MG/3.5ML SOCT Inject 420 mg into the skin every 30 (thirty) days. 1 Cartridge 12  . hydrochlorothiazide (HYDRODIURIL) 25 MG tablet TAKE 1 TABLET BY MOUTH DAILY (Patient taking differently: TAKE 25 MG BY MOUTH DAILY) 90 tablet 0  . HYDROcodone-acetaminophen (NORCO/VICODIN) 5-325 MG tablet Take 1 tablet by mouth every 4 (four) hours as needed for moderate pain. 6 tablet 0  . lisinopril (PRINIVIL,ZESTRIL) 20 MG tablet TAKE 1 TABLET BY MOUTH EVERY DAY (Patient taking differently: TAKE 20 MG BY MOUTH EVERY DAY) 90 tablet 3  . pravastatin (PRAVACHOL) 40 MG tablet TAKE 1 TABLET BY MOUTH EVERY EVENING 90 tablet 4   No current facility-administered medications on file prior to visit.    Review of systems: He denies shortness of breath.  He does have an occasional twinge in his left chest which  he thinks is most likely secondary to dyspepsia.  He does not really describe classic anginal type chest pain symptoms.  Physical exam:  Vitals:   04/06/17 1236 04/06/17 1239  BP: (!) 147/92 (!) 152/97  Pulse: 71   Resp: 16   Temp: (!) 97.4 F (36.3 C)   TempSrc: Oral   SpO2: 97%   Weight: 190 lb 12.8 oz (86.5 kg)   Height: 5\' 7"  (1.702 m)     Neck: Well-healed right neck incision no left carotid bruit no right bruit  Chest: Clear to auscultation bilaterally  Cardiac: Regular rate and rhythm without murmur  Neuro: Symmetric upper extremity lower extremity motor strength which is 5/5 tongue is midline no facial asymmetry  Assessment: Asymptomatic left internal carotid artery stenosis greater than 80%.  Plan: Left carotid endarterectomy February 27th 2019.  Risk benefits possible complications of procedure details including but not limited to bleeding infection stroke cranial nerve injury myocardial events were discussed with the patient.  He understands and agrees to proceed.  Ruta Hinds, MD [facility] Office: [phone] Pager: [phone]

## 2017-04-10 DIAGNOSIS — R197 Diarrhea, unspecified: Secondary | ICD-10-CM | POA: Diagnosis not present

## 2017-04-10 NOTE — Pre-Procedure Instructions (Signed)
Patrick Vargas  04/10/2017      [id number] - [place], [place] [address] [place] [address] Phone: [phone] Fax: [phone]  [facility] [place], [place] [address] [place] [address] Phone: [phone] Fax: [phone]    Your procedure is scheduled on February, 27 2019.  Report to [facility] Admitting at 530 AM.  Call this number if you have problems the morning of surgery:  [phone]   Remember:  Do not eat food or drink liquids after midnight.  Take these medicines the morning of surgery with A SIP OF WATER acetaminophen (tylenol), amlodipine (norvasc), cetirizine (zyrtec).  7 days prior to surgery STOP taking any celebrex, Aleve, Naproxen, Ibuprofen, Motrin, Advil, Goody's, BC's, all herbal medications, fish oil, and all vitamins  Continue all other medications as instructed by your physician except follow the above medication instructions before surgery   Do not wear jewelry, make-up or nail polish.  Do not wear lotions, powders, or perfumes, or deodorant.  Men may shave face and neck.  Do not bring valuables to the hospital.  [facility] is not responsible for any belongings or valuables.  Contacts, dentures or bridgework may not be worn into surgery.  Leave your suitcase in the car.  After surgery it may be brought to your room.  For patients admitted to the hospital, discharge time will be determined by your treatment team.  Patients discharged the day of surgery will not be allowed to drive home.   Special instructions:   [facility]- Preparing For Surgery  Before surgery, you can play an important role. Because skin is not sterile, your skin needs to be as free of germs as possible. You can reduce the number of germs on your skin by washing with CHG (chlorahexidine gluconate) Soap before surgery.  CHG is an  antiseptic cleaner which kills germs and bonds with the skin to continue killing germs even after washing.  Please do not use if you have an allergy to CHG or antibacterial soaps. If your skin becomes reddened/irritated stop using the CHG.  Do not shave (including legs and underarms) for at least 48 hours prior to first CHG shower. It is OK to shave your face.  Please follow these instructions carefully.   1. Shower the NIGHT BEFORE SURGERY and the MORNING OF SURGERY with CHG.   2. If you chose to wash your hair, wash your hair first as usual with your normal shampoo.  3. After you shampoo, rinse your hair and body thoroughly to remove the shampoo.  4. Use CHG as you would any other liquid soap. You can apply CHG directly to the skin and wash gently with a scrungie or a clean washcloth.   5. Apply the CHG Soap to your body ONLY FROM THE NECK DOWN.  Do not use on open wounds or open sores. Avoid contact with your eyes, ears, mouth and genitals (private parts). Wash Face and genitals (private parts)  with your normal soap.  6. Wash thoroughly, paying special attention to the area where your surgery will be performed.  7. Thoroughly rinse your body with warm water from the neck down.  8. DO NOT shower/wash with your normal soap after using and rinsing off the CHG Soap.  9. Pat yourself dry with a CLEAN TOWEL.  10. Wear CLEAN PAJAMAS to bed the night  before surgery, wear comfortable clothes the morning of surgery  11. Place CLEAN SHEETS on your bed the night of your first shower and DO NOT SLEEP WITH PETS.  Day of Surgery: Do not apply any deodorants/lotions. Please wear clean clothes to the hospital/surgery center.    Please read over the following fact sheets that you were given. Pain Booklet, Coughing and Deep Breathing and MRSA Information

## 2017-04-11 ENCOUNTER — Other Ambulatory Visit: Payer: Self-pay

## 2017-04-11 ENCOUNTER — Encounter (HOSPITAL_COMMUNITY)
Admission: RE | Admit: 2017-04-11 | Discharge: 2017-04-11 | Disposition: A | Discharge status: Home / Self Care | Payer: Medicare Other | Source: Ambulatory Visit | Attending: Vascular Surgery | Admitting: Vascular Surgery

## 2017-04-11 ENCOUNTER — Encounter (HOSPITAL_COMMUNITY): Payer: Self-pay

## 2017-04-11 LAB — TYPE AND SCREEN
ABO/RH(D): A POS
ANTIBODY SCREEN: NEGATIVE

## 2017-04-11 LAB — SURGICAL PCR SCREEN
MRSA, PCR: NEGATIVE
Staphylococcus aureus: NEGATIVE

## 2017-04-11 LAB — COMPREHENSIVE METABOLIC PANEL
ALBUMIN: 3.8 g/dL (ref 3.5–5.0)
ALT: 21 U/L (ref 17–63)
ANION GAP: 12 (ref 5–15)
AST: 27 U/L (ref 15–41)
Alkaline Phosphatase: 64 U/L (ref 38–126)
BILIRUBIN TOTAL: 0.6 mg/dL (ref 0.3–1.2)
BUN: 26 mg/dL — ABNORMAL HIGH (ref 6–20)
CHLORIDE: 99 mmol/L — AB (ref 101–111)
CO2: 26 mmol/L (ref 22–32)
Calcium: 9.4 mg/dL (ref 8.9–10.3)
Creatinine, Ser: 1.8 mg/dL — ABNORMAL HIGH (ref 0.61–1.24)
GFR calc Af Amer: 41 mL/min — ABNORMAL LOW (ref >60)
GFR, EST NON AFRICAN AMERICAN: 35 mL/min — AB (ref >60)
GLUCOSE: 90 mg/dL (ref 65–99)
POTASSIUM: 3.6 mmol/L (ref 3.5–5.1)
Sodium: 137 mmol/L (ref 135–145)
TOTAL PROTEIN: 7.1 g/dL (ref 6.5–8.1)

## 2017-04-11 LAB — CBC
HEMATOCRIT: 43.2 % (ref 39.0–52.0)
Hemoglobin: 14.7 g/dL (ref 13.0–17.0)
MCH: 30.1 pg (ref 26.0–34.0)
MCHC: 34 g/dL (ref 30.0–36.0)
MCV: 88.5 fL (ref 78.0–100.0)
PLATELETS: 235 10*3/uL (ref 150–400)
RBC: 4.88 MIL/uL (ref 4.22–5.81)
RDW: 13.3 % (ref 11.5–15.5)
WBC: 9.7 10*3/uL (ref 4.0–10.5)

## 2017-04-11 LAB — URINALYSIS, ROUTINE W REFLEX MICROSCOPIC
BILIRUBIN URINE: NEGATIVE
Glucose, UA: NEGATIVE mg/dL
Hgb urine dipstick: NEGATIVE
KETONES UR: NEGATIVE mg/dL
Leukocytes, UA: NEGATIVE
Nitrite: NEGATIVE
PH: 5 (ref 5.0–8.0)
Protein, ur: NEGATIVE mg/dL
Specific Gravity, Urine: 1.016 (ref 1.005–1.030)

## 2017-04-11 LAB — APTT: APTT: 36 s (ref 24–36)

## 2017-04-11 LAB — PROTIME-INR
INR: 1.02
PROTHROMBIN TIME: 13.3 s (ref 11.4–15.2)

## 2017-04-11 NOTE — Progress Notes (Signed)
Spoke with Dr. Ola Spurr regarding creatine. Repeat BMET in the morning

## 2017-04-11 NOTE — Progress Notes (Signed)
PCP - Dr. Justin Mend Cardiologist - Dr. Sallyanne Kuster  Chest x-ray - n/a EKG - 03/06/2017 Stress Test - 2004, 2007, in epic ECHO - 2013, in epic Cardiac Cath - patient denies  Sleep Study - patient denies  Anesthesia review: n/a  Patient denies shortness of breath, fever, cough and chest pain at PAT appointment   Patient verbalized understanding of instructions that were given to them at the PAT appointment. Patient was also instructed that they will need to review over the PAT instructions again at home before surgery.

## 2017-04-12 ENCOUNTER — Inpatient Hospital Stay (HOSPITAL_COMMUNITY)
Admission: RE | Admit: 2017-04-12 | Discharge: 2017-04-13 | DRG: 039 | Disposition: A | Discharge status: Home / Self Care | Payer: Medicare Other | Source: Ambulatory Visit | Attending: Vascular Surgery | Admitting: Vascular Surgery

## 2017-04-12 ENCOUNTER — Inpatient Hospital Stay (HOSPITAL_COMMUNITY): Payer: Medicare Other | Admitting: Certified Registered Nurse Anesthetist

## 2017-04-12 ENCOUNTER — Encounter (HOSPITAL_COMMUNITY)
Admission: RE | Disposition: A | Discharge status: Home / Self Care | Payer: Self-pay | Source: Ambulatory Visit | Attending: Vascular Surgery

## 2017-04-12 ENCOUNTER — Encounter (HOSPITAL_COMMUNITY): Payer: Self-pay | Admitting: *Deleted

## 2017-04-12 DIAGNOSIS — Z6829 Body mass index (BMI) 29.0-29.9, adult: Secondary | ICD-10-CM | POA: Diagnosis not present

## 2017-04-12 DIAGNOSIS — I10 Essential (primary) hypertension: Secondary | ICD-10-CM | POA: Diagnosis present

## 2017-04-12 DIAGNOSIS — I959 Hypotension, unspecified: Secondary | ICD-10-CM | POA: Diagnosis not present

## 2017-04-12 DIAGNOSIS — I6523 Occlusion and stenosis of bilateral carotid arteries: Secondary | ICD-10-CM | POA: Diagnosis not present

## 2017-04-12 DIAGNOSIS — I493 Ventricular premature depolarization: Secondary | ICD-10-CM | POA: Diagnosis present

## 2017-04-12 DIAGNOSIS — Z79899 Other long term (current) drug therapy: Secondary | ICD-10-CM | POA: Diagnosis not present

## 2017-04-12 DIAGNOSIS — K219 Gastro-esophageal reflux disease without esophagitis: Secondary | ICD-10-CM | POA: Diagnosis present

## 2017-04-12 DIAGNOSIS — E7800 Pure hypercholesterolemia, unspecified: Secondary | ICD-10-CM | POA: Diagnosis not present

## 2017-04-12 DIAGNOSIS — I6529 Occlusion and stenosis of unspecified carotid artery: Secondary | ICD-10-CM | POA: Diagnosis present

## 2017-04-12 DIAGNOSIS — E663 Overweight: Secondary | ICD-10-CM | POA: Diagnosis present

## 2017-04-12 DIAGNOSIS — E785 Hyperlipidemia, unspecified: Secondary | ICD-10-CM | POA: Diagnosis not present

## 2017-04-12 DIAGNOSIS — Z7982 Long term (current) use of aspirin: Secondary | ICD-10-CM

## 2017-04-12 DIAGNOSIS — Z96651 Presence of right artificial knee joint: Secondary | ICD-10-CM | POA: Diagnosis not present

## 2017-04-12 DIAGNOSIS — I6522 Occlusion and stenosis of left carotid artery: Secondary | ICD-10-CM | POA: Diagnosis not present

## 2017-04-12 HISTORY — PX: ENDARTERECTOMY: SHX5162

## 2017-04-12 HISTORY — PX: PATCH ANGIOPLASTY: SHX6230

## 2017-04-12 LAB — BASIC METABOLIC PANEL
Anion gap: 11 (ref 5–15)
BUN: 27 mg/dL — AB (ref 6–20)
CALCIUM: 9.4 mg/dL (ref 8.9–10.3)
CO2: 23 mmol/L (ref 22–32)
CREATININE: 1.41 mg/dL — AB (ref 0.61–1.24)
Chloride: 103 mmol/L (ref 101–111)
GFR calc non Af Amer: 48 mL/min — ABNORMAL LOW (ref >60)
GFR, EST AFRICAN AMERICAN: 55 mL/min — AB (ref >60)
Glucose, Bld: 98 mg/dL (ref 65–99)
Potassium: 3.6 mmol/L (ref 3.5–5.1)
SODIUM: 137 mmol/L (ref 135–145)

## 2017-04-12 SURGERY — ENDARTERECTOMY, CAROTID
Anesthesia: General | Site: Neck | Laterality: Left

## 2017-04-12 MED ORDER — LIDOCAINE HCL 1 % IJ SOLN
INTRAMUSCULAR | Status: AC
  Filled 2017-04-12: qty 20

## 2017-04-12 MED ORDER — ROCURONIUM BROMIDE 10 MG/ML (PF) SYRINGE
PREFILLED_SYRINGE | INTRAVENOUS | Status: AC
  Filled 2017-04-12: qty 5

## 2017-04-12 MED ORDER — LIDOCAINE 2% (20 MG/ML) 5 ML SYRINGE
INTRAMUSCULAR | Status: DC | PRN
  Administered 2017-04-12: 60 mg via INTRAVENOUS

## 2017-04-12 MED ORDER — SODIUM CHLORIDE 0.9 % IV SOLN
0.0500 ug/kg/min | INTRAVENOUS | Status: AC
  Administered 2017-04-12: .2 ug/kg/min via INTRAVENOUS
  Filled 2017-04-12: qty 5000

## 2017-04-12 MED ORDER — LIDOCAINE 2% (20 MG/ML) 5 ML SYRINGE
INTRAMUSCULAR | Status: AC
Start: 2017-04-12 — End: 2017-04-12
  Filled 2017-04-12: qty 5

## 2017-04-12 MED ORDER — ONDANSETRON HCL 4 MG/2ML IJ SOLN: 4.0000 mg | Freq: Four times a day (QID) | INTRAMUSCULAR | Status: DC | PRN

## 2017-04-12 MED ORDER — PANTOPRAZOLE SODIUM 40 MG PO TBEC
40.0000 mg | DELAYED_RELEASE_TABLET | Freq: Every day | ORAL | Status: DC
  Administered 2017-04-12 – 2017-04-13 (×2): 40 mg via ORAL
  Filled 2017-04-12 (×2): qty 1

## 2017-04-12 MED ORDER — HYDROCHLOROTHIAZIDE 25 MG PO TABS
25.0000 mg | ORAL_TABLET | Freq: Every day | ORAL | Status: DC
  Administered 2017-04-12 – 2017-04-13 (×2): 25 mg via ORAL
  Filled 2017-04-12 (×2): qty 1

## 2017-04-12 MED ORDER — PROTAMINE SULFATE 10 MG/ML IV SOLN
INTRAVENOUS | Status: DC | PRN
  Administered 2017-04-12: 10 mg via INTRAVENOUS
  Administered 2017-04-12: 20 mg via INTRAVENOUS
  Administered 2017-04-12: 10 mg via INTRAVENOUS
  Administered 2017-04-12 (×2): 20 mg via INTRAVENOUS
  Administered 2017-04-12: 10 mg via INTRAVENOUS

## 2017-04-12 MED ORDER — HEPARIN SODIUM (PORCINE) 1000 UNIT/ML IJ SOLN
INTRAMUSCULAR | Status: AC
  Filled 2017-04-12: qty 1

## 2017-04-12 MED ORDER — AMLODIPINE BESYLATE 10 MG PO TABS: 10.0000 mg | ORAL_TABLET | Freq: Every day | ORAL | Status: DC

## 2017-04-12 MED ORDER — MORPHINE SULFATE (PF) 4 MG/ML IV SOLN: 4.0000 mg | INTRAVENOUS | Status: DC | PRN

## 2017-04-12 MED ORDER — CELECOXIB 200 MG PO CAPS
200.0000 mg | ORAL_CAPSULE | Freq: Every day | ORAL | Status: DC
  Administered 2017-04-12 – 2017-04-13 (×2): 200 mg via ORAL
  Filled 2017-04-12 (×2): qty 1

## 2017-04-12 MED ORDER — PHENOL 1.4 % MT LIQD: 1.0000 | OROMUCOSAL | Status: DC | PRN

## 2017-04-12 MED ORDER — PROTAMINE SULFATE 10 MG/ML IV SOLN
INTRAVENOUS | Status: AC
  Filled 2017-04-12: qty 5

## 2017-04-12 MED ORDER — AMLODIPINE BESYLATE 10 MG PO TABS
10.0000 mg | ORAL_TABLET | Freq: Every day | ORAL | Status: DC
  Administered 2017-04-13: 10 mg via ORAL
  Filled 2017-04-12: qty 1

## 2017-04-12 MED ORDER — PROPOFOL 10 MG/ML IV BOLUS
INTRAVENOUS | Status: AC
Start: 2017-04-12 — End: 2017-04-12
  Filled 2017-04-12: qty 20

## 2017-04-12 MED ORDER — PHENYLEPHRINE 40 MCG/ML (10ML) SYRINGE FOR IV PUSH (FOR BLOOD PRESSURE SUPPORT)
PREFILLED_SYRINGE | INTRAVENOUS | Status: DC | PRN
  Administered 2017-04-12 (×2): 40 ug via INTRAVENOUS

## 2017-04-12 MED ORDER — PROTAMINE SULFATE 10 MG/ML IV SOLN
INTRAVENOUS | Status: AC
Start: 2017-04-12 — End: 2017-04-12
  Filled 2017-04-12: qty 5

## 2017-04-12 MED ORDER — CHLORHEXIDINE GLUCONATE CLOTH 2 % EX PADS: 6.0000 | MEDICATED_PAD | Freq: Once | CUTANEOUS | Status: DC

## 2017-04-12 MED ORDER — EPHEDRINE SULFATE 50 MG/ML IJ SOLN
INTRAMUSCULAR | Status: DC | PRN
Start: 2017-04-12 — End: 2017-04-12

## 2017-04-12 MED ORDER — HYDRALAZINE HCL 20 MG/ML IJ SOLN: 5.0000 mg | INTRAMUSCULAR | Status: DC | PRN

## 2017-04-12 MED ORDER — HEPARIN SODIUM (PORCINE) 1000 UNIT/ML IJ SOLN
INTRAMUSCULAR | Status: DC | PRN
  Administered 2017-04-12: 9000 [IU] via INTRAVENOUS

## 2017-04-12 MED ORDER — CLINDAMYCIN PHOSPHATE 300 MG/50ML IV SOLN
300.0000 mg | Freq: Four times a day (QID) | INTRAVENOUS | Status: AC
  Administered 2017-04-12 – 2017-04-13 (×3): 300 mg via INTRAVENOUS
  Filled 2017-04-12 (×3): qty 50

## 2017-04-12 MED ORDER — ACETAMINOPHEN 500 MG PO TABS: 1000.0000 mg | ORAL_TABLET | Freq: Four times a day (QID) | ORAL | Status: DC | PRN

## 2017-04-12 MED ORDER — SODIUM CHLORIDE 0.9 % IV SOLN: INTRAVENOUS | Status: DC

## 2017-04-12 MED ORDER — ASPIRIN EC 325 MG PO TBEC
DELAYED_RELEASE_TABLET | ORAL | Status: AC
  Filled 2017-04-12: qty 1

## 2017-04-12 MED ORDER — POTASSIUM CHLORIDE CRYS ER 20 MEQ PO TBCR: 20.0000 meq | EXTENDED_RELEASE_TABLET | Freq: Every day | ORAL | Status: DC | PRN

## 2017-04-12 MED ORDER — METOPROLOL TARTRATE 5 MG/5ML IV SOLN: 2.0000 mg | INTRAVENOUS | Status: DC | PRN

## 2017-04-12 MED ORDER — PROPOFOL 10 MG/ML IV BOLUS
INTRAVENOUS | Status: DC | PRN
  Administered 2017-04-12: 40 mg via INTRAVENOUS
  Administered 2017-04-12: 20 mg via INTRAVENOUS
  Administered 2017-04-12: 10 mg via INTRAVENOUS

## 2017-04-12 MED ORDER — ONDANSETRON HCL 4 MG/2ML IJ SOLN
INTRAMUSCULAR | Status: AC
  Filled 2017-04-12: qty 2

## 2017-04-12 MED ORDER — FENTANYL CITRATE (PF) 250 MCG/5ML IJ SOLN
INTRAMUSCULAR | Status: DC | PRN
  Administered 2017-04-12: 50 ug via INTRAVENOUS

## 2017-04-12 MED ORDER — ROCURONIUM BROMIDE 10 MG/ML (PF) SYRINGE
PREFILLED_SYRINGE | INTRAVENOUS | Status: DC | PRN
  Administered 2017-04-12: 60 mg via INTRAVENOUS

## 2017-04-12 MED ORDER — VANCOMYCIN HCL IN DEXTROSE 1-5 GM/200ML-% IV SOLN
1000.0000 mg | INTRAVENOUS | Status: AC
  Administered 2017-04-12: 1000 mg via INTRAVENOUS
  Filled 2017-04-12: qty 200

## 2017-04-12 MED ORDER — DEXAMETHASONE SODIUM PHOSPHATE 10 MG/ML IJ SOLN
INTRAMUSCULAR | Status: AC
  Filled 2017-04-12: qty 1

## 2017-04-12 MED ORDER — PHENYLEPHRINE HCL 10 MG/ML IJ SOLN
INTRAVENOUS | Status: DC | PRN
  Administered 2017-04-12: 20 ug/min via INTRAVENOUS
  Administered 2017-04-12: 10:00:00 via INTRAVENOUS

## 2017-04-12 MED ORDER — LACTATED RINGERS IV SOLN
INTRAVENOUS | Status: DC | PRN
Start: 2017-04-12 — End: 2017-04-12
  Administered 2017-04-12: 07:00:00 via INTRAVENOUS

## 2017-04-12 MED ORDER — SODIUM CHLORIDE 0.9 % IV SOLN
INTRAVENOUS | Status: DC | PRN
  Administered 2017-04-12: 500 mL

## 2017-04-12 MED ORDER — DOCUSATE SODIUM 100 MG PO CAPS
100.0000 mg | ORAL_CAPSULE | Freq: Every day | ORAL | Status: DC
  Administered 2017-04-13: 100 mg via ORAL
  Filled 2017-04-12: qty 1

## 2017-04-12 MED ORDER — DEXAMETHASONE SODIUM PHOSPHATE 10 MG/ML IJ SOLN
INTRAMUSCULAR | Status: DC | PRN
Start: 2017-04-12 — End: 2017-04-12
  Administered 2017-04-12: 10 mg via INTRAVENOUS

## 2017-04-12 MED ORDER — LACTATED RINGERS IV SOLN
INTRAVENOUS | Status: DC | PRN
  Administered 2017-04-12: 07:00:00 via INTRAVENOUS

## 2017-04-12 MED ORDER — ALUM & MAG HYDROXIDE-SIMETH 200-200-20 MG/5ML PO SUSP: 15.0000 mL | ORAL | Status: DC | PRN

## 2017-04-12 MED ORDER — SUGAMMADEX SODIUM 200 MG/2ML IV SOLN
INTRAVENOUS | Status: DC | PRN
  Administered 2017-04-12: 200 mg via INTRAVENOUS

## 2017-04-12 MED ORDER — LABETALOL HCL 5 MG/ML IV SOLN: 10.0000 mg | INTRAVENOUS | Status: DC | PRN

## 2017-04-12 MED ORDER — GLYCOPYRROLATE 0.2 MG/ML IV SOSY
PREFILLED_SYRINGE | INTRAVENOUS | Status: DC | PRN
  Administered 2017-04-12: .1 mg via INTRAVENOUS

## 2017-04-12 MED ORDER — EPHEDRINE 5 MG/ML INJ
INTRAVENOUS | Status: AC
  Filled 2017-04-12: qty 10

## 2017-04-12 MED ORDER — LISINOPRIL 10 MG PO TABS
20.0000 mg | ORAL_TABLET | Freq: Every day | ORAL | Status: DC
Start: 2017-04-12 — End: 2017-04-13
  Administered 2017-04-12 – 2017-04-13 (×2): 20 mg via ORAL
  Filled 2017-04-12 (×2): qty 2

## 2017-04-12 MED ORDER — GUAIFENESIN-DM 100-10 MG/5ML PO SYRP: 15.0000 mL | ORAL_SOLUTION | ORAL | Status: DC | PRN

## 2017-04-12 MED ORDER — TRAMADOL HCL 50 MG PO TABS: 50.0000 mg | ORAL_TABLET | Freq: Four times a day (QID) | ORAL | Status: DC | PRN

## 2017-04-12 MED ORDER — SODIUM CHLORIDE 0.9 % IV SOLN
INTRAVENOUS | Status: DC
  Administered 2017-04-12: 18:00:00 via INTRAVENOUS

## 2017-04-12 MED ORDER — ONDANSETRON HCL 4 MG/2ML IJ SOLN
INTRAMUSCULAR | Status: DC | PRN
  Administered 2017-04-12: 4 mg via INTRAVENOUS

## 2017-04-12 MED ORDER — ASPIRIN EC 81 MG PO TBEC
81.0000 mg | DELAYED_RELEASE_TABLET | Freq: Every day | ORAL | Status: DC
  Administered 2017-04-12: 81 mg via ORAL
  Filled 2017-04-12: qty 1

## 2017-04-12 MED ORDER — PRAVASTATIN SODIUM 40 MG PO TABS
40.0000 mg | ORAL_TABLET | Freq: Every evening | ORAL | Status: DC
  Administered 2017-04-12: 40 mg via ORAL
  Filled 2017-04-12: qty 1

## 2017-04-12 MED ORDER — 0.9 % SODIUM CHLORIDE (POUR BTL) OPTIME
TOPICAL | Status: DC | PRN
  Administered 2017-04-12 (×2): 1000 mL

## 2017-04-12 MED ORDER — EPHEDRINE SULFATE-NACL 50-0.9 MG/10ML-% IV SOSY
PREFILLED_SYRINGE | INTRAVENOUS | Status: DC | PRN
  Administered 2017-04-12 (×2): 5 mg via INTRAVENOUS
  Administered 2017-04-12: 10 mg via INTRAVENOUS

## 2017-04-12 MED ORDER — SODIUM CHLORIDE 0.9 % IV SOLN
500.0000 mL | Freq: Once | INTRAVENOUS | Status: AC | PRN
  Administered 2017-04-12 (×2): 500 mL via INTRAVENOUS

## 2017-04-12 MED ORDER — FENTANYL CITRATE (PF) 250 MCG/5ML IJ SOLN
INTRAMUSCULAR | Status: AC
  Filled 2017-04-12: qty 5

## 2017-04-12 MED ORDER — PHENYLEPHRINE HCL 10 MG/ML IJ SOLN: INTRAMUSCULAR | Status: DC | PRN

## 2017-04-12 MED ORDER — SUGAMMADEX SODIUM 200 MG/2ML IV SOLN
INTRAVENOUS | Status: AC
  Filled 2017-04-12: qty 2

## 2017-04-12 MED ORDER — ASPIRIN 325 MG PO TABS
325.0000 mg | ORAL_TABLET | ORAL | Status: AC
  Administered 2017-04-12: 325 mg via ORAL
  Filled 2017-04-12: qty 1

## 2017-04-12 MED ORDER — LORATADINE 10 MG PO TABS
10.0000 mg | ORAL_TABLET | Freq: Every day | ORAL | Status: DC
  Administered 2017-04-12 – 2017-04-13 (×2): 10 mg via ORAL
  Filled 2017-04-12 (×2): qty 1

## 2017-04-12 MED ORDER — MAGNESIUM SULFATE 2 GM/50ML IV SOLN: 2.0000 g | Freq: Every day | INTRAVENOUS | Status: DC | PRN

## 2017-04-12 MED ORDER — PHENYLEPHRINE 40 MCG/ML (10ML) SYRINGE FOR IV PUSH (FOR BLOOD PRESSURE SUPPORT)
PREFILLED_SYRINGE | INTRAVENOUS | Status: AC
  Filled 2017-04-12: qty 10

## 2017-04-12 MED ORDER — DEXTRAN 40 IN D5W 10 % IV SOLN
INTRAVENOUS | Status: AC
  Filled 2017-04-12: qty 500

## 2017-04-12 MED ORDER — GLYCOPYRROLATE 0.2 MG/ML IJ SOLN: INTRAMUSCULAR | Status: DC | PRN

## 2017-04-12 SURGICAL SUPPLY — 46 items
CANISTER SUCT 3000ML PPV (MISCELLANEOUS) ×2 IMPLANT
CANNULA VESSEL 3MM 2 BLNT TIP (CANNULA) ×4 IMPLANT
CATH ROBINSON RED A/P 18FR (CATHETERS) ×2 IMPLANT
CLIP VESOCCLUDE MED 6/CT (CLIP) ×2 IMPLANT
CLIP VESOCCLUDE SM WIDE 6/CT (CLIP) ×2 IMPLANT
CRADLE DONUT ADULT HEAD (MISCELLANEOUS) ×2 IMPLANT
DECANTER SPIKE VIAL GLASS SM (MISCELLANEOUS) IMPLANT
DERMABOND ADVANCED (GAUZE/BANDAGES/DRESSINGS) ×1
DERMABOND ADVANCED .7 DNX12 (GAUZE/BANDAGES/DRESSINGS) ×1 IMPLANT
DRAIN HEMOVAC 1/8 X 5 (WOUND CARE) IMPLANT
ELECT REM PT RETURN 9FT ADLT (ELECTROSURGICAL) ×2
ELECTRODE REM PT RTRN 9FT ADLT (ELECTROSURGICAL) ×1 IMPLANT
EVACUATOR SILICONE 100CC (DRAIN) IMPLANT
GLOVE BIO SURGEON STRL SZ 6.5 (GLOVE) ×2 IMPLANT
GLOVE BIO SURGEON STRL SZ7 (GLOVE) ×2 IMPLANT
GLOVE BIO SURGEON STRL SZ7.5 (GLOVE) ×2 IMPLANT
GLOVE BIOGEL PI IND STRL 6.5 (GLOVE) ×4 IMPLANT
GLOVE BIOGEL PI INDICATOR 6.5 (GLOVE) ×4
GLOVE ECLIPSE 6.5 STRL STRAW (GLOVE) ×2 IMPLANT
GLOVE SURG SS PI 6.0 STRL IVOR (GLOVE) ×4 IMPLANT
GOWN STRL REUS W/ TWL LRG LVL3 (GOWN DISPOSABLE) ×5 IMPLANT
GOWN STRL REUS W/TWL LRG LVL3 (GOWN DISPOSABLE) ×5
HEMOSTAT SPONGE AVITENE ULTRA (HEMOSTASIS) IMPLANT
KIT BASIN OR (CUSTOM PROCEDURE TRAY) ×2 IMPLANT
KIT ROOM TURNOVER OR (KITS) ×2 IMPLANT
KIT SHUNT ARGYLE CAROTID ART 6 (VASCULAR PRODUCTS) IMPLANT
NEEDLE HYPO 25GX1X1/2 BEV (NEEDLE) IMPLANT
NS IRRIG 1000ML POUR BTL (IV SOLUTION) ×4 IMPLANT
PACK CAROTID (CUSTOM PROCEDURE TRAY) ×2 IMPLANT
PAD ARMBOARD 7.5X6 YLW CONV (MISCELLANEOUS) ×4 IMPLANT
PATCH HEMASHIELD 8X75 (Vascular Products) ×2 IMPLANT
SHUNT CAROTID BYPASS 10 (VASCULAR PRODUCTS) ×2 IMPLANT
SHUNT CAROTID BYPASS 12FRX15.5 (VASCULAR PRODUCTS) IMPLANT
SUT ETHILON 3 0 PS 1 (SUTURE) IMPLANT
SUT PROLENE 5 0 C 1 24 (SUTURE) ×2 IMPLANT
SUT PROLENE 6 0 CC (SUTURE) ×4 IMPLANT
SUT PROLENE 7 0 BV 1 (SUTURE) ×4 IMPLANT
SUT PROLENE 7 0 BV1 MDA (SUTURE) ×2 IMPLANT
SUT SILK 3 0 TIES 17X18 (SUTURE)
SUT SILK 3-0 18XBRD TIE BLK (SUTURE) IMPLANT
SUT VIC AB 3-0 SH 27 (SUTURE) ×1
SUT VIC AB 3-0 SH 27X BRD (SUTURE) ×1 IMPLANT
SUT VICRYL 4-0 PS2 18IN ABS (SUTURE) ×2 IMPLANT
SYR CONTROL 10ML LL (SYRINGE) IMPLANT
TOWEL GREEN STERILE (TOWEL DISPOSABLE) ×2 IMPLANT
WATER STERILE IRR 1000ML POUR (IV SOLUTION) ×2 IMPLANT

## 2017-04-12 NOTE — Progress Notes (Signed)
Patient arrived on the unit from PACU on a hospital bed, assessment completed see flowsheet, placed on tele ccmd notified, patient oriented to room and staff, bed in lowest position call bell in reach , CHG bath completed, will continue to monitor.

## 2017-04-12 NOTE — Progress Notes (Signed)
  Day of Surgery Note    Subjective:  No pain from incision site.  Denies slurring speech, changes in vision, or one sided weakness.   Vitals:   04/12/17 1215 04/12/17 1216  BP:  (!) 106/57  Pulse: (!) 52 (!) 53  Resp: 15 17  Temp:    SpO2: 100% 100%    Incisions:   L neck incision intact with minimal local swelling; trachea midline Extremities:  Moving all extremities well; symmetric grip strength Lungs:  Non labored Abdomen:  Soft Neurologic: CN III-XII grossly intact   Assessment/Plan:  This is a 77 y.o. male who is s/p  L carotid endarterectomy  -Neuro exam at baseline Incision site is unremarkable Receiving 500L bolus x2 due to SBP around 100; prn hypotension medications ordered to keep SBP 100-181mmHg To 4E when bed available; d/c tomorrow if no events overnight   Dagoberto Ligas, PA-C 04/12/2017 12:25 PM [phone]

## 2017-04-12 NOTE — Interval H&P Note (Signed)
History and Physical Interval Note:  04/12/2017 7:20 AM  Patrick Vargas  has presented today for surgery, with the diagnosis of left carotid stenosis  The various methods of treatment have been discussed with the patient and family. After consideration of risks, benefits and other options for treatment, the patient has consented to  Procedure(s): ENDARTERECTOMY CAROTID (Left) as a surgical intervention .  The patient's history has been reviewed, patient examined, no change in status, stable for surgery.  I have reviewed the patient's chart and labs.  Questions were answered to the patient's satisfaction.     Ruta Hinds

## 2017-04-12 NOTE — Anesthesia Procedure Notes (Signed)
Arterial Line Insertion Start/End2/27/2019 7:00 AM, 04/12/2017 7:14 AM Performed by: Josephine Igo, CRNA, CRNA  Patient location: Pre-op. Preanesthetic checklist: patient identified, IV checked, site marked, risks and benefits discussed, surgical consent, monitors and equipment checked, pre-op evaluation and anesthesia consent Lidocaine 1% used for infiltration Right, radial was placed Catheter size: 20 G Hand hygiene performed  and maximum sterile barriers used   Attempts: 4 Procedure performed without using ultrasound guided technique. Ultrasound Notes:anatomy identified, needle tip was noted to be adjacent to the nerve/plexus identified and no ultrasound evidence of intravascular and/or intraneural injection Following insertion, dressing applied and Biopatch. Post procedure assessment: normal  Patient tolerated the procedure well with no immediate complications.

## 2017-04-12 NOTE — Anesthesia Preprocedure Evaluation (Addendum)
Anesthesia Evaluation  Patient identified by MRN, date of birth, ID band Patient awake    Reviewed: Allergy & Precautions, NPO status , Patient's Chart, lab work & pertinent test results  Airway Mallampati: II  TM Distance: >3 FB Neck ROM: Full    Dental  (+) Teeth Intact, Dental Advisory Given   Pulmonary    breath sounds clear to auscultation       Cardiovascular hypertension, + Peripheral Vascular Disease   Rhythm:Regular     Neuro/Psych  Headaches,    GI/Hepatic GERD  Medicated,  Endo/Other    Renal/GU      Musculoskeletal  (+) Arthritis ,   Abdominal   Peds  Hematology   Anesthesia Other Findings   Reproductive/Obstetrics                            Anesthesia Physical Anesthesia Plan  ASA: III  Anesthesia Plan: General   Post-op Pain Management:    Induction: Intravenous  PONV Risk Score and Plan: 2 and Ondansetron and Dexamethasone  Airway Management Planned: Oral ETT  Additional Equipment: Arterial line  Intra-op Plan:   Post-operative Plan: Extubation in OR  Informed Consent: I have reviewed the patients History and Physical, chart, labs and discussed the procedure including the risks, benefits and alternatives for the proposed anesthesia with the patient or authorized representative who has indicated his/her understanding and acceptance.   Dental advisory given  Plan Discussed with: CRNA, Anesthesiologist and Surgeon  Anesthesia Plan Comments:        Anesthesia Quick Evaluation

## 2017-04-12 NOTE — Anesthesia Procedure Notes (Signed)

## 2017-04-12 NOTE — Op Note (Addendum)
Procedure: Left carotid endarterectomy  Preoperative diagnosis: Asymptomatic left internal carotid artery stenosis  Postoperative diagnosis: Same  Anesthesia General  Asst.: Arlee Muslim, PAC  Operative findings: #1 greater than 80% left internal carotid stenosis                                                             #2 Dacron patch   Operative details: After obtaining informed consent, the patient was taken to the operating room. The patient was placed in a supine position on the operating room table. After induction of general anesthesia, the patient's entire neck and chest was prepped and draped in the usual sterile fashion. An oblique incision was made on the left aspect of the patient's neck anterior to the border the left sternocleidomastoid muscle. The incision was carried into the subcutaneous tissues and through the platysma. The sternocleidomastoid muscle was identified and reflected laterally.  The common carotid artery was then found at the base of the incision this was dissected free circumferentially. It was fairly soft on palpation.  The vagus nerve was identified and protected. Dissection was then carried up to the level carotid bifurcation.   The hyperglossal nerve was above the primary area of dissection.  The internal carotid artery was dissected free circumferentially just below the level of the hypoglossal nerve and it was soft in character at this location and above any palpable disease. A vessel loop was placed around this. Next the external carotid and superior thyroid arteries were dissected free circumferentially and vessel loops were placed around these. The patient was given 9000 units of intravenous heparin.  After 2 minutes of circulation time and raising the mean arterial pressure to 90 mm mercury, the distal internal carotid artery was controlled with small bulldog clamp. The external carotid and superior thyroid arteries were controlled with vessel loops. The common  carotid artery was controlled with a peripheral DeBakey clamp. A longitudinal opening was made in the common carotid artery just below the bifurcation. The arteriotomy was extended distally up into the internal carotid with Potts scissors. There was a large calcified plaque with greater than 80% stenosis in the internal carotid.  A 10 Fr shunt was brought onto the field and fashioned to fit the patient's artery.  This was threaded into the distal internal carotid artery but it would not advance easily.  There was good pulsatile backbleeding.  We kept the MAP above 90 mm Hg.  Attention was then turned to the common carotid artery once again. A suitable endarterectomy plane was obtained and endarterectomy was begun in the common carotid artery and a good proximal endpoint was obtained. An eversion endarterectomy was performed on the external carotid artery and a good endpoint was obtained. The plaque was then elevated in the internal carotid artery and a nice feathered distal endpoint was also obtained.  It was lifting slightly so I tacked it with 4 7 0 prolenes on the posterior wall.  The plaque was passed off the table. All loose debris was then removed from the carotid bed and everything was thoroughly irrigated with heparinized saline. A Dacron patch was then brought on to the operative field and this was sewn on as a patch angioplasty using a running 6-0 Prolene suture. Prior to completion of the anastomosis the internal carotid  artery was thoroughly backbled. This was then controlled again with a fine bulldog clamp.  The common carotid was thoroughly flushed forward. The external carotid was also thoroughly backbled.  The remainder of the patch was completed and the anastomosis was secured. Flow was then restored first retrograde from the external carotid into the carotid bed then antegrade from the common carotid to the external carotid artery and after approximately 5 cardiac cycles to the internal carotid  artery. Doppler was used to evaluate the external/internal and common carotid arteries and these all had good Doppler flow. Hemostasis was obtained with 3 additional repair sutures. The patient was also given 90 mg of Protamine.      The platysma muscle was reapproximated using a running 3-0 Vicryl suture. The skin was closed with 4 0 Vicryl subcuticular stitch.  The patient was awakened in the operating room and was moving upper and lower extremities symmetrically and following commands.  The patient was stable on arrival to the PACU.  Ruta Hinds, MD [facility] Office: [phone] Pager: [phone]

## 2017-04-12 NOTE — Progress Notes (Signed)
Matt PA at bedside, made aware of 500L bolus x 2 due to SBP less than 100. Goal of SBP 100-127mmHg. Will continue to monitor and transport to 4e when bed available.

## 2017-04-12 NOTE — Transfer of Care (Signed)
Immediate Anesthesia Transfer of Care Note  Patient: [name]  Procedure(s) Performed: ENDARTERECTOMY CAROTID LEFT (Left Neck) PATCH ANGIOPLASTY LEFT CAROTID ARTERY (Left Neck)  Patient Location: PACU  Anesthesia Type:General  Level of Consciousness: awake, alert  and oriented  Airway & Oxygen Therapy: Patient Spontanous Breathing and Patient connected to face mask oxygen  Post-op Assessment: Report given to RN, Post -op Vital signs reviewed and stable, Patient moving all extremities X 4 and Patient able to stick tongue midline  Post vital signs: Reviewed and stable  Last Vitals:  Vitals:   04/12/17 0554  BP: 116/75  Pulse: 76  Resp: 18  Temp: 36.8 C  SpO2: 99%    Last Pain:  Vitals:   04/12/17 0554  TempSrc: Oral         Complications: No apparent anesthesia complications

## 2017-04-13 ENCOUNTER — Encounter (HOSPITAL_COMMUNITY): Payer: Self-pay | Admitting: Vascular Surgery

## 2017-04-13 ENCOUNTER — Telehealth: Payer: Self-pay | Admitting: Vascular Surgery

## 2017-04-13 LAB — CBC
HEMATOCRIT: 33.5 % — AB (ref 39.0–52.0)
Hemoglobin: 11.1 g/dL — ABNORMAL LOW (ref 13.0–17.0)
MCH: 29.1 pg (ref 26.0–34.0)
MCHC: 33.1 g/dL (ref 30.0–36.0)
MCV: 87.7 fL (ref 78.0–100.0)
Platelets: 177 10*3/uL (ref 150–400)
RBC: 3.82 MIL/uL — ABNORMAL LOW (ref 4.22–5.81)
RDW: 13.4 % (ref 11.5–15.5)
WBC: 14.2 10*3/uL — AB (ref 4.0–10.5)

## 2017-04-13 LAB — BASIC METABOLIC PANEL
ANION GAP: 8 (ref 5–15)
BUN: 23 mg/dL — ABNORMAL HIGH (ref 6–20)
CALCIUM: 8.7 mg/dL — AB (ref 8.9–10.3)
CHLORIDE: 107 mmol/L (ref 101–111)
CO2: 22 mmol/L (ref 22–32)
Creatinine, Ser: 1.32 mg/dL — ABNORMAL HIGH (ref 0.61–1.24)
GFR calc Af Amer: 60 mL/min — ABNORMAL LOW (ref >60)
GFR calc non Af Amer: 51 mL/min — ABNORMAL LOW (ref >60)
GLUCOSE: 146 mg/dL — AB (ref 65–99)
POTASSIUM: 3.8 mmol/L (ref 3.5–5.1)
Sodium: 137 mmol/L (ref 135–145)

## 2017-04-13 MED ORDER — TRAMADOL HCL 50 MG PO TABS: 50.0000 mg | ORAL_TABLET | Freq: Four times a day (QID) | ORAL | 0 refills | Status: DC | PRN

## 2017-04-13 NOTE — Discharge Summary (Signed)
Discharge Summary     Patrick Vargas February 27, 1940 77 y.o. male  [id number]  Admission Date: 04/12/2017  Discharge Date: 04/13/17  Physician: Elam Dutch, MD  Admission Diagnosis: left carotid stenosis  Discharge Day services:   See progress note 04/13/17 Physical Exam: Vitals:   04/13/17 0522 04/13/17 0900  BP: 106/60 113/67  Pulse: 76 80  Resp: (!) 21 16  Temp: 98.2 F (36.8 C)   SpO2: 99% 99%    Hospital Course:  The patient was admitted to the hospital and taken to the operating room on 04/12/2017 and underwent left carotid endarterectomy.  He is status post right carotid endarterectomy about 5 weeks prior. the pt tolerated the procedure well and was transported to the PACU in fair condition.   By POD 1, the pt neuro status remains intact and at baseline.  He denies strokelike symptoms including one-sided weakness, slurring speech, or changes in vision.  He will ambulate with nursing staff this morning and if ambulating without difficulty will be discharged home today.  He will be prescribed 2 days of narcotic pain medication for continued postoperative pain control.  He will follow-up in office in about 2 weeks with Dr. Oneida Alar.  Discharge instructions were reviewed with the patient and he voices his understanding.  He will be discharged home this morning if ambulating without difficulty in stable condition.   Recent Labs    04/12/17 0605 04/13/17 0232  NA 137 137  K 3.6 3.8  CL 103 107  CO2 23 22  GLUCOSE 98 146*  BUN 27* 23*  CALCIUM 9.4 8.7*   Recent Labs    04/11/17 1553 04/13/17 0232  WBC 9.7 14.2*  HGB 14.7 11.1*  HCT 43.2 33.5*  PLT 235 177   Recent Labs    04/11/17 1553  INR 1.02       Discharge Diagnosis:  left carotid stenosis  Secondary Diagnosis: Patient Active Problem List   Diagnosis Date Noted  . Carotid artery stenosis 04/12/2017  . Carotid artery stenosis, symptomatic, right 03/06/2017  . Preoperative cardiovascular  examination 11/19/2015  . Hypertension 09/03/2012  . Hyperlipidemia 09/03/2012  . Overweight (BMI 25.0-29.9) 09/03/2012  . Statin intolerance 09/03/2012  . 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 Arthritis   . Dyslipidemia   . GERD (gastroesophageal reflux disease)   . Headache    ocular migraines  . History of kidney stones   . Peripheral vascular disease ([place])    Carotid stenosis right   . PVC's (premature ventricular contractions)   . Systemic hypertension   . 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   Oxycodone Nausea Only   Statins Other (See Comments)   Depression      Medication List    TAKE these medications   acetaminophen 500 MG tablet Commonly known as:  TYLENOL Take 1,000 mg by mouth every 6 (six) hours as needed for moderate  pain or headache.   amLODipine 10 MG tablet Commonly known as:  NORVASC TAKE 1 TABLET BY MOUTH EVERY DAY   aspirin EC 81 MG tablet Take 81 mg by mouth at bedtime.   CELEBREX 200 MG capsule Generic drug:  celecoxib Take 200 mg by mouth daily.   cetirizine 10 MG tablet Commonly known as:  ZYRTEC Take 10 mg by mouth 2 (two) times daily.   Coenzyme Q10 200 MG capsule Take 200 mg by mouth daily.   Evolocumab with Infusor 420 MG/3.5ML Soct Commonly known as:  REPATHA PUSHTRONEX SYSTEM Inject 420 mg into the skin every 30 (thirty) days.   hydrochlorothiazide 25 MG  tablet Commonly known as:  HYDRODIURIL TAKE 1 TABLET BY MOUTH DAILY What changed:    how much to take  how to take this  when to take this   HYDROcodone-acetaminophen 5-325 MG tablet Commonly known as:  NORCO/VICODIN Take 1 tablet by mouth every 4 (four) hours as needed for moderate pain.   lisinopril 20 MG tablet Commonly known as:  PRINIVIL,ZESTRIL TAKE 1 TABLET BY MOUTH EVERY DAY What changed:    how much to take  how to take this  when to take this   pravastatin 40 MG tablet Commonly known as:  PRAVACHOL TAKE 1 TABLET BY MOUTH EVERY EVENING What changed:    how much to take  how to take this  when to take this   traMADol 50 MG tablet Commonly known as:  ULTRAM Take 1 tablet (50 mg total) by mouth every 6 (six) hours as needed for moderate pain.        Discharge Instructions:   [facility] Discharge Instructions Carotid Endarterectomy (CEA)  Please refer to the following instructions for your post-procedure care. Your surgeon or physician assistant will discuss any changes with you.  Activity  You are encouraged to walk as much as you can. You can slowly return to normal activities but must avoid strenuous activity and heavy lifting until your doctor tell you it's OK. Avoid activities such as vacuuming or swinging a golf club. You can drive after one week if you are comfortable and you are no longer taking prescription pain medications. It is normal to feel tired for serval weeks after your surgery. It is also normal to have difficulty with sleep habits, eating, and bowel movements after surgery. These will go away with time.  Bathing/Showering  You may shower after you come home. Do not soak in a bathtub, hot tub, or swim until the incision heals completely.  Incision Care  Shower every day. Clean your incision with mild soap and water. Pat the area dry with a clean towel. You do not need a bandage unless otherwise  instructed. Do not apply any ointments or creams to your incision. You may have skin glue on your incision. Do not peel it off. It will come off on its own in about one week. Your incision may feel thickened and raised for several weeks after your surgery. This is normal and the skin will soften over time. For Men Only: It's OK to shave around the incision but do not shave the incision itself for 2 weeks. It is common to have numbness under your chin that could last for several months.  Diet  Resume your normal diet. There are no special food restrictions following this procedure. A low fat/low cholesterol diet is recommended for all patients with vascular disease. In order to heal from your  surgery, it is CRITICAL to get adequate nutrition. Your body requires vitamins, minerals, and protein. Vegetables are the best source of vitamins and minerals. Vegetables also provide the perfect balance of protein. Processed food has little nutritional value, so try to avoid this.  Medications  Resume taking all of your medications unless your doctor or physician assistant tells you not to.  If your incision is causing pain, you may take over-the- counter pain relievers such as acetaminophen (Tylenol). If you were prescribed a stronger pain medication, please be aware these medications can cause nausea and constipation.  Prevent nausea by taking the medication with a snack or meal. Avoid constipation by drinking plenty of fluids and eating foods with a high amount of fiber, such as fruits, vegetables, and grains. Do not take Tylenol if you are taking prescription pain medications.  Follow Up  Our office will schedule a follow up appointment 2-3 weeks following discharge.  Please call us immediately for any of the following conditions  Increased pain, redness, drainage (pus) from your incision site. Fever of 101 degrees or higher. If you should develop stroke (slurred speech, difficulty swallowing, weakness on  one side of your body, loss of vision) you should call 911 and go to the nearest emergency room.  Reduce your risk of vascular disease:  Stop smoking. If you would like help call [facility] at 1-800-QUIT-NOW 630 649 0701) or [place] at [phone]. Manage your cholesterol Maintain a desired weight Control your diabetes Keep your blood pressure down  If you have any questions, please call the office at [phone].  Disposition: home  Patient's condition: is Good  Follow up: 1. Dr. Oneida Alar in 2 weeks.   Dagoberto Ligas, PA-C [facility] [phone]   --- For [facility] Registry use ---   Modified Rankin score at D/C (0-6): 0  IV medication needed for:  1. Hypertension: No 2. Hypotension: No  Post-op Complications: No  1. Post-op CVA or TIA: No  If yes: Event classification (right eye, left eye, right cortical, left cortical, verterobasilar, other):   If yes: Timing of event (intra-op, <6 hrs post-op, >=6 hrs post-op, unknown):   2. CN injury: No  If yes: CN  injuried   3. Myocardial infarction: No  If yes: Dx by (EKG or clinical, Troponin):   4.  CHF: No  5.  Dysrhythmia (new): No  6. Wound infection: No  7. Reperfusion symptoms: No  8. Return to OR: No  If yes: return to OR for (bleeding, neurologic, other CEA incision, other):   Discharge medications: Statin use:  Yes ASA use:  Yes   Beta blocker use:  No ACE-Inhibitor use:  Yes  ARB use:  No CCB use: Yes P2Y12 Antagonist use: No, [ ]  Plavix, [ ]  Plasugrel, [ ]  Ticlopinine, [ ]  Ticagrelor, [ ]  Other, [ ]  No for medical reason, [ ]  Non-compliant, [ ]  Not-indicated Anti-coagulant use:  No, [ ]  Warfarin, [ ]  Rivaroxaban, [ ]  Dabigatran,

## 2017-04-13 NOTE — Progress Notes (Signed)
Arterial line removed as per orders.  Site bled for several minutes after removal.  Right wrist area noted to become swollen and taught.  Pressured held for approximately 20 minutes until bleeding stopped.  Swelling down in wrist and area noted to be soft and supple.  Gauze secured with pressure dressing.  Pt educated on signs of bleeding and extremity restrictions.

## 2017-04-13 NOTE — Discharge Instructions (Signed)
   [facility] [place]  Discharge Instructions   Carotid Endarterectomy (CEA)  Please refer to the following instructions for your post-procedure care. Your surgeon or physician assistant will discuss any changes with you.  Activity  You are encouraged to walk as much as you can. You can slowly return to normal activities but must avoid strenuous activity and heavy lifting until your doctor tell you it's OK. Avoid activities such as vacuuming or swinging a golf club. You can drive after one week if you are comfortable and you are no longer taking prescription pain medications. It is normal to feel tired for serval weeks after your surgery. It is also normal to have difficulty with sleep habits, eating, and bowel movements after surgery. These will go away with time.  Bathing/Showering  You may shower after you come home. Do not soak in a bathtub, hot tub, or swim until the incision heals completely.  Incision Care  Shower every day. Clean your incision with mild soap and water. Pat the area dry with a clean towel. You do not need a bandage unless otherwise instructed. Do not apply any ointments or creams to your incision. You may have skin glue on your incision. Do not peel it off. It will come off on its own in about one week. Your incision may feel thickened and raised for several weeks after your surgery. This is normal and the skin will soften over time. For Men Only: It's OK to shave around the incision but do not shave the incision itself for 2 weeks. It is common to have numbness under your chin that could last for several months.  Diet  Resume your normal diet. There are no special food restrictions following this procedure. A low fat/low cholesterol diet is recommended for all patients with vascular disease. In order to heal from your surgery, it is CRITICAL to get adequate nutrition. Your body requires vitamins, minerals, and protein. Vegetables are the best  source of vitamins and minerals. Vegetables also provide the perfect balance of protein. Processed food has little nutritional value, so try to avoid this.        Medications  Resume taking all of your medications unless your doctor or physician assistant tells you not to. If your incision is causing pain, you may take over-the- counter pain relievers such as acetaminophen (Tylenol). If you were prescribed a stronger pain medication, please be aware these medications can cause nausea and constipation. Prevent nausea by taking the medication with a snack or meal. Avoid constipation by drinking plenty of fluids and eating foods with a high amount of fiber, such as fruits, vegetables, and grains. Do not take Tylenol if you are taking prescription pain medications.  Follow Up  Our office will schedule a follow up appointment 2-3 weeks following discharge.  Please call us immediately for any of the following conditions  Increased pain, redness, drainage (pus) from your incision site. Fever of 101 degrees or higher. If you should develop stroke (slurred speech, difficulty swallowing, weakness on one side of your body, loss of vision) you should call 911 and go to the nearest emergency room.  Reduce your risk of vascular disease:  Stop smoking. If you would like help call [facility] at 1-800-QUIT-NOW (1-800-784-8669) or [place] at [phone]. Manage your cholesterol Maintain a desired weight Control your diabetes Keep your blood pressure down  If you have any questions, please call the office at [phone].   

## 2017-04-13 NOTE — Anesthesia Postprocedure Evaluation (Signed)
Anesthesia Post Note  Patient: Talmage Coin Schwebke  Procedure(s) Performed: ENDARTERECTOMY CAROTID LEFT (Left Neck) PATCH ANGIOPLASTY LEFT CAROTID ARTERY (Left Neck)     Patient location during evaluation: PACU Anesthesia Type: General Level of consciousness: awake and alert Pain management: pain level controlled Vital Signs Assessment: post-procedure vital signs reviewed and stable Respiratory status: spontaneous breathing, nonlabored ventilation, respiratory function stable and patient connected to nasal cannula oxygen Cardiovascular status: blood pressure returned to baseline and stable Postop Assessment: no apparent nausea or vomiting Anesthetic complications: no    Last Vitals:  Vitals:   04/13/17 0222 04/13/17 0522  BP:  106/60  Pulse:  76  Resp: 15 (!) 21  Temp:  36.8 C  SpO2:  99%    Last Pain:  Vitals:   04/13/17 0522  TempSrc: Oral  PainSc:                  Delara Shepheard

## 2017-04-13 NOTE — Care Management Note (Addendum)
Case Management Note  Patient Details  Name: KURTIS ANASTASIA MRN: [id number] Date of Birth: 07/04/1940  Subjective/Objective:     Admitted S/p CEA left side   Action/Plan: PCP is Maurice Small.  Prior to admission patient lived at home with spouse.  Patient with discharge orders home today.  No discharge needs noted at this time.  Expected Discharge Date:  04/13/17               Expected Discharge Plan:  Home/Self Care  In-House Referral:  NA  Discharge planning Services  CM Consult  Status of Service:  Completed, signed off  Kristen Cardinal, RN  Nurse case manager [place] [phone] 04/13/2017, 9:57 AM

## 2017-04-13 NOTE — Telephone Encounter (Signed)
Sched appt 05/04/17 at 4:00. Spoke to pt to inform them of appt.

## 2017-04-13 NOTE — Telephone Encounter (Signed)
-----   Message from Mena Goes, RN sent at 04/13/2017 11:59 AM EST ----- Regarding: 2 -3 weeks post CEA   ----- Message ----- From: Iline Oven Sent: 04/13/2017   7:17 AM To: Vvs Charge Pool  Can you schedule an appt for this pt in 2 weeks with Dr. Oneida Alar.  PO L CEA. Thanks, [facility]

## 2017-04-13 NOTE — Progress Notes (Signed)
[facility]  Subjective  - feels good   Objective 106/60 76 98.2 F (36.8 C) (Oral) (!) 21 99%  Intake/Output Summary (Last 24 hours) at 04/13/2017 0954 Last data filed at 04/12/2017 2300 Gross per 24 hour  Intake 3720 ml  Output 1225 ml  Net 2495 ml   Left neck no hematoma Neuro UE LE 5/5  Motor, tongue midline  Assessment/Planning: S/p CEA left side doing well D/c home  Ruta Hinds 04/13/2017 9:54 AM --  Laboratory Lab Results: Recent Labs    04/11/17 1553 04/13/17 0232  WBC 9.7 14.2*  HGB 14.7 11.1*  HCT 43.2 33.5*  PLT 235 177   BMET Recent Labs    04/12/17 0605 04/13/17 0232  NA 137 137  K 3.6 3.8  CL 103 107  CO2 23 22  GLUCOSE 98 146*  BUN 27* 23*  CREATININE 1.41* 1.32*  CALCIUM 9.4 8.7*    COAG Lab Results  Component Value Date   INR 1.02 04/11/2017   INR 0.98 03/06/2017   INR 2.16 (H) 11/25/2010   No results found for: PTT

## 2017-04-18 DIAGNOSIS — Z9889 Other specified postprocedural states: Secondary | ICD-10-CM | POA: Diagnosis not present

## 2017-04-18 DIAGNOSIS — Z23 Encounter for immunization: Secondary | ICD-10-CM | POA: Diagnosis not present

## 2017-04-18 DIAGNOSIS — Z09 Encounter for follow-up examination after completed treatment for conditions other than malignant neoplasm: Secondary | ICD-10-CM | POA: Diagnosis not present

## 2017-04-18 DIAGNOSIS — I6522 Occlusion and stenosis of left carotid artery: Secondary | ICD-10-CM | POA: Diagnosis not present

## 2017-04-20 ENCOUNTER — Ambulatory Visit (INDEPENDENT_AMBULATORY_CARE_PROVIDER_SITE_OTHER): Payer: Medicare Other | Admitting: Family

## 2017-04-20 ENCOUNTER — Encounter: Payer: Self-pay | Admitting: Family

## 2017-04-20 VITALS — BP 127/79 | HR 76 | Temp 97.6°F | Resp 20 | Ht 69.0 in | Wt 190.2 lb

## 2017-04-20 DIAGNOSIS — Z9889 Other specified postprocedural states: Secondary | ICD-10-CM

## 2017-04-20 DIAGNOSIS — I6523 Occlusion and stenosis of bilateral carotid arteries: Secondary | ICD-10-CM

## 2017-04-20 NOTE — Patient Instructions (Signed)

## 2017-04-20 NOTE — Progress Notes (Signed)
Postoperative Visit   History of Present Illness  Patrick Vargas is a 77 y.o. male who is s/p left carotid endarterectomy with Dacron patch on 04-12-17 by Dr. 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  He is also s/p right CEA on 03-06-17 by Dr. Oneida Alar for >80% stenosis.   He has no history of stroke or TIA, and he states he no longer sees spots in his visual fields since the CEA's.   He returns today at the suggestion of his PCP who he saw recently, and c/o feeling tired, fever and chills, mild difficulty using his tongue when eating, denies trouble breathing or swallowing. He had some coughing spells that he has had before and after the CEA which he attributes to allergies and post nasal drip.  Pt denies hemiparesis, denies visual changes.  He denies pain at the left side of his neck, he does report mild swelling at the left side neck incision that does not bother him.   He started Repatha on 04-15-17.   Pt has an appointment scheduled with Dr. Oneida Alar on 05-04-17.      The patient's neck incision is healing well.    For VQI Use Only  PRE-ADM LIVING: Home  AMB STATUS: Ambulatory   Past Medical History:  Diagnosis Date  . Arthritis   . Carotid artery occlusion   . Dyslipidemia   . GERD (gastroesophageal reflux disease)   . Headache    ocular migraines  . History of kidney stones   . Peripheral vascular disease ([place])    Carotid stenosis right   . PVC's (premature ventricular contractions)   . Systemic hypertension   . 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 BUNIONECTOMY  1994   Left foot  . CAROTID ENDARTERECTOMY    . CARPAL TUNNEL RELEASE  1999   Left hand  . ENDARTERECTOMY Right 03/06/2017   Procedure: ENDARTERECTOMY CAROTID RIGHT;  Surgeon: Elam Dutch, MD;  Location: [facility];  Service: Vascular;  Laterality: Right;  . ENDARTERECTOMY Left 04/12/2017   Procedure: ENDARTERECTOMY CAROTID LEFT;   Surgeon: Elam Dutch, MD;  Location: [facility];  Service: Vascular;  Laterality: Left;  . PATCH ANGIOPLASTY Left 04/12/2017   Procedure: PATCH ANGIOPLASTY LEFT CAROTID ARTERY;  Surgeon: Elam Dutch, MD;  Location: [facility];  Service: Vascular;  Laterality: Left;  . TENDON REPAIR Right    right foot  . TOTAL KNEE ARTHROPLASTY  12/04/2010   Right    Social History   Socioeconomic History  . Marital status: Married    Spouse name: Not on file  . Number of children: Not on file  . Years of education: Not on file  . Highest education level: Not on file  Social Needs  . Financial resource strain: Not on file  . Food insecurity - worry: Not on file  . Food insecurity - inability: Not on file  . Transportation needs - medical: Not on file  . Transportation needs - non-medical: Not on file  Occupational History  . Not on file  Tobacco Use  . Smoking status: Never Smoker  . Smokeless tobacco: Never Used  Substance and Sexual Activity  . Alcohol use: Yes    Alcohol/week: 2.5 oz    Types: 5 Standard drinks or equivalent per week  . Drug use: No  . Sexual activity: Not on file  Other Topics Concern  . Not on  file  Social History Narrative  . Not on file    Allergies  Allergen Reactions  . Amoxicillin Nausea Only and Other (See Comments)    Has patient had a PCN reaction causing immediate rash, facial/tongue/throat swelling, SOB or lightheadedness with hypotension: No Has patient had a PCN reaction causing severe rash involving mucus membranes or skin necrosis: No Has patient had a PCN reaction that required hospitalization: No Has patient had a PCN reaction occurring within the last 10 years: No If all of the above answers are "NO", then may proceed with Cephalosporin use.   Marland Kitchen Oxycodone Nausea Only  . Statins Other (See Comments)    Depression     Current Outpatient Medications on File Prior to Visit  Medication Sig Dispense Refill  . acetaminophen (TYLENOL) 500 MG  tablet Take 1,000 mg by mouth every 6 (six) hours as needed for moderate pain or headache.    Marland Kitchen amLODipine (NORVASC) 10 MG tablet TAKE 1 TABLET BY MOUTH EVERY DAY 90 tablet 4  . aspirin EC 81 MG tablet Take 81 mg by mouth at bedtime.    . CELEBREX 200 MG capsule Take 200 mg by mouth daily.     . cetirizine (ZYRTEC) 10 MG tablet Take 10 mg by mouth 2 (two) times daily.     . Coenzyme Q10 200 MG capsule Take 200 mg by mouth daily.    . Evolocumab with Infusor ([place]) 420 MG/3.5ML SOCT Inject 420 mg into the skin every 30 (thirty) days. 1 Cartridge 12  . hydrochlorothiazide (HYDRODIURIL) 25 MG tablet TAKE 1 TABLET BY MOUTH DAILY (Patient taking differently: TAKE 1 TABLET (25 MG) BY MOUTH DAILY IN THE MORNING.) 90 tablet 0  . lisinopril (PRINIVIL,ZESTRIL) 20 MG tablet TAKE 1 TABLET BY MOUTH EVERY DAY (Patient taking differently: TAKE 1 TABLET (20 MG) BY MOUTH DAILY IN THE MORNING.) 90 tablet 3  . pravastatin (PRAVACHOL) 40 MG tablet TAKE 1 TABLET BY MOUTH EVERY EVENING (Patient taking differently: TAKE 1 TABLET (40 MG) BY MOUTH EVERY EVENING) 90 tablet 4  . HYDROcodone-acetaminophen (NORCO/VICODIN) 5-325 MG tablet Take 1 tablet by mouth every 4 (four) hours as needed for moderate pain. (Patient not taking: Reported on 04/07/2017) 6 tablet 0  . traMADol (ULTRAM) 50 MG tablet Take 1 tablet (50 mg total) by mouth every 6 (six) hours as needed for moderate pain. (Patient not taking: Reported on 04/20/2017) 8 tablet 0   No current facility-administered medications on file prior to visit.       Social History   Socioeconomic History  . Marital status: Married    Spouse name: Not on file  . Number of children: Not on file  . Years of education: Not on file  . Highest education level: Not on file  Social Needs  . Financial resource strain: Not on file  . Food insecurity - worry: Not on file  . Food insecurity - inability: Not on file  . Transportation needs - medical: Not on file    . Transportation needs - non-medical: Not on file  Occupational History  . Not on file  Tobacco Use  . Smoking status: Never Smoker  . Smokeless tobacco: Never Used  Substance and Sexual Activity  . Alcohol use: Yes    Alcohol/week: 2.5 oz    Types: 5 Standard drinks or equivalent per week  . Drug use: No  . Sexual activity: Not on file  Other Topics Concern  . Not on file  Social History  Narrative  . Not on file    Current Outpatient Medications on File Prior to Visit  Medication Sig Dispense Refill  . acetaminophen (TYLENOL) 500 MG tablet Take 1,000 mg by mouth every 6 (six) hours as needed for moderate pain or headache.    Marland Kitchen amLODipine (NORVASC) 10 MG tablet TAKE 1 TABLET BY MOUTH EVERY DAY 90 tablet 4  . aspirin EC 81 MG tablet Take 81 mg by mouth at bedtime.    . CELEBREX 200 MG capsule Take 200 mg by mouth daily.     . cetirizine (ZYRTEC) 10 MG tablet Take 10 mg by mouth 2 (two) times daily.     . Coenzyme Q10 200 MG capsule Take 200 mg by mouth daily.    . Evolocumab with Infusor ([place]) 420 MG/3.5ML SOCT Inject 420 mg into the skin every 30 (thirty) days. 1 Cartridge 12  . hydrochlorothiazide (HYDRODIURIL) 25 MG tablet TAKE 1 TABLET BY MOUTH DAILY (Patient taking differently: TAKE 1 TABLET (25 MG) BY MOUTH DAILY IN THE MORNING.) 90 tablet 0  . lisinopril (PRINIVIL,ZESTRIL) 20 MG tablet TAKE 1 TABLET BY MOUTH EVERY DAY (Patient taking differently: TAKE 1 TABLET (20 MG) BY MOUTH DAILY IN THE MORNING.) 90 tablet 3  . pravastatin (PRAVACHOL) 40 MG tablet TAKE 1 TABLET BY MOUTH EVERY EVENING (Patient taking differently: TAKE 1 TABLET (40 MG) BY MOUTH EVERY EVENING) 90 tablet 4  . HYDROcodone-acetaminophen (NORCO/VICODIN) 5-325 MG tablet Take 1 tablet by mouth every 4 (four) hours as needed for moderate pain. (Patient not taking: Reported on 04/07/2017) 6 tablet 0  . traMADol (ULTRAM) 50 MG tablet Take 1 tablet (50 mg total) by mouth every 6 (six) hours as needed  for moderate pain. (Patient not taking: Reported on 04/20/2017) 8 tablet 0   No current facility-administered medications on file prior to visit.     Physical Examination  Vitals:   04/20/17 0959 04/20/17 1000  BP: 129/81 127/79  Pulse: 76   Resp: 20   Temp: 97.6 F (36.4 C)   TempSrc: Oral   SpO2: 97%   Weight: 190 lb 3.2 oz (86.3 kg)   Height: 5\' 9"  (1.753 m)    Body mass index is 28.09 kg/m.    Left CEA site   Left side of neck: Incision is healing, edges well proxinated, moderate swelling, no erythema, surgical glue peeling off. Neuro: CN 2-12 are intact, Motor strength is 5/5 bilaterally, sensation is intact.  Medical Decision Making  KHANI PAINO is a 78 y.o. male who presents s/p left carotid endarterectomy with Dacron patch on 04-12-17 by Dr. 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  He is also s/p right CEA on 03-06-17 by Dr. Oneida Alar for >80% stenosis.  I discussed with Dr. Oneida Alar pt HPI and physical exam results. Pt has an appointment scheduled with Dr. Oneida Alar on 05-04-17, follow up at that time.  I advised pt to notify us if he feels worse in any way.   The patient's neck incision is healing with no stroke symptoms.   Clemon Chambers, RN, MSN, FNP-C [facility] Office: [phone]  04/20/2017, 10:28 AM  Clinic MD: Oneida Alar

## 2017-04-24 DIAGNOSIS — L814 Other melanin hyperpigmentation: Secondary | ICD-10-CM | POA: Diagnosis not present

## 2017-04-24 DIAGNOSIS — L578 Other skin changes due to chronic exposure to nonionizing radiation: Secondary | ICD-10-CM | POA: Diagnosis not present

## 2017-04-24 DIAGNOSIS — Z85828 Personal history of other malignant neoplasm of skin: Secondary | ICD-10-CM | POA: Diagnosis not present

## 2017-04-24 DIAGNOSIS — L738 Other specified follicular disorders: Secondary | ICD-10-CM | POA: Diagnosis not present

## 2017-04-24 DIAGNOSIS — L821 Other seborrheic keratosis: Secondary | ICD-10-CM | POA: Diagnosis not present

## 2017-04-24 DIAGNOSIS — Z1283 Encounter for screening for malignant neoplasm of skin: Secondary | ICD-10-CM | POA: Diagnosis not present

## 2017-04-24 DIAGNOSIS — L570 Actinic keratosis: Secondary | ICD-10-CM | POA: Diagnosis not present

## 2017-04-24 DIAGNOSIS — D229 Melanocytic nevi, unspecified: Secondary | ICD-10-CM | POA: Diagnosis not present

## 2017-04-24 DIAGNOSIS — L812 Freckles: Secondary | ICD-10-CM | POA: Diagnosis not present

## 2017-04-24 DIAGNOSIS — Z7189 Other specified counseling: Secondary | ICD-10-CM | POA: Diagnosis not present

## 2017-04-24 DIAGNOSIS — Z872 Personal history of diseases of the skin and subcutaneous tissue: Secondary | ICD-10-CM | POA: Diagnosis not present

## 2017-05-04 ENCOUNTER — Encounter: Payer: Self-pay | Admitting: Vascular Surgery

## 2017-05-04 ENCOUNTER — Ambulatory Visit (INDEPENDENT_AMBULATORY_CARE_PROVIDER_SITE_OTHER): Payer: Medicare Other | Admitting: Vascular Surgery

## 2017-05-04 VITALS — BP 122/81 | HR 71 | Resp 20 | Ht 69.0 in | Wt 193.8 lb

## 2017-05-04 DIAGNOSIS — I6523 Occlusion and stenosis of bilateral carotid arteries: Secondary | ICD-10-CM

## 2017-05-04 NOTE — Progress Notes (Signed)
Patrick Vargas is a 77 year old male who returns for follow-up today after recent carotid endarterectomy.  He had staged right followed by left carotid endarterectomies.  His last operation was February 27.  After that operation he has had some weakness in tone of voice.  He also has some occasional weakness in his tongue muscle.  He has had some mild problems with swallowing.  He did not have any of this present after his right side carotid endarterectomy.  Denies any incisional drainage.  He has no symptoms of TIA amaurosis or stroke.  He is on statin and aspirin.  Current Outpatient Medications on File Prior to Visit  Medication Sig Dispense Refill  . acetaminophen (TYLENOL) 500 MG tablet Take 1,000 mg by mouth every 6 (six) hours as needed for moderate pain or headache.    Marland Kitchen amLODipine (NORVASC) 10 MG tablet TAKE 1 TABLET BY MOUTH EVERY DAY 90 tablet 4  . aspirin EC 81 MG tablet Take 81 mg by mouth at bedtime.    . CELEBREX 200 MG capsule Take 200 mg by mouth daily.     . cetirizine (ZYRTEC) 10 MG tablet Take 10 mg by mouth 2 (two) times daily.     . Coenzyme Q10 200 MG capsule Take 200 mg by mouth daily.    . Evolocumab with Infusor ([place]) 420 MG/3.5ML SOCT Inject 420 mg into the skin every 30 (thirty) days. 1 Cartridge 12  . hydrochlorothiazide (HYDRODIURIL) 25 MG tablet TAKE 1 TABLET BY MOUTH DAILY (Patrick Vargas taking differently: TAKE 1 TABLET (25 MG) BY MOUTH DAILY IN THE MORNING.) 90 tablet 0  . lisinopril (PRINIVIL,ZESTRIL) 20 MG tablet TAKE 1 TABLET BY MOUTH EVERY DAY (Patrick Vargas taking differently: TAKE 1 TABLET (20 MG) BY MOUTH DAILY IN THE MORNING.) 90 tablet 3  . pravastatin (PRAVACHOL) 40 MG tablet TAKE 1 TABLET BY MOUTH EVERY EVENING (Patrick Vargas taking differently: TAKE 1 TABLET (40 MG) BY MOUTH EVERY EVENING) 90 tablet 4  . HYDROcodone-acetaminophen (NORCO/VICODIN) 5-325 MG tablet Take 1 tablet by mouth every 4 (four) hours as needed for moderate pain. (Patrick Vargas not taking:  Reported on 04/07/2017) 6 tablet 0  . traMADol (ULTRAM) 50 MG tablet Take 1 tablet (50 mg total) by mouth every 6 (six) hours as needed for moderate pain. (Patrick Vargas not taking: Reported on 04/20/2017) 8 tablet 0   No current facility-administered medications on file prior to visit.      Physical exam:  Vitals:   05/04/17 1557 05/04/17 1559  BP: 127/75 122/81  Pulse: 71   Resp: 20   SpO2: 97%   Weight: 193 lb 12.8 oz (87.9 kg)   Height: 5\' 9"  (1.753 m)     Neck: Well-healed neck incisions bilaterally.  No carotid bruit  Neuro: Symmetric upper extremity and lower extremity motor strength 5/5 tongue despite subjective weakness is midline he does have some weakness of his voice though not complete hoarseness  Assessment: Doing well status post bilateral carotid endarterectomy.  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  He will return to see me in 6 weeks if this has not completely resolved and we would consider further ear nose and throat evaluation.  Otherwise he will return and see our nurse practitioner in 6 months with a bilateral carotid duplex exam.  He will continue his aspirin and statin.  Ruta Hinds, MD [facility] Office: [phone] Pager: [phone]

## 2017-05-05 ENCOUNTER — Other Ambulatory Visit: Payer: Self-pay

## 2017-05-05 DIAGNOSIS — I6523 Occlusion and stenosis of bilateral carotid arteries: Secondary | ICD-10-CM

## 2017-05-05 DIAGNOSIS — Z9889 Other specified postprocedural states: Secondary | ICD-10-CM

## 2017-05-05 DIAGNOSIS — I6521 Occlusion and stenosis of right carotid artery: Secondary | ICD-10-CM

## 2017-05-09 ENCOUNTER — Other Ambulatory Visit: Payer: Self-pay | Admitting: Cardiovascular Disease

## 2017-05-09 NOTE — Telephone Encounter (Signed)
REFILL 

## 2017-05-16 ENCOUNTER — Telehealth: Payer: Self-pay | Admitting: Cardiovascular Disease

## 2017-05-16 NOTE — Telephone Encounter (Signed)
LMOM for patient - will try to reach out to him again tomorrow

## 2017-05-16 NOTE — Telephone Encounter (Signed)
°  Pt c/o medication issue:  1. Name of Medication:   Evolocumab with Infusor ([place]) 420 MG/3.5ML SOCT Inject 420 mg into the skin every 30 (thirty) days.   2. How are you currently taking this medication (dosage and times per day)?  Inject 420 mg into the skin every 30 (thirty) days. 3. Are you having a reaction (difficulty breathing--STAT)? no  4. What is your medication issue? Pt verbalized that he is calling for RN  The injections for Repatha didn't work and he is waiting on a new order

## 2017-05-17 NOTE — Telephone Encounter (Signed)
Spoke with patient - he went to use second dose of Repatha Pushtronix, however when he hit the button, the light blinked red and nothing happened.   Is waiting on replacement device from manufacturer.  If he continues to have problems will contact us.  Otherwise, we will plan to repeat labs after 3rd dose in early May.

## 2017-06-01 ENCOUNTER — Other Ambulatory Visit: Payer: Self-pay | Admitting: Pharmacist Clinician (PhC)/ Clinical Pharmacy Specialist

## 2017-06-01 ENCOUNTER — Telehealth: Payer: Self-pay | Admitting: Pharmacist Clinician (PhC)/ Clinical Pharmacy Specialist

## 2017-06-01 DIAGNOSIS — E785 Hyperlipidemia, unspecified: Secondary | ICD-10-CM

## 2017-06-01 NOTE — Telephone Encounter (Signed)
Mailed lab order to patient for lipid draw after 3rd dose of Repatha Pushtronix

## 2017-06-27 DIAGNOSIS — E785 Hyperlipidemia, unspecified: Secondary | ICD-10-CM | POA: Diagnosis not present

## 2017-06-27 LAB — HEPATIC FUNCTION PANEL
ALBUMIN: 4.2 g/dL (ref 3.5–4.8)
ALT: 21 IU/L (ref 0–44)
AST: 26 IU/L (ref 0–40)
Alkaline Phosphatase: 58 IU/L (ref 39–117)
BILIRUBIN TOTAL: 0.3 mg/dL (ref 0.0–1.2)
Bilirubin, Direct: 0.07 mg/dL (ref 0.00–0.40)
Total Protein: 6.4 g/dL (ref 6.0–8.5)

## 2017-06-27 LAB — LIPID PANEL
CHOL/HDL RATIO: 2.8 ratio (ref 0.0–5.0)
Cholesterol, Total: 111 mg/dL (ref 100–199)
HDL: 40 mg/dL (ref >39)
LDL CALC: 52 mg/dL (ref 0–99)
TRIGLYCERIDES: 97 mg/dL (ref 0–149)
VLDL CHOLESTEROL CAL: 19 mg/dL (ref 5–40)

## 2017-07-05 ENCOUNTER — Telehealth: Payer: Self-pay | Admitting: Cardiovascular Disease

## 2017-07-05 NOTE — Telephone Encounter (Signed)
New Message: ° ° ° ° ° °Pt is calling to get some lab results °

## 2017-07-05 NOTE — Telephone Encounter (Signed)
Lab results given to pt. Pt stated [place] the beach after he had his labs done so he has not been there to get his mail and wanted to call to ask about the results. Pt verbalized thanks.

## 2017-08-08 DIAGNOSIS — Z09 Encounter for follow-up examination after completed treatment for conditions other than malignant neoplasm: Secondary | ICD-10-CM | POA: Diagnosis not present

## 2017-08-08 DIAGNOSIS — M1712 Unilateral primary osteoarthritis, left knee: Secondary | ICD-10-CM | POA: Diagnosis not present

## 2017-08-08 DIAGNOSIS — Z96651 Presence of right artificial knee joint: Secondary | ICD-10-CM | POA: Diagnosis not present

## 2017-11-09 ENCOUNTER — Other Ambulatory Visit: Payer: Self-pay

## 2017-11-09 ENCOUNTER — Ambulatory Visit (HOSPITAL_COMMUNITY)
Admission: RE | Admit: 2017-11-09 | Discharge: 2017-11-09 | Disposition: A | Discharge status: Home / Self Care | Payer: Medicare Other | Source: Ambulatory Visit | Attending: Vascular Surgery | Admitting: Vascular Surgery

## 2017-11-09 ENCOUNTER — Encounter: Payer: Self-pay | Admitting: Family

## 2017-11-09 ENCOUNTER — Ambulatory Visit (INDEPENDENT_AMBULATORY_CARE_PROVIDER_SITE_OTHER): Payer: Medicare Other | Admitting: Family

## 2017-11-09 VITALS — BP 137/85 | HR 57 | Temp 97.0°F | Resp 18 | Ht 70.0 in | Wt 189.0 lb

## 2017-11-09 DIAGNOSIS — I6521 Occlusion and stenosis of right carotid artery: Secondary | ICD-10-CM

## 2017-11-09 DIAGNOSIS — Z9889 Other specified postprocedural states: Secondary | ICD-10-CM | POA: Diagnosis not present

## 2017-11-09 DIAGNOSIS — I6523 Occlusion and stenosis of bilateral carotid arteries: Secondary | ICD-10-CM

## 2017-11-09 NOTE — Progress Notes (Signed)
Chief Complaint: Follow up Extracranial Carotid Artery Stenosis   History of Present Illness  Patrick Vargas is a 77 y.o. male who is s/p left carotid endarterectomy with Dacron patch on 04-12-17 by Dr. Oneida Alar for >80%leftinternal carotid stenosis.  He is also s/p right CEA on 03-06-17 by Dr. Oneida Alar for >80% stenosis.   He has no history of stroke or TIA, and he states he no longer sees spots in his visual fields since the CEA's.   He returned on 04-20-17  at the suggestion of his PCP who he saw recently, and c/o feeling tired, fever and chills, mild difficulty using his tongue when eating, denies trouble breathing or swallowing. He had some coughing spells that he has had before and after the CEA which he attributes to allergies and post nasal drip.  Pt denies hemiparesis, denies visual changes.  He denied pain at the left side of his neck, he did have mild swelling at the left side neck incision that did not bother him.   Dr. Oneida Alar last evaluated pt on 05-04-17. At that time py had symmetric upper extremity and lower extremity motor strength 5/5, despite subjective weakness tongue is midline he had some weakness of his voice though not complete hoarseness.  Doing well status post bilateral carotid endarterectomy.  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  He was to return to see Dr. Oneida Alar in 6 weeks if this had not completely resolved and we would consider further ear nose and throat evaluation.  Otherwise he was to return and see our nurse practitioner in 6 months with a bilateral carotid duplex exam.  He was to continue his aspirin and statin.  The neuropraxia symptoms and speech difficulties have resolved, no weakness in his speech.   He started Repatha on 04-15-17, states his cholesterol is under good control.   He denies claudication type symptoms in his legs with walking.   He works in his yards several times/week.   Diabetic: no Tobacco use:  non-smoker  Pt meds include: Statin : pravastatin, lethargy reaction to another statin   ASA: yes Other anticoagulants/antiplatelets: no   Past Medical History:  Diagnosis Date  . Arthritis   . Carotid artery occlusion   . Dyslipidemia   . GERD (gastroesophageal reflux disease)   . Headache    ocular migraines  . History of kidney stones   . Peripheral vascular disease ([place])    Carotid stenosis right   . PVC's (premature ventricular contractions)   . Systemic hypertension   . 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 Smoking status: Never Smoker  . Smokeless tobacco: Never Used  Substance Use Topics  . Alcohol use: Yes    Alcohol/week: 5.0 standard drinks    Types: 5 Standard drinks or equivalent per week  . Drug use: No    Family History Family History  Problem Relation Age of Onset  . Peripheral Artery Disease Mother   . Stroke Father     Surgical History Past Surgical History:  Procedure Laterality Date  . BUNIONECTOMY  1994   Left foot  . CAROTID ENDARTERECTOMY    . CARPAL TUNNEL RELEASE  1999   Left hand  . ENDARTERECTOMY Right 03/06/2017   Procedure: ENDARTERECTOMY CAROTID RIGHT;  Surgeon: Elam Dutch, MD;  Location: [facility];  Service: Vascular;  Laterality: Right;  . ENDARTERECTOMY Left 04/12/2017  Procedure: ENDARTERECTOMY CAROTID LEFT;  Surgeon: Elam Dutch, MD;  Location: [facility];  Service: Vascular;  Laterality: Left;  . PATCH ANGIOPLASTY Left 04/12/2017   Procedure: PATCH ANGIOPLASTY LEFT CAROTID ARTERY;  Surgeon: Elam Dutch, MD;  Location: [facility] OR;  Service: Vascular;  Laterality: Left;  . TENDON REPAIR Right    right foot  . TOTAL KNEE ARTHROPLASTY  12/04/2010   Right    Allergies  Allergen Reactions  . Amoxicillin Nausea Only and Other (See Comments)    Has patient had a PCN reaction causing immediate rash, facial/tongue/throat swelling, SOB or lightheadedness with  hypotension: No Has patient had a PCN reaction causing severe rash involving mucus membranes or skin necrosis: No Has patient had a PCN reaction that required hospitalization: No Has patient had a PCN reaction occurring within the last 10 years: No If all of the above answers are "NO", then may proceed with Cephalosporin use.   Marland Kitchen Oxycodone Nausea Only  . Statins Other (See Comments)    Depression     Current Outpatient Medications  Medication Sig Dispense Refill  . acetaminophen (TYLENOL) 500 MG tablet Take 1,000 mg by mouth every 6 (six) hours as needed for moderate pain or headache.    Marland Kitchen amLODipine (NORVASC) 10 MG tablet TAKE 1 TABLET BY MOUTH EVERY DAY 90 tablet 4  . aspirin EC 81 MG tablet Take 81 mg by mouth at bedtime.    . CELEBREX 200 MG capsule Take 200 mg by mouth daily.     . cetirizine (ZYRTEC) 10 MG tablet Take 10 mg by mouth 2 (two) times daily.     . Coenzyme Q10 200 MG capsule Take 200 mg by mouth daily.    . Evolocumab with Infusor ([place]) 420 MG/3.5ML SOCT Inject 420 mg into the skin every 30 (thirty) days. 1 Cartridge 12  . hydrochlorothiazide (HYDRODIURIL) 25 MG tablet TAKE 1 TABLET BY MOUTH DAILY 90 tablet 1  . HYDROcodone-acetaminophen (NORCO/VICODIN) 5-325 MG tablet Take 1 tablet by mouth every 4 (four) hours as needed for moderate pain. (Patient not taking: Reported on 04/07/2017) 6 tablet 0  . lisinopril (PRINIVIL,ZESTRIL) 20 MG tablet TAKE 1 TABLET BY MOUTH EVERY DAY (Patient taking differently: TAKE 1 TABLET (20 MG) BY MOUTH DAILY IN THE MORNING.) 90 tablet 3  . pravastatin (PRAVACHOL) 40 MG tablet TAKE 1 TABLET BY MOUTH EVERY EVENING (Patient taking differently: TAKE 1 TABLET (40 MG) BY MOUTH EVERY EVENING) 90 tablet 4  . traMADol (ULTRAM) 50 MG tablet Take 1 tablet (50 mg total) by mouth every 6 (six) hours as needed for moderate pain. (Patient not taking: Reported on 04/20/2017) 8 tablet 0   No current facility-administered medications for  this visit.     Review of Systems : See HPI for pertinent positives and negatives.  Physical Examination  Vitals:   11/09/17 1345 11/09/17 1347  BP: 134/83 137/85  Pulse: (!) 57 (!) 57  Resp: 18   Temp: (!) 97 F (36.1 C)   TempSrc: Oral   SpO2: 97%   Weight: 189 lb (85.7 kg)   Height: 5\' 10"  (1.778 m)    Body mass index is 27.12 kg/m.  General: WDWN male in NAD GAIT: normal Eyes: PERRLA HENT: No gross abnormalities.  Pulmonary:  Respirations are non-labored, good air movement in all fields, CTAB, no rales, rhonchi, or wheezing. Cardiac: regular rhythm, no detected murmur.  VASCULAR EXAM Carotid Bruits Right Left   Negative Negative     Abdominal  aortic pulse is not palpable. Radial pulses are 2+ palpable and equal.                                                                                                                            LE Pulses Right Left       POPLITEAL  not palpable   not palpable       POSTERIOR TIBIAL  1+ palpable   1+ palpable        DORSALIS PEDIS      ANTERIOR TIBIAL 1+ palpable  2+ palpable     Gastrointestinal: soft, nontender, BS WNL, no r/g, no palpable masses. Musculoskeletal: no muscle atrophy/wasting. M/S 5/5 throughout, extremities without ischemic changes. Skin: No rashes, no ulcers, no cellulitis.   Neurologic:  A&O X 3; appropriate affect, sensation is normal; speech is normal, CN 2-12 intact, pain and light touch intact in extremities, motor exam as listed above. Psychiatric: Normal thought content, mood appropriate to clinical situation.    Assessment: Patrick Vargas is a 77 y.o. male who is s/p left carotid endarterectomy with Dacron patch on 04-12-17 by Dr. Oneida Alar for >80%leftinternal carotid stenosis.  He is also s/p right CEA on 03-06-17 by Dr. Oneida Alar for >80% stenosis.   He has no history of stroke or TIA, and he states he no longer sees spots in his visual fields since the CEA's.  The left neuropraxia and  weakness in his voice has resolved.    DATA Carotid Duplex (11-09-17); Bilateral ICA: (bilateral CEA sites) with 1-39% stenosis. Bilateral vertebral artery flow is antegrade.  Bilateral subclavian artery waveforms are normal.  This is the first postoperative exam since the left CEA on 04-12-17.   Plan: Follow-up in 6 months with Carotid Duplex scan.   I discussed in depth with the patient the nature of atherosclerosis, and emphasized the importance of maximal medical management including strict control of blood pressure, blood glucose, and lipid levels, btaining regular exercise, and continued cessation of smoking.  The patient is aware that without maximal medical management the underlying atherosclerotic disease process will progress, limiting the benefit of any interventions. The patient was given information about stroke prevention and what symptoms should prompt the patient to seek immediate medical care. Thank you for allowing [place] to participate in this patient's care.  Clemon Chambers, RN, MSN, FNP-C [facility] Office: [phone]  Clinic Physician: Oneida Alar  11/09/17 2:20 PM

## 2017-11-09 NOTE — Patient Instructions (Signed)

## 2017-11-27 ENCOUNTER — Other Ambulatory Visit: Payer: Self-pay | Admitting: Cardiovascular Disease

## 2017-11-29 DIAGNOSIS — E785 Hyperlipidemia, unspecified: Secondary | ICD-10-CM | POA: Diagnosis not present

## 2017-11-29 DIAGNOSIS — K219 Gastro-esophageal reflux disease without esophagitis: Secondary | ICD-10-CM | POA: Diagnosis not present

## 2017-11-29 DIAGNOSIS — I10 Essential (primary) hypertension: Secondary | ICD-10-CM | POA: Diagnosis not present

## 2017-11-29 DIAGNOSIS — M791 Myalgia, unspecified site: Secondary | ICD-10-CM | POA: Diagnosis not present

## 2017-11-29 DIAGNOSIS — Z0000 Encounter for general adult medical examination without abnormal findings: Secondary | ICD-10-CM | POA: Diagnosis not present

## 2017-11-29 DIAGNOSIS — Z23 Encounter for immunization: Secondary | ICD-10-CM | POA: Diagnosis not present

## 2017-12-10 ENCOUNTER — Other Ambulatory Visit: Payer: Self-pay | Admitting: Pharmacist Clinician (PhC)/ Clinical Pharmacy Specialist

## 2017-12-10 ENCOUNTER — Telehealth: Payer: Self-pay | Admitting: Pharmacist Clinician (PhC)/ Clinical Pharmacy Specialist

## 2017-12-10 DIAGNOSIS — E785 Hyperlipidemia, unspecified: Secondary | ICD-10-CM

## 2017-12-10 NOTE — Telephone Encounter (Signed)
Labs ordered for Repatha renewal

## 2017-12-22 DIAGNOSIS — E785 Hyperlipidemia, unspecified: Secondary | ICD-10-CM | POA: Diagnosis not present

## 2017-12-22 LAB — LIPID PANEL
CHOL/HDL RATIO: 3.8 ratio (ref 0.0–5.0)
Cholesterol, Total: 165 mg/dL (ref 100–199)
HDL: 43 mg/dL (ref >39)
LDL Calculated: 86 mg/dL (ref 0–99)
TRIGLYCERIDES: 180 mg/dL — AB (ref 0–149)
VLDL Cholesterol Cal: 36 mg/dL (ref 5–40)

## 2017-12-24 DIAGNOSIS — R109 Unspecified abdominal pain: Secondary | ICD-10-CM | POA: Diagnosis not present

## 2017-12-24 DIAGNOSIS — M545 Low back pain: Secondary | ICD-10-CM | POA: Diagnosis not present

## 2017-12-24 DIAGNOSIS — E785 Hyperlipidemia, unspecified: Secondary | ICD-10-CM | POA: Diagnosis not present

## 2017-12-24 DIAGNOSIS — I10 Essential (primary) hypertension: Secondary | ICD-10-CM | POA: Diagnosis not present

## 2017-12-24 DIAGNOSIS — K219 Gastro-esophageal reflux disease without esophagitis: Secondary | ICD-10-CM | POA: Diagnosis not present

## 2017-12-24 DIAGNOSIS — R1031 Right lower quadrant pain: Secondary | ICD-10-CM | POA: Diagnosis not present

## 2018-01-02 DIAGNOSIS — S39012A Strain of muscle, fascia and tendon of lower back, initial encounter: Secondary | ICD-10-CM | POA: Diagnosis not present

## 2018-01-03 DIAGNOSIS — M62830 Muscle spasm of back: Secondary | ICD-10-CM | POA: Diagnosis not present

## 2018-01-03 DIAGNOSIS — M6289 Other specified disorders of muscle: Secondary | ICD-10-CM | POA: Diagnosis not present

## 2018-01-04 DIAGNOSIS — M6289 Other specified disorders of muscle: Secondary | ICD-10-CM | POA: Diagnosis not present

## 2018-01-04 DIAGNOSIS — M62830 Muscle spasm of back: Secondary | ICD-10-CM | POA: Diagnosis not present

## 2018-01-08 DIAGNOSIS — M62830 Muscle spasm of back: Secondary | ICD-10-CM | POA: Diagnosis not present

## 2018-01-08 DIAGNOSIS — M6289 Other specified disorders of muscle: Secondary | ICD-10-CM | POA: Diagnosis not present

## 2018-01-15 DIAGNOSIS — M6289 Other specified disorders of muscle: Secondary | ICD-10-CM | POA: Diagnosis not present

## 2018-01-15 DIAGNOSIS — M62830 Muscle spasm of back: Secondary | ICD-10-CM | POA: Diagnosis not present

## 2018-01-16 DIAGNOSIS — M1712 Unilateral primary osteoarthritis, left knee: Secondary | ICD-10-CM | POA: Diagnosis not present

## 2018-01-19 DIAGNOSIS — M6289 Other specified disorders of muscle: Secondary | ICD-10-CM | POA: Diagnosis not present

## 2018-01-19 DIAGNOSIS — M62830 Muscle spasm of back: Secondary | ICD-10-CM | POA: Diagnosis not present

## 2018-01-31 DIAGNOSIS — H35033 Hypertensive retinopathy, bilateral: Secondary | ICD-10-CM | POA: Diagnosis not present

## 2018-01-31 DIAGNOSIS — H524 Presbyopia: Secondary | ICD-10-CM | POA: Diagnosis not present

## 2018-01-31 DIAGNOSIS — H2512 Age-related nuclear cataract, left eye: Secondary | ICD-10-CM | POA: Diagnosis not present

## 2018-01-31 DIAGNOSIS — H25811 Combined forms of age-related cataract, right eye: Secondary | ICD-10-CM | POA: Diagnosis not present

## 2018-01-31 DIAGNOSIS — H52223 Regular astigmatism, bilateral: Secondary | ICD-10-CM | POA: Diagnosis not present

## 2018-01-31 DIAGNOSIS — H5203 Hypermetropia, bilateral: Secondary | ICD-10-CM | POA: Diagnosis not present

## 2018-01-31 DIAGNOSIS — H3401 Transient retinal artery occlusion, right eye: Secondary | ICD-10-CM | POA: Diagnosis not present

## 2018-02-08 ENCOUNTER — Telehealth: Payer: Self-pay

## 2018-02-08 NOTE — Telephone Encounter (Signed)
Called to see if pt was still taking pushtronex repatha 420mg  because rx plan is expired or no longer in use. Left msg

## 2018-02-08 NOTE — Telephone Encounter (Signed)
Pt called back to confirm insurance info

## 2018-02-28 ENCOUNTER — Other Ambulatory Visit: Payer: Self-pay | Admitting: Cardiovascular Disease

## 2018-03-07 ENCOUNTER — Other Ambulatory Visit: Payer: Self-pay | Admitting: Orthopedic Surgery

## 2018-03-09 NOTE — Patient Instructions (Signed)
Patrick Vargas  03/09/2018   Your procedure is scheduled on: Monday 03/19/2018  Report to [facility] Main  Entrance              Report to admitting at   1045 AM    Call this number if you have problems the morning of surgery [phone]    Remember: Do not eat food or drink liquids :After Midnight.              BRUSH YOUR TEETH MORNING OF SURGERY AND RINSE YOUR MOUTH OUT, NO CHEWING GUM CANDY OR MINTS.     Take these medicines the morning of surgery with A SIP OF WATER: Amlodipine (Norvasc), Cetirizine (Zyrtec)                                You may not have any metal on your body including hair pins and              piercings  Do not wear jewelry, make-up, lotions, powders or perfumes, deodorant                         Men may shave face and neck.   Do not bring valuables to the hospital. [place].  Contacts, dentures or bridgework may not be worn into surgery.  Leave suitcase in the car. After surgery it may be brought to your room.                  Please read over the following fact sheets you were given: _____________________________________________________________________             [facility] - Preparing for Surgery Before surgery, you can play an important role.  Because skin is not sterile, your skin needs to be as free of germs as possible.  You can reduce the number of germs on your skin by washing with CHG (chlorahexidine gluconate) soap before surgery.  CHG is an antiseptic cleaner which kills germs and bonds with the skin to continue killing germs even after washing. Please DO NOT use if you have an allergy to CHG or antibacterial soaps.  If your skin becomes reddened/irritated stop using the CHG and inform your nurse when you arrive at Short Stay. Do not shave (including legs and underarms) for at least 48 hours prior to the first CHG shower.  You may shave your face/neck. Please  follow these instructions carefully:  1.  Shower with CHG Soap the night before surgery and the  morning of Surgery.  2.  If you choose to wash your hair, wash your hair first as usual with your  normal  shampoo.  3.  After you shampoo, rinse your hair and body thoroughly to remove the  shampoo.                           4.  Use CHG as you would any other liquid soap.  You can apply chg directly  to the skin and wash                       Gently with a scrungie or clean  washcloth.  5.  Apply the CHG Soap to your body ONLY FROM THE NECK DOWN.   Do not use on face/ open                           Wound or open sores. Avoid contact with eyes, ears mouth and genitals (private parts).                       Wash face,  Genitals (private parts) with your normal soap.             6.  Wash thoroughly, paying special attention to the area where your surgery  will be performed.  7.  Thoroughly rinse your body with warm water from the neck down.  8.  DO NOT shower/wash with your normal soap after using and rinsing off  the CHG Soap.                9.  Pat yourself dry with a clean towel.            10.  Wear clean pajamas.            11.  Place clean sheets on your bed the night of your first shower and do not  sleep with pets. Day of Surgery : Do not apply any lotions/deodorants the morning of surgery.  Please wear clean clothes to the hospital/surgery center.  FAILURE TO FOLLOW THESE INSTRUCTIONS MAY RESULT IN THE CANCELLATION OF YOUR SURGERY PATIENT SIGNATURE_________________________________  NURSE SIGNATURE__________________________________  ________________________________________________________________________   [name]  An incentive spirometer is a tool that can help keep your lungs clear and active. This tool measures how well you are filling your lungs with each breath. Taking long deep breaths may help reverse or decrease the chance of developing breathing (pulmonary) problems  (especially infection) following:  A long period of time when you are unable to move or be active. BEFORE THE PROCEDURE   If the spirometer includes an indicator to show your best effort, your nurse or respiratory therapist will set it to a desired goal.  If possible, sit up straight or lean slightly forward. Try not to slouch.  Hold the incentive spirometer in an upright position. INSTRUCTIONS FOR USE  1. Sit on the edge of your bed if possible, or sit up as far as you can in bed or on a chair. 2. Hold the incentive spirometer in an upright position. 3. Breathe out normally. 4. Place the mouthpiece in your mouth and seal your lips tightly around it. 5. Breathe in slowly and as deeply as possible, raising the piston or the ball toward the top of the column. 6. Hold your breath for 3-5 seconds or for as long as possible. Allow the piston or ball to fall to the bottom of the column. 7. Remove the mouthpiece from your mouth and breathe out normally. 8. Rest for a few seconds and repeat Steps 1 through 7 at least 10 times every 1-2 hours when you are awake. Take your time and take a few normal breaths between deep breaths. 9. The spirometer may include an indicator to show your best effort. Use the indicator as a goal to work toward during each repetition. 10. After each set of 10 deep breaths, practice coughing to be sure your lungs are clear. If you have an incision (the cut made at the time of surgery), support your incision when coughing by  placing a pillow or rolled up towels firmly against it. Once you are able to get out of bed, walk around indoors and cough well. You may stop using the incentive spirometer when instructed by your caregiver.  RISKS AND COMPLICATIONS  Take your time so you do not get dizzy or light-headed.  If you are in pain, you may need to take or ask for pain medication before doing incentive spirometry. It is harder to take a deep breath if you are having  pain. AFTER USE  Rest and breathe slowly and easily.  It can be helpful to keep track of a log of your progress. Your caregiver can provide you with a simple table to help with this. If you are using the spirometer at home, follow these instructions: [place] IF:   You are having difficultly using the spirometer.  You have trouble using the spirometer as often as instructed.  Your pain medication is not giving enough relief while using the spirometer.  You develop fever of 100.5 F (38.1 C) or higher. SEEK IMMEDIATE MEDICAL CARE IF:   You cough up bloody sputum that had not been present before.  You develop fever of 102 F (38.9 C) or greater.  You develop worsening pain at or near the incision site. MAKE SURE YOU:   Understand these instructions.  Will watch your condition.  Will get help right away if you are not doing well or get worse. Document Released: 06/13/2006 Document Revised: 04/25/2011 Document Reviewed: 08/14/2006 ExitCare Patient Information 2014 [employer], [place].   ________________________________________________________________________  WHAT IS A BLOOD TRANSFUSION? Blood Transfusion Information  A transfusion is the replacement of blood or some of its parts. Blood is made up of multiple cells which provide different functions.  Red blood cells carry oxygen and are used for blood loss replacement.  White blood cells fight against infection.  Platelets control bleeding.  Plasma helps clot blood.  Other blood products are available for specialized needs, such as hemophilia or other clotting disorders. BEFORE THE TRANSFUSION  Who gives blood for transfusions?   Healthy volunteers who are fully evaluated to make sure their blood is safe. This is blood bank blood. Transfusion therapy is the safest it has ever been in the practice of medicine. Before blood is taken from a donor, a complete history is taken to make sure that person has no history  of diseases nor engages in risky social behavior (examples are intravenous drug use or sexual activity with multiple partners). The donor's travel history is screened to minimize risk of transmitting infections, such as malaria. The donated blood is tested for signs of infectious diseases, such as HIV and hepatitis. The blood is then tested to be sure it is compatible with you in order to minimize the chance of a transfusion reaction. If you or a relative donates blood, this is often done in anticipation of surgery and is not appropriate for emergency situations. It takes many days to process the donated blood. RISKS AND COMPLICATIONS Although transfusion therapy is very safe and saves many lives, the main dangers of transfusion include:   Getting an infectious disease.  Developing a transfusion reaction. This is an allergic reaction to something in the blood you were given. Every precaution is taken to prevent this. The decision to have a blood transfusion has been considered carefully by your caregiver before blood is given. Blood is not given unless the benefits outweigh the risks. AFTER THE TRANSFUSION  Right after receiving a blood  transfusion, you will usually feel much better and more energetic. This is especially true if your red blood cells have gotten low (anemic). The transfusion raises the level of the red blood cells which carry oxygen, and this usually causes an energy increase.  The nurse administering the transfusion will monitor you carefully for complications. HOME CARE INSTRUCTIONS  No special instructions are needed after a transfusion. You may find your energy is better. Speak with your caregiver about any limitations on activity for underlying diseases you may have. SEEK MEDICAL CARE IF:   Your condition is not improving after your transfusion.  You develop redness or irritation at the intravenous (IV) site. SEEK IMMEDIATE MEDICAL CARE IF:  Any of the following symptoms  occur over the next 12 hours:  Shaking chills.  You have a temperature by mouth above 102 F (38.9 C), not controlled by medicine.  Chest, back, or muscle pain.  People around you feel you are not acting correctly or are confused.  Shortness of breath or difficulty breathing.  Dizziness and fainting.  You get a rash or develop hives.  You have a decrease in urine output.  Your urine turns a dark color or changes to pink, red, or brown. Any of the following symptoms occur over the next 10 days:  You have a temperature by mouth above 102 F (38.9 C), not controlled by medicine.  Shortness of breath.  Weakness after normal activity.  The white part of the eye turns yellow (jaundice).  You have a decrease in the amount of urine or are urinating less often.  Your urine turns a dark color or changes to pink, red, or brown. Document Released: 01/29/2000 Document Revised: 04/25/2011 Document Reviewed: 09/17/2007 [facility] Patient Information 2014 [place], [place].  _______________________________________________________________________

## 2018-03-12 ENCOUNTER — Encounter (HOSPITAL_COMMUNITY)
Admission: RE | Admit: 2018-03-12 | Discharge: 2018-03-12 | Disposition: A | Discharge status: Home / Self Care | Payer: Medicare Other | Source: Ambulatory Visit | Attending: Orthopedic Surgery | Admitting: Orthopedic Surgery

## 2018-03-12 ENCOUNTER — Ambulatory Visit (HOSPITAL_COMMUNITY)
Admission: RE | Admit: 2018-03-12 | Discharge: 2018-03-12 | Disposition: A | Discharge status: Home / Self Care | Payer: Medicare Other | Source: Ambulatory Visit | Attending: Orthopedic Surgery | Admitting: Orthopedic Surgery

## 2018-03-12 ENCOUNTER — Encounter (HOSPITAL_COMMUNITY): Payer: Self-pay

## 2018-03-12 ENCOUNTER — Other Ambulatory Visit: Payer: Self-pay

## 2018-03-12 DIAGNOSIS — Z01818 Encounter for other preprocedural examination: Secondary | ICD-10-CM

## 2018-03-12 DIAGNOSIS — J984 Other disorders of lung: Secondary | ICD-10-CM | POA: Diagnosis not present

## 2018-03-12 LAB — CBC WITH DIFFERENTIAL/PLATELET
Abs Immature Granulocytes: 0.02 10*3/uL (ref 0.00–0.07)
BASOS PCT: 1 %
Basophils Absolute: 0.1 10*3/uL (ref 0.0–0.1)
Eosinophils Absolute: 0.2 10*3/uL (ref 0.0–0.5)
Eosinophils Relative: 4 %
HCT: 45.5 % (ref 39.0–52.0)
Hemoglobin: 14.9 g/dL (ref 13.0–17.0)
Immature Granulocytes: 0 %
Lymphocytes Relative: 31 %
Lymphs Abs: 1.8 10*3/uL (ref 0.7–4.0)
MCH: 29.4 pg (ref 26.0–34.0)
MCHC: 32.7 g/dL (ref 30.0–36.0)
MCV: 89.9 fL (ref 80.0–100.0)
Monocytes Absolute: 0.6 10*3/uL (ref 0.1–1.0)
Monocytes Relative: 10 %
Neutro Abs: 3.2 10*3/uL (ref 1.7–7.7)
Neutrophils Relative %: 54 %
Platelets: 214 10*3/uL (ref 150–400)
RBC: 5.06 MIL/uL (ref 4.22–5.81)
RDW: 13.3 % (ref 11.5–15.5)
WBC: 5.8 10*3/uL (ref 4.0–10.5)
nRBC: 0 % (ref 0.0–0.2)

## 2018-03-12 LAB — BASIC METABOLIC PANEL
Anion gap: 8 (ref 5–15)
BUN: 34 mg/dL — ABNORMAL HIGH (ref 8–23)
CO2: 28 mmol/L (ref 22–32)
CREATININE: 1.38 mg/dL — AB (ref 0.61–1.24)
Calcium: 9.3 mg/dL (ref 8.9–10.3)
Chloride: 101 mmol/L (ref 98–111)
GFR calc Af Amer: 58 mL/min — ABNORMAL LOW (ref >60)
GFR calc non Af Amer: 50 mL/min — ABNORMAL LOW (ref >60)
Glucose, Bld: 76 mg/dL (ref 70–99)
Potassium: 3.7 mmol/L (ref 3.5–5.1)
Sodium: 137 mmol/L (ref 135–145)

## 2018-03-12 LAB — SURGICAL PCR SCREEN
MRSA, PCR: NEGATIVE
Staphylococcus aureus: NEGATIVE

## 2018-03-12 LAB — URINALYSIS, ROUTINE W REFLEX MICROSCOPIC
Bilirubin Urine: NEGATIVE
Glucose, UA: NEGATIVE mg/dL
Hgb urine dipstick: NEGATIVE
Ketones, ur: NEGATIVE mg/dL
Leukocytes, UA: NEGATIVE
Nitrite: NEGATIVE
Protein, ur: NEGATIVE mg/dL
Specific Gravity, Urine: 1.017 (ref 1.005–1.030)
pH: 5 (ref 5.0–8.0)

## 2018-03-12 LAB — APTT: APTT: 35 s (ref 24–36)

## 2018-03-12 LAB — ABO/RH: ABO/RH(D): A POS

## 2018-03-12 LAB — PROTIME-INR
INR: 1.02
Prothrombin Time: 13.3 seconds (ref 11.4–15.2)

## 2018-03-12 NOTE — Progress Notes (Signed)
Chart given to Konrad Felix, PA Anesthesia to review EKG from today and labs.

## 2018-03-13 NOTE — Progress Notes (Signed)
Anesthesia Chart Review   Case:  [id number] Date/Time:  03/19/18 1300   Procedure:  TOTAL KNEE ARTHROPLASTY (Left )   Anesthesia type:  Spinal   Pre-op diagnosis:  LEFT KNEE OSTEOARTHRITIS   Location:  [place] / [facility]   Surgeon:  Dorna Leitz, MD      DISCUSSION: 78 yo never smoker with h/o PVC's, PVD, HTN, GERD, carotid artery disease (s/p right endarterectomy 03/06/17, left endarterectomy 04/12/17), left knee OA scheduled for above surgery on 03/19/2018.   Pt last seen by vascular surgery on 11/09/17, seen by Clemon Chambers, NP.  S/p right CEA 03/06/17 and left CEA 04/12/17 with resolution of presenting symptoms with 1-39% stenosis bilaterally on carotid doppler.  He was advised to follow up in 6 months, appt scheduled 05/10/18.   Pt can proceed with planned procedure barring acute status change.  VS: T 98.3  P 73  R 18  BP 127/70  O2 99%  HT 5'9"  WT 191.2  PROVIDERS: Maurice Small, MD is PCP   Ruta Hinds, MD is Vascular Surgeon LABS: Labs reviewed: Acceptable for surgery. (all labs ordered are listed, but only abnormal results are displayed)  Labs Reviewed  BASIC METABOLIC PANEL - Abnormal; Notable for the following components:      Result Value   BUN 34 (*)    Creatinine, Ser 1.38 (*)    GFR calc non Af Amer 50 (*)    GFR calc Af Amer 58 (*)    All other components within normal limits  SURGICAL PCR SCREEN  APTT  CBC WITH DIFFERENTIAL/PLATELET  PROTIME-INR  URINALYSIS, ROUTINE W REFLEX MICROSCOPIC  TYPE AND SCREEN  ABO/RH     IMAGES: Chest Xray 03/12/2018 FINDINGS: Scarring at the left base. Right lung clear. Heart is normal size. No effusions or acute bony abnormality.  IMPRESSION: Left basilar scarring.  No active disease.  EKG: 03/12/2018 Rate 63bpm Normal sinus rhythm Left axis deviation Abnormal ECG No change since previous tracing  CV: Carotid Doppler 11/09/17 Status post endarterectomy   Final Interpretation: Right Carotid: Velocities in the  right ICA are consistent with a 1-39% stenosis.  Left Carotid: Velocities in the left ICA are consistent with a 1-39% stenosis.  Vertebrals:  Bilateral vertebral arteries demonstrate antegrade flow. Subclavians: Normal flow hemodynamics were seen in bilateral subclavian              arteries. Past Medical History:  Diagnosis Date  . Arthritis   . Carotid artery occlusion   . Dyslipidemia   . GERD (gastroesophageal reflux disease)   . Headache    ocular migraines  . History of kidney stones   . Peripheral vascular disease ([place])    Carotid stenosis right   . PVC's (premature ventricular contractions)   . Systemic hypertension   . 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 BUNIONECTOMY  1994   Left foot  . CAROTID ENDARTERECTOMY    . CARPAL TUNNEL RELEASE  1999   Left hand  . ENDARTERECTOMY Right 03/06/2017   Procedure: ENDARTERECTOMY CAROTID RIGHT;  Surgeon: Elam Dutch, MD;  Location: [place];  Service: Vascular;  Laterality: Right;  . ENDARTERECTOMY Left 04/12/2017   Procedure: ENDARTERECTOMY CAROTID LEFT;  Surgeon: Elam Dutch, MD;  Location: [place];  Service: Vascular;  Laterality: Left;  . PATCH ANGIOPLASTY Left 04/12/2017   Procedure: PATCH ANGIOPLASTY LEFT CAROTID ARTERY;  Surgeon: Elam Dutch,  MD;  Location: [facility];  Service: Vascular;  Laterality: Left;  . TENDON REPAIR Right    right foot  . TONSILLECTOMY    . TOTAL KNEE ARTHROPLASTY  12/04/2010   Right    MEDICATIONS: . acetaminophen (TYLENOL) 500 MG tablet  . amLODipine (NORVASC) 10 MG tablet  . aspirin EC 81 MG tablet  . CELEBREX 200 MG capsule  . cetirizine (ZYRTEC) 10 MG tablet  . Coenzyme Q10 200 MG capsule  . Evolocumab with Infusor ([place]) 420 MG/3.5ML SOCT  . hydrochlorothiazide (HYDRODIURIL) 25 MG tablet  . HYDROcodone-acetaminophen (NORCO/VICODIN) 5-325 MG tablet  . lisinopril (PRINIVIL,ZESTRIL) 20 MG tablet   . pravastatin (PRAVACHOL) 40 MG tablet  . traMADol (ULTRAM) 50 MG tablet   No current facility-administered medications for this encounter.    Maia Plan [facility] Pre-Surgical Testing [phone] 03/13/18 10:44 AM

## 2018-03-13 NOTE — Anesthesia Preprocedure Evaluation (Addendum)
Anesthesia Evaluation  Patient identified by MRN, date of birth, ID band Patient awake    Reviewed: Allergy & Precautions, NPO status , Patient's Chart, lab work & pertinent test results  History of Anesthesia Complications Negative for: history of anesthetic complications  Airway Mallampati: III  TM Distance: >3 FB Neck ROM: Full    Dental  (+) Teeth Intact, Dental Advisory Given   Pulmonary neg pulmonary ROS,    Pulmonary exam normal breath sounds clear to auscultation       Cardiovascular hypertension, + Peripheral Vascular Disease  Normal cardiovascular exam Rhythm:Regular Rate:Normal     Neuro/Psych  Headaches,    GI/Hepatic Neg liver ROS, GERD  ,  Endo/Other  negative endocrine ROS  Renal/GU negative Renal ROS     Musculoskeletal  (+) Arthritis ,   Abdominal   Peds  Hematology negative hematology ROS (+)   Anesthesia Other Findings Day of surgery medications reviewed with the patient.  Reproductive/Obstetrics                           Anesthesia Physical Anesthesia Plan  ASA: III  Anesthesia Plan: Spinal   Post-op Pain Management:  Regional for Post-op pain   Induction:   PONV Risk Score and Plan: 1 and Treatment may vary due to age or medical condition, Ondansetron, Propofol infusion and Dexamethasone  Airway Management Planned: Natural Airway and Simple Face Mask  Additional Equipment:   Intra-op Plan:   Post-operative Plan:   Informed Consent: I have reviewed the patients History and Physical, chart, labs and discussed the procedure including the risks, benefits and alternatives for the proposed anesthesia with the patient or authorized representative who has indicated his/her understanding and acceptance.     Dental advisory given  Plan Discussed with: CRNA  Anesthesia Plan Comments: (See PST note 03/12/18, Konrad Felix, PA-C)       Anesthesia Quick  Evaluation

## 2018-03-15 ENCOUNTER — Other Ambulatory Visit: Payer: Self-pay | Admitting: Orthopedic Surgery

## 2018-03-15 NOTE — Care Plan (Signed)
Spoke with patient prior to surgery. He will discharge to home with family and HHPT. Has equipment other than CPM and it has been delivered by [employer]. He is aware and agreeable with plan.   Patrick Vargas, [place]

## 2018-03-16 NOTE — Progress Notes (Signed)
Called and spoke to patient and instructed him to arrive at [facility] on 03/19/2018 for his surgery at 1100 am and may have clear liquids from midnight up until 0800 am then nothing until after surgery. Patient verbalized understanding.

## 2018-03-18 MED ORDER — BUPIVACAINE LIPOSOME 1.3 % IJ SUSP
20.0000 mL | Freq: Once | INTRAMUSCULAR | Status: DC
  Filled 2018-03-18: qty 20

## 2018-03-18 MED ORDER — TRANEXAMIC ACID 1000 MG/10ML IV SOLN
2000.0000 mg | INTRAVENOUS | Status: DC
  Filled 2018-03-18: qty 20

## 2018-03-19 ENCOUNTER — Encounter (HOSPITAL_COMMUNITY): Payer: Self-pay | Admitting: *Deleted

## 2018-03-19 ENCOUNTER — Ambulatory Visit (HOSPITAL_COMMUNITY)
Admission: RE | Admit: 2018-03-19 | Discharge: 2018-03-20 | Disposition: A | Discharge status: Home / Self Care | Payer: Medicare Other | Source: Ambulatory Visit | Attending: Orthopedic Surgery | Admitting: Orthopedic Surgery

## 2018-03-19 ENCOUNTER — Encounter (HOSPITAL_COMMUNITY)
Admission: RE | Disposition: A | Discharge status: Home / Self Care | Payer: Self-pay | Source: Ambulatory Visit | Attending: Orthopedic Surgery

## 2018-03-19 ENCOUNTER — Other Ambulatory Visit: Payer: Self-pay

## 2018-03-19 ENCOUNTER — Ambulatory Visit (HOSPITAL_COMMUNITY): Payer: Medicare Other | Admitting: Anesthesiology

## 2018-03-19 ENCOUNTER — Ambulatory Visit (HOSPITAL_COMMUNITY): Payer: Medicare Other | Admitting: Physician Assistant

## 2018-03-19 DIAGNOSIS — Z880 Allergy status to penicillin: Secondary | ICD-10-CM | POA: Insufficient documentation

## 2018-03-19 DIAGNOSIS — M1712 Unilateral primary osteoarthritis, left knee: Secondary | ICD-10-CM | POA: Diagnosis not present

## 2018-03-19 DIAGNOSIS — Z79899 Other long term (current) drug therapy: Secondary | ICD-10-CM | POA: Diagnosis not present

## 2018-03-19 DIAGNOSIS — Z888 Allergy status to other drugs, medicaments and biological substances status: Secondary | ICD-10-CM | POA: Insufficient documentation

## 2018-03-19 DIAGNOSIS — Z885 Allergy status to narcotic agent status: Secondary | ICD-10-CM | POA: Insufficient documentation

## 2018-03-19 DIAGNOSIS — I10 Essential (primary) hypertension: Secondary | ICD-10-CM | POA: Diagnosis not present

## 2018-03-19 DIAGNOSIS — I739 Peripheral vascular disease, unspecified: Secondary | ICD-10-CM | POA: Insufficient documentation

## 2018-03-19 DIAGNOSIS — E785 Hyperlipidemia, unspecified: Secondary | ICD-10-CM | POA: Insufficient documentation

## 2018-03-19 DIAGNOSIS — G8918 Other acute postprocedural pain: Secondary | ICD-10-CM | POA: Diagnosis not present

## 2018-03-19 HISTORY — PX: TOTAL KNEE ARTHROPLASTY: SHX125

## 2018-03-19 LAB — CBC WITH DIFFERENTIAL/PLATELET
Abs Immature Granulocytes: 0.03 10*3/uL (ref 0.00–0.07)
Basophils Absolute: 0 10*3/uL (ref 0.0–0.1)
Basophils Relative: 0 %
Eosinophils Absolute: 0 10*3/uL (ref 0.0–0.5)
Eosinophils Relative: 0 %
HCT: 41.2 % (ref 39.0–52.0)
Hemoglobin: 13.9 g/dL (ref 13.0–17.0)
Immature Granulocytes: 1 %
Lymphocytes Relative: 25 %
Lymphs Abs: 1.1 10*3/uL (ref 0.7–4.0)
MCH: 30.3 pg (ref 26.0–34.0)
MCHC: 33.7 g/dL (ref 30.0–36.0)
MCV: 89.8 fL (ref 80.0–100.0)
Monocytes Absolute: 0.7 10*3/uL (ref 0.1–1.0)
Monocytes Relative: 16 %
Neutro Abs: 2.6 10*3/uL (ref 1.7–7.7)
Neutrophils Relative %: 58 %
Platelets: 139 10*3/uL — ABNORMAL LOW (ref 150–400)
RBC: 4.59 MIL/uL (ref 4.22–5.81)
RDW: 13.6 % (ref 11.5–15.5)
WBC: 4.5 10*3/uL (ref 4.0–10.5)
nRBC: 0 % (ref 0.0–0.2)

## 2018-03-19 LAB — TYPE AND SCREEN
ABO/RH(D): A POS
Antibody Screen: NEGATIVE

## 2018-03-19 SURGERY — ARTHROPLASTY, KNEE, TOTAL
Anesthesia: Spinal | Site: Knee | Laterality: Left

## 2018-03-19 MED ORDER — PROPOFOL 10 MG/ML IV BOLUS
INTRAVENOUS | Status: AC
  Filled 2018-03-19: qty 40

## 2018-03-19 MED ORDER — PROPOFOL 10 MG/ML IV BOLUS
INTRAVENOUS | Status: DC | PRN
  Administered 2018-03-19 (×2): 20 mg via INTRAVENOUS

## 2018-03-19 MED ORDER — SODIUM CHLORIDE 0.9 % IR SOLN
Status: DC | PRN
  Administered 2018-03-19: 1000 mL

## 2018-03-19 MED ORDER — ONDANSETRON HCL 4 MG/2ML IJ SOLN
INTRAMUSCULAR | Status: DC | PRN
  Administered 2018-03-19: 4 mg via INTRAVENOUS

## 2018-03-19 MED ORDER — CHLORHEXIDINE GLUCONATE 4 % EX LIQD: 60.0000 mL | Freq: Once | CUTANEOUS | Status: DC

## 2018-03-19 MED ORDER — BUPIVACAINE-EPINEPHRINE (PF) 0.5% -1:200000 IJ SOLN
INTRAMUSCULAR | Status: DC | PRN
  Administered 2018-03-19: 15 mL via PERINEURAL

## 2018-03-19 MED ORDER — DEXAMETHASONE SODIUM PHOSPHATE 10 MG/ML IJ SOLN
10.0000 mg | Freq: Two times a day (BID) | INTRAMUSCULAR | Status: DC
  Administered 2018-03-19 – 2018-03-20 (×2): 10 mg via INTRAVENOUS
  Filled 2018-03-19 (×2): qty 1

## 2018-03-19 MED ORDER — BUPIVACAINE LIPOSOME 1.3 % IJ SUSP
INTRAMUSCULAR | Status: DC | PRN
  Administered 2018-03-19: 20 mL

## 2018-03-19 MED ORDER — DIPHENHYDRAMINE HCL 12.5 MG/5ML PO ELIX: 12.5000 mg | ORAL_SOLUTION | ORAL | Status: DC | PRN

## 2018-03-19 MED ORDER — FENTANYL CITRATE (PF) 100 MCG/2ML IJ SOLN: 50.0000 ug | INTRAMUSCULAR | Status: DC

## 2018-03-19 MED ORDER — FENTANYL CITRATE (PF) 100 MCG/2ML IJ SOLN
INTRAMUSCULAR | Status: AC
  Administered 2018-03-19: 50 ug
  Filled 2018-03-19: qty 2

## 2018-03-19 MED ORDER — VANCOMYCIN HCL IN DEXTROSE 1-5 GM/200ML-% IV SOLN
1000.0000 mg | INTRAVENOUS | Status: AC
  Administered 2018-03-19: 1000 mg via INTRAVENOUS
  Filled 2018-03-19: qty 200

## 2018-03-19 MED ORDER — ASPIRIN EC 325 MG PO TBEC: 325.0000 mg | DELAYED_RELEASE_TABLET | Freq: Two times a day (BID) | ORAL | 0 refills | Status: DC

## 2018-03-19 MED ORDER — DOCUSATE SODIUM 100 MG PO CAPS: 100.0000 mg | ORAL_CAPSULE | Freq: Two times a day (BID) | ORAL | 0 refills | Status: DC

## 2018-03-19 MED ORDER — PHENYLEPHRINE HCL 10 MG/ML IJ SOLN
INTRAMUSCULAR | Status: AC
  Filled 2018-03-19: qty 2

## 2018-03-19 MED ORDER — TRANEXAMIC ACID-NACL 1000-0.7 MG/100ML-% IV SOLN
1000.0000 mg | Freq: Once | INTRAVENOUS | Status: AC
  Administered 2018-03-19: 1000 mg via INTRAVENOUS
  Filled 2018-03-19: qty 100

## 2018-03-19 MED ORDER — BISACODYL 5 MG PO TBEC: 5.0000 mg | DELAYED_RELEASE_TABLET | Freq: Every day | ORAL | Status: DC | PRN

## 2018-03-19 MED ORDER — ONDANSETRON HCL 4 MG/2ML IJ SOLN
INTRAMUSCULAR | Status: AC
  Filled 2018-03-19: qty 2

## 2018-03-19 MED ORDER — LACTATED RINGERS IV SOLN
INTRAVENOUS | Status: DC
  Administered 2018-03-19: 13:00:00 via INTRAVENOUS

## 2018-03-19 MED ORDER — LISINOPRIL 20 MG PO TABS
20.0000 mg | ORAL_TABLET | Freq: Every day | ORAL | Status: DC
  Administered 2018-03-19: 20 mg via ORAL
  Filled 2018-03-19: qty 1

## 2018-03-19 MED ORDER — HYDROCODONE-ACETAMINOPHEN 10-325 MG PO TABS: 1.0000 | ORAL_TABLET | ORAL | 0 refills | Status: DC | PRN

## 2018-03-19 MED ORDER — LIDOCAINE 2% (20 MG/ML) 5 ML SYRINGE
INTRAMUSCULAR | Status: AC
  Filled 2018-03-19: qty 5

## 2018-03-19 MED ORDER — ONDANSETRON HCL 4 MG PO TABS: 4.0000 mg | ORAL_TABLET | Freq: Four times a day (QID) | ORAL | Status: DC | PRN

## 2018-03-19 MED ORDER — SODIUM CHLORIDE 0.9 % IV SOLN
INTRAVENOUS | Status: DC
  Administered 2018-03-19 – 2018-03-20 (×2): via INTRAVENOUS

## 2018-03-19 MED ORDER — ASPIRIN EC 325 MG PO TBEC
325.0000 mg | DELAYED_RELEASE_TABLET | Freq: Two times a day (BID) | ORAL | Status: DC
  Administered 2018-03-19 – 2018-03-20 (×2): 325 mg via ORAL
  Filled 2018-03-19 (×2): qty 1

## 2018-03-19 MED ORDER — TRANEXAMIC ACID-NACL 1000-0.7 MG/100ML-% IV SOLN
1000.0000 mg | INTRAVENOUS | Status: AC
  Administered 2018-03-19: 1000 mg via INTRAVENOUS
  Filled 2018-03-19: qty 100

## 2018-03-19 MED ORDER — ACETAMINOPHEN 10 MG/ML IV SOLN: 1000.0000 mg | Freq: Once | INTRAVENOUS | Status: DC | PRN

## 2018-03-19 MED ORDER — BUPIVACAINE HCL (PF) 0.5 % IJ SOLN
INTRAMUSCULAR | Status: DC | PRN
  Administered 2018-03-19: 30 mL

## 2018-03-19 MED ORDER — PROPOFOL 10 MG/ML IV BOLUS
INTRAVENOUS | Status: AC
  Filled 2018-03-19: qty 20

## 2018-03-19 MED ORDER — DEXAMETHASONE SODIUM PHOSPHATE 10 MG/ML IJ SOLN
INTRAMUSCULAR | Status: AC
  Filled 2018-03-19: qty 1

## 2018-03-19 MED ORDER — DOCUSATE SODIUM 100 MG PO CAPS
100.0000 mg | ORAL_CAPSULE | Freq: Two times a day (BID) | ORAL | Status: DC
  Administered 2018-03-19 – 2018-03-20 (×2): 100 mg via ORAL
  Filled 2018-03-19 (×2): qty 1

## 2018-03-19 MED ORDER — CEFAZOLIN SODIUM-DEXTROSE 2-4 GM/100ML-% IV SOLN
2.0000 g | Freq: Four times a day (QID) | INTRAVENOUS | Status: AC
  Administered 2018-03-19 – 2018-03-20 (×2): 2 g via INTRAVENOUS
  Filled 2018-03-19 (×2): qty 100

## 2018-03-19 MED ORDER — DEXAMETHASONE SODIUM PHOSPHATE 10 MG/ML IJ SOLN
INTRAMUSCULAR | Status: DC | PRN
  Administered 2018-03-19: 10 mg via INTRAVENOUS

## 2018-03-19 MED ORDER — EPHEDRINE SULFATE-NACL 50-0.9 MG/10ML-% IV SOSY
PREFILLED_SYRINGE | INTRAVENOUS | Status: DC | PRN
  Administered 2018-03-19: 10 mg via INTRAVENOUS

## 2018-03-19 MED ORDER — PROMETHAZINE HCL 25 MG/ML IJ SOLN: 6.2500 mg | INTRAMUSCULAR | Status: DC | PRN

## 2018-03-19 MED ORDER — MIDAZOLAM HCL 2 MG/2ML IJ SOLN: 1.0000 mg | INTRAMUSCULAR | Status: DC

## 2018-03-19 MED ORDER — LIDOCAINE 2% (20 MG/ML) 5 ML SYRINGE
INTRAMUSCULAR | Status: DC | PRN
  Administered 2018-03-19: 60 mg via INTRAVENOUS

## 2018-03-19 MED ORDER — EPHEDRINE 5 MG/ML INJ
INTRAVENOUS | Status: AC
  Filled 2018-03-19: qty 10

## 2018-03-19 MED ORDER — LACTATED RINGERS IV SOLN
INTRAVENOUS | Status: DC | PRN
  Administered 2018-03-19 (×2): via INTRAVENOUS

## 2018-03-19 MED ORDER — MIDAZOLAM HCL 2 MG/2ML IJ SOLN
INTRAMUSCULAR | Status: AC
  Administered 2018-03-19: 1 mg
  Filled 2018-03-19: qty 2

## 2018-03-19 MED ORDER — MAGNESIUM CITRATE PO SOLN: 1.0000 | Freq: Once | ORAL | Status: DC | PRN

## 2018-03-19 MED ORDER — BUPIVACAINE IN DEXTROSE 0.75-8.25 % IT SOLN
INTRATHECAL | Status: DC | PRN
  Administered 2018-03-19: 1.6 mL via INTRATHECAL

## 2018-03-19 MED ORDER — ACETAMINOPHEN 325 MG PO TABS: 325.0000 mg | ORAL_TABLET | Freq: Four times a day (QID) | ORAL | Status: DC | PRN

## 2018-03-19 MED ORDER — PROPOFOL 500 MG/50ML IV EMUL
INTRAVENOUS | Status: DC | PRN
  Administered 2018-03-19: 100 ug/kg/min via INTRAVENOUS

## 2018-03-19 MED ORDER — FENTANYL CITRATE (PF) 100 MCG/2ML IJ SOLN: 25.0000 ug | INTRAMUSCULAR | Status: DC | PRN

## 2018-03-19 MED ORDER — SODIUM CHLORIDE (PF) 0.9 % IJ SOLN
INTRAMUSCULAR | Status: AC
  Filled 2018-03-19: qty 50

## 2018-03-19 MED ORDER — SODIUM CHLORIDE 0.9% FLUSH
INTRAVENOUS | Status: DC | PRN
  Administered 2018-03-19: 50 mL

## 2018-03-19 MED ORDER — MORPHINE SULFATE (PF) 2 MG/ML IV SOLN: 0.5000 mg | INTRAVENOUS | Status: DC | PRN

## 2018-03-19 MED ORDER — POLYETHYLENE GLYCOL 3350 17 G PO PACK: 17.0000 g | PACK | Freq: Every day | ORAL | Status: DC | PRN

## 2018-03-19 MED ORDER — AMLODIPINE BESYLATE 10 MG PO TABS
10.0000 mg | ORAL_TABLET | Freq: Every day | ORAL | Status: DC
  Administered 2018-03-19: 10 mg via ORAL
  Filled 2018-03-19 (×2): qty 1

## 2018-03-19 MED ORDER — PHENYLEPHRINE 40 MCG/ML (10ML) SYRINGE FOR IV PUSH (FOR BLOOD PRESSURE SUPPORT)
PREFILLED_SYRINGE | INTRAVENOUS | Status: AC
  Filled 2018-03-19: qty 10

## 2018-03-19 MED ORDER — TIZANIDINE HCL 2 MG PO TABS: 2.0000 mg | ORAL_TABLET | Freq: Three times a day (TID) | ORAL | 0 refills | Status: DC | PRN

## 2018-03-19 MED ORDER — METHOCARBAMOL 500 MG PO TABS: 500.0000 mg | ORAL_TABLET | Freq: Four times a day (QID) | ORAL | Status: DC | PRN

## 2018-03-19 MED ORDER — GABAPENTIN 300 MG PO CAPS
300.0000 mg | ORAL_CAPSULE | Freq: Two times a day (BID) | ORAL | Status: DC
  Administered 2018-03-19 – 2018-03-20 (×2): 300 mg via ORAL
  Filled 2018-03-19 (×2): qty 1

## 2018-03-19 MED ORDER — HYDROCHLOROTHIAZIDE 25 MG PO TABS
25.0000 mg | ORAL_TABLET | Freq: Every day | ORAL | Status: DC
  Administered 2018-03-20: 25 mg via ORAL
  Filled 2018-03-19: qty 1

## 2018-03-19 MED ORDER — METHOCARBAMOL 500 MG IVPB - SIMPLE MED
500.0000 mg | Freq: Four times a day (QID) | INTRAVENOUS | Status: DC | PRN
  Filled 2018-03-19 (×2): qty 50

## 2018-03-19 MED ORDER — PHENYLEPHRINE 40 MCG/ML (10ML) SYRINGE FOR IV PUSH (FOR BLOOD PRESSURE SUPPORT)
PREFILLED_SYRINGE | INTRAVENOUS | Status: DC | PRN
  Administered 2018-03-19 (×15): 80 ug via INTRAVENOUS

## 2018-03-19 MED ORDER — PHENYLEPHRINE 40 MCG/ML (10ML) SYRINGE FOR IV PUSH (FOR BLOOD PRESSURE SUPPORT)
PREFILLED_SYRINGE | INTRAVENOUS | Status: AC
  Filled 2018-03-19: qty 20

## 2018-03-19 MED ORDER — ONDANSETRON HCL 4 MG/2ML IJ SOLN: 4.0000 mg | Freq: Four times a day (QID) | INTRAMUSCULAR | Status: DC | PRN

## 2018-03-19 MED ORDER — ALUM & MAG HYDROXIDE-SIMETH 200-200-20 MG/5ML PO SUSP: 30.0000 mL | ORAL | Status: DC | PRN

## 2018-03-19 MED ORDER — WATER FOR IRRIGATION, STERILE IR SOLN
Status: DC | PRN
  Administered 2018-03-19: 2000 mL

## 2018-03-19 MED ORDER — HYDROCODONE-ACETAMINOPHEN 5-325 MG PO TABS
1.0000 | ORAL_TABLET | ORAL | Status: DC | PRN
  Administered 2018-03-19 – 2018-03-20 (×3): 1 via ORAL
  Administered 2018-03-20: 2 via ORAL
  Filled 2018-03-19: qty 2
  Filled 2018-03-19 (×2): qty 1
  Filled 2018-03-19: qty 2

## 2018-03-19 MED ORDER — BUPIVACAINE HCL (PF) 0.5 % IJ SOLN
INTRAMUSCULAR | Status: AC
  Filled 2018-03-19: qty 30

## 2018-03-19 MED ORDER — PRAVASTATIN SODIUM 20 MG PO TABS
40.0000 mg | ORAL_TABLET | Freq: Every evening | ORAL | Status: DC
  Administered 2018-03-19: 40 mg via ORAL
  Filled 2018-03-19: qty 2

## 2018-03-19 MED ORDER — LORATADINE 10 MG PO TABS
10.0000 mg | ORAL_TABLET | Freq: Every day | ORAL | Status: DC
  Administered 2018-03-19 – 2018-03-20 (×2): 10 mg via ORAL
  Filled 2018-03-19 (×2): qty 1

## 2018-03-19 SURGICAL SUPPLY — 60 items
ATTUNE MED DOME PAT 38 KNEE (Knees) ×2 IMPLANT
ATTUNE PS FEM LT SZ 4 CEM KNEE (Femur) ×2 IMPLANT
ATTUNE PSRP INSR SZ4 7 KNEE (Insert) ×2 IMPLANT
BAG DECANTER FOR FLEXI CONT (MISCELLANEOUS) ×2 IMPLANT
BAG ZIPLOCK 12X15 (MISCELLANEOUS) ×2 IMPLANT
BANDAGE ACE 6X5 VEL STRL LF (GAUZE/BANDAGES/DRESSINGS) ×2 IMPLANT
BASE TIBIA ATTUNE KNEE SYS SZ6 (Knees) ×1 IMPLANT
BENZOIN TINCTURE PRP APPL 2/3 (GAUZE/BANDAGES/DRESSINGS) ×2 IMPLANT
BLADE SAGITTAL 25.0X1.19X90 (BLADE) ×2 IMPLANT
BLADE SAW SGTL 11.0X1.19X90.0M (BLADE) ×2 IMPLANT
BLADE SURG SZ10 CARB STEEL (BLADE) ×4 IMPLANT
BOOTIES KNEE HIGH SLOAN (MISCELLANEOUS) ×2 IMPLANT
BOWL SMART MIX CTS (DISPOSABLE) ×2 IMPLANT
CEMENT HV SMART SET (Cement) ×4 IMPLANT
COVER SURGICAL LIGHT HANDLE (MISCELLANEOUS) ×2 IMPLANT
COVER WAND RF STERILE (DRAPES) ×2 IMPLANT
CUFF TOURN SGL QUICK 34 (TOURNIQUET CUFF) ×1
CUFF TRNQT CYL 34X4X40X1 (TOURNIQUET CUFF) ×1 IMPLANT
DECANTER SPIKE VIAL GLASS SM (MISCELLANEOUS) ×2 IMPLANT
DRAPE U-SHAPE 47X51 STRL (DRAPES) ×2 IMPLANT
DRSG AQUACEL AG ADV 3.5X10 (GAUZE/BANDAGES/DRESSINGS) ×2 IMPLANT
DURAPREP 26ML APPLICATOR (WOUND CARE) ×2 IMPLANT
ELECT REM PT RETURN 15FT ADLT (MISCELLANEOUS) ×2 IMPLANT
GLOVE BIOGEL PI IND STRL 6.5 (GLOVE) ×1 IMPLANT
GLOVE BIOGEL PI IND STRL 7.5 (GLOVE) ×1 IMPLANT
GLOVE BIOGEL PI IND STRL 8 (GLOVE) ×3 IMPLANT
GLOVE BIOGEL PI INDICATOR 6.5 (GLOVE) ×1
GLOVE BIOGEL PI INDICATOR 7.5 (GLOVE) ×1
GLOVE BIOGEL PI INDICATOR 8 (GLOVE) ×3
GLOVE ECLIPSE 7.5 STRL STRAW (GLOVE) ×4 IMPLANT
GLOVE SURG SS PI 6.5 STRL IVOR (GLOVE) ×2 IMPLANT
GLOVE SURG SS PI 7.5 STRL IVOR (GLOVE) ×2 IMPLANT
GOWN STRL REUS W/TWL LRG LVL4 (GOWN DISPOSABLE) ×2 IMPLANT
GOWN STRL REUS W/TWL XL LVL3 (GOWN DISPOSABLE) ×6 IMPLANT
HANDPIECE INTERPULSE COAX TIP (DISPOSABLE) ×1
HOLDER FOLEY CATH W/STRAP (MISCELLANEOUS) ×2 IMPLANT
HOOD PEEL AWAY FLYTE STAYCOOL (MISCELLANEOUS) ×8 IMPLANT
IMMOBILIZER KNEE 20 (SOFTGOODS) ×2
IMMOBILIZER KNEE 20 THIGH 36 (SOFTGOODS) ×1 IMPLANT
MANIFOLD NEPTUNE II (INSTRUMENTS) ×2 IMPLANT
NEEDLE HYPO 22GX1.5 SAFETY (NEEDLE) ×2 IMPLANT
NS IRRIG 1000ML POUR BTL (IV SOLUTION) ×2 IMPLANT
PACK ICE MAXI GEL EZY WRAP (MISCELLANEOUS) ×2 IMPLANT
PACK TOTAL KNEE CUSTOM (KITS) ×2 IMPLANT
PADDING CAST COTTON 6X4 STRL (CAST SUPPLIES) ×2 IMPLANT
PIN STEINMAN FIXATION KNEE (PIN) ×2 IMPLANT
PROTECTOR NERVE ULNAR (MISCELLANEOUS) ×2 IMPLANT
SET HNDPC FAN SPRY TIP SCT (DISPOSABLE) ×1 IMPLANT
STRIP CLOSURE SKIN 1/2X4 (GAUZE/BANDAGES/DRESSINGS) ×2 IMPLANT
SUT MNCRL AB 3-0 PS2 18 (SUTURE) ×2 IMPLANT
SUT VIC AB 0 CT1 36 (SUTURE) ×2 IMPLANT
SUT VIC AB 1 CT1 36 (SUTURE) ×4 IMPLANT
SUT VIC AB 2-0 CT1 27 (SUTURE) ×1
SUT VIC AB 2-0 CT1 TAPERPNT 27 (SUTURE) ×1 IMPLANT
SYR CONTROL 10ML LL (SYRINGE) ×4 IMPLANT
TIBIA ATTUNE KNEE SYS BASE SZ6 (Knees) ×2 IMPLANT
TRAY FOLEY MTR SLVR 16FR STAT (SET/KITS/TRAYS/PACK) ×2 IMPLANT
WATER STERILE IRR 1000ML POUR (IV SOLUTION) ×4 IMPLANT
WRAP KNEE MAXI GEL POST OP (GAUZE/BANDAGES/DRESSINGS) ×2 IMPLANT
YANKAUER SUCT BULB TIP 10FT TU (MISCELLANEOUS) ×2 IMPLANT

## 2018-03-19 NOTE — Anesthesia Postprocedure Evaluation (Signed)
Anesthesia Post Note  Patient: Patrick Vargas  Procedure(s) Performed: TOTAL KNEE ARTHROPLASTY (Left Knee)     Patient location during evaluation: PACU Anesthesia Type: Spinal Level of consciousness: oriented and awake and alert Pain management: pain level controlled Vital Signs Assessment: post-procedure vital signs reviewed and stable Respiratory status: spontaneous breathing, respiratory function stable and nonlabored ventilation Cardiovascular status: blood pressure returned to baseline and stable Postop Assessment: no headache, no backache, no apparent nausea or vomiting, patient able to bend at knees and spinal receding Anesthetic complications: no    Last Vitals:  Vitals:   03/19/18 1645 03/19/18 1700  BP: 119/83 130/73  Pulse: 84 80  Resp: 15 16  Temp:    SpO2: 96% 94%    Last Pain:  Vitals:   03/19/18 1645  TempSrc:   PainSc: 0-No pain                 Twylia Oka A.

## 2018-03-19 NOTE — Anesthesia Procedure Notes (Addendum)
Spinal  Patient location during procedure: OR End time: 03/19/2018 2:25 PM Staffing Resident/CRNA: Caryl Pina T, CRNA Performed: resident/CRNA  Preanesthetic Checklist Completed: patient identified, site marked, surgical consent, pre-op evaluation, timeout performed, IV checked, risks and benefits discussed and monitors and equipment checked Spinal Block Patient position: sitting Prep: DuraPrep Patient monitoring: blood pressure, continuous pulse ox and heart rate Approach: midline Location: L3-4 Injection technique: single-shot Needle Needle type: Pencan  Needle gauge: 24 G Needle length: 9 cm Assessment Sensory level: T6 Additional Notes Expiration date of kit checked and confirmed. Patient tolerated procedure well, without complications.

## 2018-03-19 NOTE — Anesthesia Procedure Notes (Signed)
Anesthesia Regional Block: Adductor canal block   Pre-Anesthetic Checklist: ,, timeout performed, Correct Patient, Correct Site, Correct Laterality, Correct Procedure, Correct Position, site marked, Risks and benefits discussed, pre-op evaluation,  At surgeon's request and post-op pain management  Laterality: Left  Prep: Maximum Sterile Barrier Precautions used, chloraprep       Needles:  Injection technique: Single-shot  Needle Type: Echogenic Stimulator Needle     Needle Length: 9cm  Needle Gauge: 22     Additional Needles:   Procedures:,,,, ultrasound used (permanent image in chart),,,,  Narrative:  Start time: 03/19/2018 1:45 PM End time: 03/19/2018 1:48 PM Injection made incrementally with aspirations every 5 mL.  Performed by: Personally  Anesthesiologist: Brennan Bailey, MD  Additional Notes: Risks, benefits, and alternative discussed. Patient gave consent for procedure. Patient prepped and draped in sterile fashion. Sedation administered, patient remains easily responsive to voice. Relevant anatomy identified with ultrasound guidance. Local anesthetic given in 5cc increments with no signs or symptoms of intravascular injection. No pain or paraesthesias with injection. Patient monitored throughout procedure with signs of LAST or immediate complications. Tolerated well. Ultrasound image placed in chart.  Tawny Asal, MD

## 2018-03-19 NOTE — Discharge Instructions (Signed)

## 2018-03-19 NOTE — Transfer of Care (Signed)
Immediate Anesthesia Transfer of Care Note  Patient: Patrick Vargas  Procedure(s) Performed: TOTAL KNEE ARTHROPLASTY (Left Knee)  Patient Location: PACU  Anesthesia Type:Spinal  Level of Consciousness: awake  Airway & Oxygen Therapy: Patient Spontanous Breathing and Patient connected to face mask oxygen  Post-op Assessment: Report given to RN and Post -op Vital signs reviewed and stable  Post vital signs: Reviewed and stable  Last Vitals:  Vitals Value Taken Time  BP 148/114 03/19/2018  4:18 PM  Temp    Pulse 85 03/19/2018  4:21 PM  Resp 11 03/19/2018  4:21 PM  SpO2 96 % 03/19/2018  4:21 PM  Vitals shown include unvalidated device data.  Last Pain:  Vitals:   03/19/18 1405  TempSrc:   PainSc: 0-No pain      Patients Stated Pain Goal: 5 (82/57/49 3552)  Complications: No apparent anesthesia complications

## 2018-03-19 NOTE — Brief Op Note (Signed)
03/19/2018  3:44 PM  PATIENT:  Patrick Vargas  78 y.o. male  PRE-OPERATIVE DIAGNOSIS:  LEFT KNEE OSTEOARTHRITIS  POST-OPERATIVE DIAGNOSIS:  LEFT KNEE OSTEOARTHRITIS  PROCEDURE:  Procedure(s): TOTAL KNEE ARTHROPLASTY (Left)  SURGEON:  Surgeon(s) and Role:    Dorna Leitz, MD - Primary  PHYSICIAN ASSISTANT:   ASSISTANTS: jim bethune   ANESTHESIA:   spinal  EBL:  minimal   BLOOD ADMINISTERED:none  DRAINS: none   LOCAL MEDICATIONS USED:  MARCAINE    and OTHER experel  SPECIMEN:  No Specimen  DISPOSITION OF SPECIMEN:  N/A  COUNTS:  YES  TOURNIQUET:   Total Tourniquet Time Documented: Thigh (Left) - 48 minutes Total: Thigh (Left) - 48 minutes   DICTATION: .Other Dictation: Dictation Number [id number]  PLAN OF CARE: Admit for overnight observation  PATIENT DISPOSITION:  PACU - hemodynamically stable.   Delay start of Pharmacological VTE agent (>24hrs) due to surgical blood loss or risk of bleeding: no

## 2018-03-19 NOTE — Progress Notes (Signed)
Assisted Dr. Wilford Grist with left, ultrasound guided, adductor canal block. Side rails up, monitors on throughout procedure. See vital signs in flow sheet. Tolerated Procedure well.

## 2018-03-19 NOTE — Evaluation (Signed)
Physical Therapy Evaluation Patient Details Name: Patrick Vargas MRN: [id number] DOB: September 20, 1940 Today's Date: 03/19/2018   History of Present Illness  78 yo male s/p L TKR on 03/19/18. PMH includes carotid artery stenosis, HTN, HLD, PVCs, GERD, PVD, L carotid artery angioplasty, R TKR.   Clinical Impression   Pt presents with L knee pain, decreased L knee ROM, difficulty performing bed mobility, increased time and effort to perform mobility tasks, and decreased activity tolerance due to L knee pain. Pt to benefit from acute PT to address deficits. Pt ambulated 60 ft with RW with min guard assist, verbal cuing provided throughout. Pt educated on ankle pumps (20/hour) to perform this afternoon/evening to increase circulation, to pt's tolerance and limited by pain. PT to progress mobility as tolerated, and will continue to follow acutely.        Follow Up Recommendations Follow surgeon's recommendation for DC plan and follow-up therapies;Supervision for mobility/OOB    Equipment Recommendations  None recommended by PT    Recommendations for Other Services       Precautions / Restrictions Precautions Precautions: Fall Restrictions Weight Bearing Restrictions: No Other Position/Activity Restrictions: WBAT       Mobility  Bed Mobility Overal bed mobility: Needs Assistance Bed Mobility: Supine to Sit     Supine to sit: Min assist;HOB elevated     General bed mobility comments: Min assist for LLE management and slow lowering to ground once at EOB. Increased time and effort to perform. Pt with lightheadedness when sitting EOB, passed quickly and BP/HR WNL when NT checked during PT eval.  Transfers Overall transfer level: Needs assistance Equipment used: Rolling walker (2 wheeled) Transfers: Sit to/from Stand Sit to Stand: Min guard;From elevated surface         General transfer comment: Min guard for safety. Verbal cuing for hand placement when rising to stand. no instability in  LLE noted during wieght shifts at EOB.   Ambulation/Gait Ambulation/Gait assistance: Min guard Gait Distance (Feet): 60 Feet Assistive device: Rolling walker (2 wheeled) Gait Pattern/deviations: Step-to pattern;Decreased weight shift to left;Antalgic Gait velocity: decr    General Gait Details: Min guard for safety. Verbal cuing for sequencing, placement in RW, turning.  Stairs            Wheelchair Mobility    Modified Rankin (Stroke Patients Only)       Balance Overall balance assessment: Mild deficits observed, not formally tested                                           Pertinent Vitals/Pain Pain Assessment: 0-10 Pain Score: 3  Pain Location: L knee  Pain Descriptors / Indicators: Sore;Discomfort Pain Intervention(s): Limited activity within patient's tolerance;Repositioned;Monitored during session    Home Living Family/patient expects to be discharged to:: Private residence Living Arrangements: Spouse/significant other Available Help at Discharge: Family;Available PRN/intermittently Type of Home: House Home Access: Stairs to enter Entrance Stairs-Rails: None Entrance Stairs-Number of Steps: 1 Home Layout: One level Home Equipment: Shower seat;Shower seat - built in;Bedside commode;Walker - 2 wheels;Cane - single point      Prior Function Level of Independence: Independent               Hand Dominance   Dominant Hand: Right    Extremity/Trunk Assessment   Upper Extremity Assessment Upper Extremity Assessment: Overall WFL for tasks assessed    Lower  Extremity Assessment Lower Extremity Assessment: Overall WFL for tasks assessed;LLE deficits/detail LLE Deficits / Details: suspected post-surgical weakness; able to perform ankle pumps, heel slides to 90*, quad set, SLR without assist and without quad lag  LLE Sensation: WNL    Cervical / Trunk Assessment Cervical / Trunk Assessment: Normal  Communication   Communication:  No difficulties  Cognition Arousal/Alertness: Awake/alert Behavior During Therapy: WFL for tasks assessed/performed Overall Cognitive Status: Within Functional Limits for tasks assessed                                        General Comments      Exercises Total Joint Exercises Goniometric ROM: knee aarom ~5-90*, limited by pain    Assessment/Plan    PT Assessment Patient needs continued PT services  PT Problem List Decreased strength;Pain;Decreased range of motion;Decreased activity tolerance;Decreased knowledge of use of DME;Decreased balance;Decreased mobility       PT Treatment Interventions DME instruction;Therapeutic activities;Gait training;Therapeutic exercise;Patient/family education;Stair training;Balance training;Functional mobility training    PT Goals (Current goals can be found in the Care Plan section)  Acute Rehab PT Goals Patient Stated Goal: none stated  PT Goal Formulation: With patient Time For Goal Achievement: 03/26/18 Potential to Achieve Goals: Good    Frequency 7X/week   Barriers to discharge        Co-evaluation               AM-PAC PT "6 Clicks" Mobility  Outcome Measure Help needed turning from your back to your side while in a flat bed without using bedrails?: A Little Help needed moving from lying on your back to sitting on the side of a flat bed without using bedrails?: A Little Help needed moving to and from a bed to a chair (including a wheelchair)?: A Little Help needed standing up from a chair using your arms (e.g., wheelchair or bedside chair)?: A Little Help needed to walk in hospital room?: A Little Help needed climbing 3-5 steps with a railing? : A Little 6 Click Score: 18    End of Session Equipment Utilized During Treatment: Gait belt Activity Tolerance: Patient tolerated treatment well;Patient limited by pain Patient left: with call bell/phone within reach;in bed;with bed alarm set;with SCD's  reapplied Nurse Communication: Mobility status PT Visit Diagnosis: Other abnormalities of gait and mobility (R26.89);Difficulty in walking, not elsewhere classified (R26.2)    Time: 8119-1478 PT Time Calculation (min) (ACUTE ONLY): 22 min   Charges:   PT Evaluation $PT Eval Low Complexity: 1 Low         Patrick Vargas Chancy, PT Acute Rehabilitation Services Pager [phone]  Office [phone]  Ruchy Wildrick D Elonda Husky 03/19/2018, 8:01 PM

## 2018-03-19 NOTE — H&P (Signed)
TOTAL KNEE ADMISSION H&P  Patient is being admitted for left total knee arthroplasty.  Subjective:  Chief Complaint:left knee pain.  HPI: Patrick Vargas, 78 y.o. male, has a history of pain and functional disability in the left knee due to arthritis and has failed non-surgical conservative treatments for greater than 12 weeks to includeNSAID's and/or analgesics, corticosteriod injections, viscosupplementation injections and activity modification.  Onset of symptoms was gradual, starting 5 years ago with gradually worsening course since that time. The patient noted no past surgery on the left knee(s).  Patient currently rates pain in the left knee(s) at 9 out of 10 with activity. Patient has night pain, worsening of pain with activity and weight bearing, pain that interferes with activities of daily living, pain with passive range of motion and joint swelling.  Patient has evidence of subchondral cysts, subchondral sclerosis and joint space narrowing by imaging studies. This patient has had Failure of all reasonable conservative care.  The patient has a slight low-grade temperature.  He has had some drainage in the back of his throat over the last 72 hours.  We started him on oral antibiotics yesterday.  I am going to have anesthesia listen to his lungs and if they are clear and the CBC which we are going to get stat today shows no elevated white blood cell count we will plan on proceeding with surgery.  If any of those things show any level of concern then we will hold off.  Patient Active Problem List   Diagnosis Date Noted  . Carotid artery stenosis 04/12/2017  . Carotid artery stenosis, symptomatic, right 03/06/2017  . Preoperative cardiovascular examination 11/19/2015  . Hypertension 09/03/2012  . Hyperlipidemia 09/03/2012  . Overweight (BMI 25.0-29.9) 09/03/2012  . Statin intolerance 09/03/2012  . 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 Arthritis   . Carotid  artery occlusion   . Dyslipidemia   . GERD (gastroesophageal reflux disease)   . Headache    ocular migraines  . History of kidney stones   . Peripheral vascular disease ([place])    Carotid stenosis right   . PVC's (premature ventricular contractions)   . Systemic hypertension   . 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 BUNIONECTOMY  1994   Left foot  . CAROTID ENDARTERECTOMY    . CARPAL TUNNEL RELEASE  1999   Left hand  . ENDARTERECTOMY Right 03/06/2017   Procedure: ENDARTERECTOMY CAROTID RIGHT;  Surgeon: Elam Dutch, MD;  Location: [place];  Service: Vascular;  Laterality: Right;  . ENDARTERECTOMY Left 04/12/2017   Procedure: ENDARTERECTOMY CAROTID LEFT;  Surgeon: Elam Dutch, MD;  Location: [place];  Service: Vascular;  Laterality: Left;  . PATCH ANGIOPLASTY Left 04/12/2017   Procedure: PATCH ANGIOPLASTY LEFT CAROTID ARTERY;  Surgeon: Elam Dutch, MD;  Location: [facility];  Service: Vascular;  Laterality: Left;  . TENDON REPAIR Right    right foot  . TONSILLECTOMY    . TOTAL KNEE ARTHROPLASTY  12/04/2010   Right    Current Facility-Administered Medications  Medication Dose Route Frequency Provider Last Rate Last Dose  . bupivacaine liposome (EXPAREL) 1.3 % injection 266 mg  20 mL Infiltration Once Dorna Leitz, MD      . chlorhexidine (HIBICLENS) 4 % liquid 4 application  60 mL Topical Once Dorna Leitz, MD      . fentaNYL (SUBLIMAZE) 100 MCG/2ML injection           .  fentaNYL (SUBLIMAZE) injection 50-100 mcg  50-100 mcg Intravenous UD Howze, Leanord Hawking, MD      . lactated ringers infusion   Intravenous Continuous Brennan Bailey, MD      . midazolam (VERSED) 2 MG/2ML injection           . midazolam (VERSED) injection 1-2 mg  1-2 mg Intravenous UD Howze, Leanord Hawking, MD      . tranexamic acid (CYKLOKAPRON) 2,000 mg in sodium chloride 0.9 % 50 mL Topical Application  0,263 mg Topical To OR Tyree Fluharty, MD      .  tranexamic acid (CYKLOKAPRON) IVPB 1,000 mg  1,000 mg Intravenous To OR Simranjit Thayer, MD      . vancomycin (VANCOCIN) IVPB 1000 mg/200 mL premix  1,000 mg Intravenous On Call to Alianza, MD       Allergies  Allergen Reactions  . Amoxicillin Nausea Only and Other (See Comments)    Has patient had a PCN reaction causing immediate rash, facial/tongue/throat swelling, SOB or lightheadedness with hypotension: No Has patient had a PCN reaction causing severe rash involving mucus membranes or skin necrosis: No Has patient had a PCN reaction that required hospitalization: No Has patient had a PCN reaction occurring within the last 10 years: No If all of the above answers are "NO", then may proceed with Cephalosporin use.   Marland Kitchen Oxycodone Nausea Only  . Statins Other (See Comments)    Depression     Social History   Tobacco Use  . Smoking status: Never Smoker  . Smokeless tobacco: Never Used  Substance Use Topics  . Alcohol use: Yes    Alcohol/week: 5.0 standard drinks    Types: 5 Standard drinks or equivalent per week    Family History  Problem Relation Age of Onset  . Peripheral Artery Disease Mother   . Stroke Father      ROS ROS: I have reviewed the patient's review of systems thoroughly and there are no positive responses as relates to the HPI. Objective:  Physical Exam  Vital signs in last 24 hours: Temp:  [99.4 F (37.4 C)] 99.4 F (37.4 C) (02/03 1146) Pulse Rate:  [90] 90 (02/03 1146) Resp:  [10] 10 (02/03 1146) BP: (146)/(81) 146/81 (02/03 1146) SpO2:  [98 %] 98 % (02/03 1146) Well-developed well-nourished patient in no acute distress. Alert and oriented x3 HEENT:within normal limits Cardiac: Regular rate and rhythm Pulmonary: Lungs clear to auscultation Abdomen: Soft and nontender.  Normal active bowel sounds  Musculoskeletal: Left knee: Painful range of motion.  Limited range of motion.  No instability.  Trace effusion.  Mild varus  malalignment. Labs: Recent Results (from the past 2160 hour(s))  Lipid panel     Status: Abnormal   Collection Time: 12/22/17  8:25 AM  Result Value Ref Range   Cholesterol, Total 165 100 - 199 mg/dL   Triglycerides 180 (H) 0 - 149 mg/dL   HDL 43 >39 mg/dL   VLDL Cholesterol Cal 36 5 - 40 mg/dL   LDL Calculated 86 0 - 99 mg/dL   Chol/HDL Ratio 3.8 0.0 - 5.0 ratio    Comment:                                   T. Chol/HDL Ratio  Men  Women                               1/2 Avg.Risk  3.4    3.3                                   Avg.Risk  5.0    4.4                                2X Avg.Risk  9.6    7.1                                3X Avg.Risk 23.4   11.0   Surgical pcr screen     Status: None   Collection Time: 03/12/18 10:45 AM  Result Value Ref Range   MRSA, PCR NEGATIVE NEGATIVE   Staphylococcus aureus NEGATIVE NEGATIVE    Comment: (NOTE) The Xpert SA Assay (FDA approved for NASAL specimens in patients 50 years of age and older), is one component of a comprehensive surveillance program. It is not intended to diagnose infection nor to guide or monitor treatment. Performed at [facility], [place] [address], [place], [place] [address]   ABO/Rh     Status: None   Collection Time: 03/12/18 10:57 AM  Result Value Ref Range   ABO/RH(D)      A POS Performed at [facility], [place] [address] [place], [place] [address]   APTT     Status: None   Collection Time: 03/12/18 10:58 AM  Result Value Ref Range   aPTT 35 24 - 36 seconds    Comment: Performed at [facility], [place] [address] [place], [place] [address]  Basic metabolic panel     Status: Abnormal   Collection Time: 03/12/18 10:58 AM  Result Value Ref Range   Sodium 137 135 - 145 mmol/L   Potassium 3.7 3.5 - 5.1 mmol/L   Chloride 101 98 - 111 mmol/L   CO2 28 22 - 32 mmol/L   Glucose, Bld 76 70 - 99 mg/dL   BUN 34 (H) 8 - 23  mg/dL   Creatinine, Ser 1.38 (H) 0.61 - 1.24 mg/dL   Calcium 9.3 8.9 - 10.3 mg/dL   GFR calc non Af Amer 50 (L) >60 mL/min   GFR calc Af Amer 58 (L) >60 mL/min   Anion gap 8 5 - 15    Comment: Performed at [place] [address], [place], [place] [address]  CBC WITH DIFFERENTIAL     Status: None   Collection Time: 03/12/18 10:58 AM  Result Value Ref Range   WBC 5.8 4.0 - 10.5 K/uL   RBC 5.06 4.22 - 5.81 MIL/uL   Hemoglobin 14.9 13.0 - 17.0 g/dL   HCT 45.5 39.0 - 52.0 %   MCV 89.9 80.0 - 100.0 fL   MCH 29.4 26.0 - 34.0 pg   MCHC 32.7 30.0 - 36.0 g/dL   RDW 13.3 11.5 - 15.5 %   Platelets 214 150 - 400 K/uL   nRBC 0.0 0.0 - 0.2 %   Neutrophils Relative % 54 %   Neutro Abs 3.2 1.7 - 7.7 K/uL   Lymphocytes Relative 31 %  Lymphs Abs 1.8 0.7 - 4.0 K/uL   Monocytes Relative 10 %   Monocytes Absolute 0.6 0.1 - 1.0 K/uL   Eosinophils Relative 4 %   Eosinophils Absolute 0.2 0.0 - 0.5 K/uL   Basophils Relative 1 %   Basophils Absolute 0.1 0.0 - 0.1 K/uL   Immature Granulocytes 0 %   Abs Immature Granulocytes 0.02 0.00 - 0.07 K/uL    Comment: Performed at [facility], [place] [address], [place], [place] [address]  Protime-INR     Status: None   Collection Time: 03/12/18 10:58 AM  Result Value Ref Range   Prothrombin Time 13.3 11.4 - 15.2 seconds   INR 1.02     Comment: Performed at [facility], [place] [address] [place], [place] [address]  Type and screen Order type and screen if day of surgery is less than 15 days from draw of preadmission visit or order morning of surgery if day of surgery is greater than 6 days from preadmission visit.     Status: None   Collection Time: 03/12/18 10:58 AM  Result Value Ref Range   ABO/RH(D) A POS    Antibody Screen NEG    Sample Expiration 03/22/2018    Extend sample reason      NO TRANSFUSIONS OR PREGNANCY IN THE PAST 3 MONTHS Performed at [facility], [place]  [address] [place], [place] [address]   Urinalysis, Routine w reflex microscopic     Status: None   Collection Time: 03/12/18 11:02 AM  Result Value Ref Range   Color, Urine YELLOW YELLOW   APPearance CLEAR CLEAR   Specific Gravity, Urine 1.017 1.005 - 1.030   pH 5.0 5.0 - 8.0   Glucose, UA NEGATIVE NEGATIVE mg/dL   Hgb urine dipstick NEGATIVE NEGATIVE   Bilirubin Urine NEGATIVE NEGATIVE   Ketones, ur NEGATIVE NEGATIVE mg/dL   Protein, ur NEGATIVE NEGATIVE mg/dL   Nitrite NEGATIVE NEGATIVE   Leukocytes, UA NEGATIVE NEGATIVE    Comment: Performed at [place] [address] [place], [place] [address]    Estimated body mass index is 27.12 kg/m as calculated from the following:   Height as of 11/09/17: 5\' 10"  (1.778 m).   Weight as of 11/09/17: 85.7 kg.   Imaging Review Plain radiographs demonstrate severe degenerative joint disease of the left knee(s). The overall alignment ismild varus. The bone quality appears to be fair for age and reported activity level.   Preoperative templating of the joint replacement has been completed, documented, and submitted to the Operating Room personnel in order to optimize intra-operative equipment management.    Patient's anticipated LOS is less than 2 midnights, meeting these requirements: - Younger than 34 - Lives within 1 hour of care - Has a competent adult at home to recover with post-op recover - NO history of  - Chronic pain requiring opiods  - Diabetes  - Coronary Artery Disease  - Heart failure  - Heart attack  - Stroke  - DVT/VTE  - Cardiac arrhythmia  - Respiratory Failure/COPD  - Renal failure  - Anemia  - Advanced Liver disease        Assessment/Plan:  End stage arthritis, left knee   The patient history, physical examination, clinical judgment of the provider and imaging studies are consistent with end stage degenerative joint disease of the left knee(s) and total knee arthroplasty is  deemed medically necessary. The treatment options including medical management, injection therapy arthroscopy and arthroplasty  were discussed at length. The risks and benefits of total knee arthroplasty were presented and reviewed. The risks due to aseptic loosening, infection, stiffness, patella tracking problems, thromboembolic complications and other imponderables were discussed. The patient acknowledged the explanation, agreed to proceed with the plan and consent was signed. Patient is being admitted for inpatient treatment for surgery, pain control, PT, OT, prophylactic antibiotics, VTE prophylaxis, progressive ambulation and ADL's and discharge planning. The patient is planning to be discharged home with home health services

## 2018-03-19 NOTE — Op Note (Signed)
NAME: Patrick Vargas, Patrick Vargas MEDICAL RECORD IO:[id number] ACCOUNT [id number] DATE OF BIRTH:08-30-1940 FACILITY: [facility] LOCATION: [facility] PHYSICIAN:Daltin Crist Maudie Mercury, MD  OPERATIVE REPORT  DATE OF PROCEDURE:  03/19/2018  PREOPERATIVE DIAGNOSIS:  End-stage degenerative joint disease, left knee.  POSTOPERATIVE DIAGNOSIS:  End-stage degenerative joint disease, left knee.  PROCEDURE:  Left total knee replacement with an Attune system, size 4 femur, size 6 tibia, 7 mm bridging bearing, and a 38 mm all polyethylene patella.  SURGEON:  Dorna Leitz, MD  ASSISTANT:  Gaspar Skeeters PA-C, was present for the entire case and assisted by bone, retraction, saw cuts, and closing to minimize OR time.  BRIEF HISTORY:  The patient is a 78 year old male with a long history of significant complaints of left knee pain that has been treated conservatively for a prolonged period of time.  After failure of all conservative care, he is taken to the operating  room for left total knee replacement.  He had a right total knee done about years ago and has done great with that and he came for this procedure.  DESCRIPTION OF PROCEDURE:  The patient was brought to the operating room after adequate anesthesia was obtained with a spinal anesthetic, the patient was placed supine on the operating table.  Left leg was prepped and draped in the usual sterile fashion.   Following this, the leg was exsanguinated, blood pressure tourniquet inflated to 300 mmHg.  Following this, a midline incision was made, subcutaneous tissue down to the extensor mechanism and a medial parapatellar arthrotomy was undertaken.  Following  this, attention was turned towards the left leg where the leg was exsanguinated, blood pressure tourniquet to 300 mmHg.  Midline incision was made, subcutaneous tunnel extensor mechanism.  Medial parapatellar arthrotomy was undertaken.  Following this,  medial and lateral meniscus were removed, retropatellar fat pad, synovium  on the anterior aspect of the femur, and the anterior and posterior cruciates.  Once this was done, attention was turned towards intramedullary pilot hole being drilled in the  femur and a 5 degree valgus inclination cut with  9 mm of distal bone.  Following this, attention was turned towards sizing the femur.  It sizes to a 4.  Anterior and posterior cuts were made chamfers and box.  Attention was then turned towards the  tibia.  It is cut perpendicular to its long axis with 30 degrees of posterior slope and it is then sized to a 6.  It was drilled and keeled.  Trials were put in place with a 6 mm poly, seemed a bit loose.  We went to a 7.  Seemed perfect in flexion and  extension.  Attention was turned to the patella.  Then 7.5 mm of bone were removed and the lugs were drilled for a 38 all poly patella.  Following this, the trial patella was put in place.  Knee put through a range of motion, excellent stability and  range of motion are achieved.  Following that, attention was turned towards removal of the trial components and the knee was then copiously and thoroughly lavaged, pulsatile lavage, irrigation and suctioned dry.  The final components were then cemented  into place, size 6 tibia, size 4 femur, a 7 mm bridging bearing trial was placed along with a 38 patella is placed and held with a clamp.  All excess bone cement was removed.  Cement allowed had completely hardened.  Once that is done, attention was  turned towards placing the final poly, which was placed.  The tourniquet was let down first and all bleeders controlled with electrocautery.  The final poly was opened and placed.  Excellent range of motion and stability were achieved.  The medial  parapatellar arthrotomy was closed with #1 Vicryl running, skin with 0 and 2-0 Vicryl and 3-0 Monocryl subcuticular.  Benzoin and Steri-Strips were applied.  Sterile compressive dressing was applied.   The patient was taken to recovery was noted to be in  satisfactory condition.  Estimated blood loss for the procedure is minimal.  AN/NUANCE  D:03/19/2018 T:03/19/2018 JOB:[id number]

## 2018-03-19 NOTE — Care Plan (Signed)
Ortho Bundle Case Management Note  Patient Details  Name: Patrick Vargas MRN: [id number] Date of Birth: 02/20/1940   Spoke with patient prior to surgery. He will discharge to home with family and HHPT. Has equipment other than CPM and it has been delivered by [employer]. He is aware and agreeable with plan.                   DME Arranged:  [facility] DME Agency:  [employer]  HH Arranged:  PT [place] Agency:  [facility]  Additional Comments: Please contact me with any questions of if this plan should need to change.  Ladell Heads,  [facility]  [phone] 03/19/2018, 11:54 AM

## 2018-03-19 NOTE — Plan of Care (Signed)

## 2018-03-20 ENCOUNTER — Encounter (HOSPITAL_COMMUNITY): Payer: Self-pay | Admitting: Orthopedic Surgery

## 2018-03-20 DIAGNOSIS — E785 Hyperlipidemia, unspecified: Secondary | ICD-10-CM | POA: Diagnosis not present

## 2018-03-20 DIAGNOSIS — I739 Peripheral vascular disease, unspecified: Secondary | ICD-10-CM | POA: Diagnosis not present

## 2018-03-20 DIAGNOSIS — I10 Essential (primary) hypertension: Secondary | ICD-10-CM | POA: Diagnosis not present

## 2018-03-20 DIAGNOSIS — Z880 Allergy status to penicillin: Secondary | ICD-10-CM | POA: Diagnosis not present

## 2018-03-20 DIAGNOSIS — Z79899 Other long term (current) drug therapy: Secondary | ICD-10-CM | POA: Diagnosis not present

## 2018-03-20 DIAGNOSIS — M1712 Unilateral primary osteoarthritis, left knee: Secondary | ICD-10-CM | POA: Diagnosis not present

## 2018-03-20 LAB — CBC
HCT: 38.2 % — ABNORMAL LOW (ref 39.0–52.0)
Hemoglobin: 12.2 g/dL — ABNORMAL LOW (ref 13.0–17.0)
MCH: 29.7 pg (ref 26.0–34.0)
MCHC: 31.9 g/dL (ref 30.0–36.0)
MCV: 92.9 fL (ref 80.0–100.0)
Platelets: 129 10*3/uL — ABNORMAL LOW (ref 150–400)
RBC: 4.11 MIL/uL — ABNORMAL LOW (ref 4.22–5.81)
RDW: 13.4 % (ref 11.5–15.5)
WBC: 7.4 10*3/uL (ref 4.0–10.5)
nRBC: 0 % (ref 0.0–0.2)

## 2018-03-20 NOTE — Discharge Summary (Signed)
Patient ID: Patrick Vargas MRN: [id number] DOB/AGE: [birth year] 76 y.o.  Admit date: 03/19/2018 Discharge date: 03/20/2018  Admission Diagnoses:  Principal Problem:   Primary osteoarthritis of left knee   Discharge Diagnoses:  Same  Past Medical History:  Diagnosis Date  . Arthritis   . Carotid artery occlusion   . Dyslipidemia   . GERD (gastroesophageal reflux disease)   . Headache    ocular migraines  . History of kidney stones   . Peripheral vascular disease ([place])    Carotid stenosis right   . PVC's (premature ventricular contractions)   . Systemic hypertension   . 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 male who was admitted 03/19/2018 for operative treatment ofPrimary osteoarthritis of left knee. Patient has severe unremitting pain that affects sleep, daily activities, and work/hobbies. After pre-op clearance the patient was taken to the operating room on 03/19/2018 and underwent  Procedure(s): Left TOTAL KNEE ARTHROPLASTY.    Patient was given perioperative antibiotics:  Anti-infectives (From admission, onward)   Start     Dose/Rate Route Frequency Ordered Stop   03/19/18 1830  ceFAZolin (ANCEF) IVPB 2g/100 mL premix     2 g 200 mL/hr over 30 Minutes Intravenous Every 6 hours 03/19/18 1733 03/20/18 0055   03/19/18 1200  vancomycin (VANCOCIN) IVPB 1000 mg/200 mL premix     1,000 mg 200 mL/hr over 60 Minutes Intravenous On call to O.R. 03/19/18 1147 03/19/18 1416       Patient was given sequential compression devices, early ambulation, and chemoprophylaxis to prevent DVT.  Patient benefited maximally from hospital stay and there were no complications.    Recent vital signs:  Patient Vitals for the past 24 hrs:  BP Temp Temp src Pulse Resp SpO2 Height Weight  03/20/18 0513 111/74 97.8 F (36.6 C) Oral  69 16 97 % - -  03/20/18 0112 117/72 97.9 F (36.6 C) Oral 69 16 98 % - -  03/19/18 2235 106/68 98.4 F (36.9 C) Oral 74 16 96 % - -  03/19/18 2031 125/73 98.5 F (36.9 C) Oral 76 16 95 % - -  03/19/18 1927 127/84 98.7 F (37.1 C) Oral 96 16 95 % - -  03/19/18 1835 (!) 153/93 98 F (36.7 C) Oral 81 16 100 % - -  03/19/18 1724 (!) 145/86 98.9 F (37.2 C) Oral 86 16 97 % - -  03/19/18 1715 - - - 80 - 99 % - -  03/19/18 1711 129/84 - - 77 14 95 % - -  03/19/18 1700 130/73 - - 80 16 94 % - -  03/19/18 1645 119/83 - - 84 15 96 % - -  03/19/18 1640 - - - 86 19 98 % - -  03/19/18 1630 138/81 - - 82 12 96 % - -  03/19/18 1618 129/72 99.3 F (37.4 C) - - 16 96 % - -  03/19/18 1410 140/79 - - 79 16 95 % - -  03/19/18 1405 (!) 143/81 - - 86 20 98 % - -  03/19/18 1400 136/72 - - 83 17 99 % - -  03/19/18 1355 130/80 - - 79 11 99 % - -  03/19/18 1350 136/80 - - 78 12 100 % - -  03/19/18 1345 (!) 155/88 - - 80 15 100 % - -  03/19/18 1342 (!) 165/91 98.7 F (37.1 C) Oral 86 15 99 % - -  03/19/18 1244 - - - - - - 5\' 9"  (1.753 m) 86.7 kg  03/19/18 1146 (!) 146/81 99.4 F (37.4 C) Oral 90 10 98 % - -     Recent laboratory studies:  Recent Labs    03/19/18 1214 03/20/18 0516  WBC 4.5 7.4  HGB 13.9 12.2*  HCT 41.2 38.2*  PLT 139* 129*     Discharge Medications:   Allergies as of 03/20/2018      Reactions   Amoxicillin Nausea Only, Other (See Comments)   Has patient had a PCN reaction causing immediate rash, facial/tongue/throat swelling, SOB or lightheadedness with hypotension: No Has patient had a PCN reaction causing severe rash involving mucus membranes or skin necrosis: No Has patient had a PCN reaction that required hospitalization: No Has patient had a PCN reaction occurring within the last 10 years: No If all of the above answers are "NO", then may proceed with Cephalosporin use.   Oxycodone Nausea Only   Statins Other (See Comments)   Depression      Medication List     STOP taking these medications   HYDROcodone-acetaminophen 5-325 MG tablet Commonly known as:  NORCO/VICODIN Replaced by:  HYDROcodone-acetaminophen 10-325 MG tablet     TAKE these medications   acetaminophen 500 MG tablet Commonly known as:  TYLENOL Take 1,000 mg by mouth every 6 (six) hours as needed for moderate pain or headache.   amLODipine 10 MG tablet Commonly known as:  NORVASC TAKE 1 TABLET BY MOUTH EVERY DAY   aspirin EC 325 MG tablet Take 1 tablet (325 mg total) by mouth 2 (two) times daily after a meal. Take x 1 month post op to decrease risk of blood clots. What changed:    medication strength  how much to take  when to take this  additional instructions   CELEBREX 200 MG capsule Generic drug:  celecoxib Take 200 mg by mouth daily.   cetirizine 10 MG tablet Commonly known as:  ZYRTEC Take 10 mg by mouth daily.   Coenzyme Q10 200 MG capsule Take 200 mg by mouth daily.   docusate sodium 100 MG capsule Commonly known as:  COLACE Take 1 capsule (100 mg total) by mouth 2 (two) times daily.   doxycycline 100 MG tablet Commonly known as:  VIBRA-TABS Take 100 mg by mouth 2 (two) times daily. 7 day course,   Evolocumab with Infusor 420 MG/3.5ML Soct Commonly known as:  REPATHA PUSHTRONEX SYSTEM Inject 420 mg into the skin every 30 (thirty) days.   hydrochlorothiazide 25 MG tablet Commonly known as:  HYDRODIURIL Take 1 tablet (25 mg total) by mouth daily.   HYDROcodone-acetaminophen 10-325 MG tablet Commonly known as:  NORCO Take 1 tablet by mouth every 4 (four) hours as needed for severe pain. Replaces:  HYDROcodone-acetaminophen 5-325 MG tablet   lisinopril 20 MG tablet Commonly known as:  PRINIVIL,ZESTRIL TAKE 1 TABLET BY MOUTH EVERY DAY What changed:  when to take this   pravastatin 40 MG tablet Commonly known as:  PRAVACHOL TAKE 1 TABLET BY MOUTH EVERY EVENING   tiZANidine 2 MG tablet Commonly known as:  ZANAFLEX Take 1 tablet (2 mg  total) by mouth every 8 (eight) hours as needed for muscle spasms.   traMADol 50 MG tablet Commonly known as:  ULTRAM Take 1 tablet (50 mg total) by mouth every 6 (six) hours as needed for moderate pain.  Discharge Care Instructions  (From admission, onward)         Start     Ordered   03/20/18 0000  Weight bearing as tolerated    Question Answer Comment  Laterality left   Extremity Lower      03/20/18 0803          Diagnostic Studies: Dg Chest 2 View  Result Date: 03/12/2018 CLINICAL DATA:  Preop knee arthroplasty EXAM: CHEST - 2 VIEW COMPARISON:  11/12/2010 FINDINGS: Scarring at the left base. Right lung clear. Heart is normal size. No effusions or acute bony abnormality. IMPRESSION: Left basilar scarring.  No active disease. Electronically Signed   By: Rolm Baptise M.D.   On: 03/12/2018 16:07    Disposition: Discharge disposition: 01-Home or Self Care       Discharge Instructions    CPM   Complete by:  As directed    Continuous passive motion machine (CPM):      Use the CPM from 0 to 80 degrees for 8 hours per day.      You may increase by 5-10 per day.  You may break it up into 2 or 3 sessions per day.      Use CPM for 1-2 weeks or until you are told to stop.   Call MD / Call 911   Complete by:  As directed    If you experience chest pain or shortness of breath, CALL 911 and be transported to the hospital emergency room.  If you develope a fever above 101 F, pus (white drainage) or increased drainage or redness at the wound, or calf pain, call your surgeon's office.   Diet general   Complete by:  As directed    Do not put a pillow under the knee. Place it under the heel.   Complete by:  As directed    Increase activity slowly as tolerated   Complete by:  As directed    Weight bearing as tolerated   Complete by:  As directed    Laterality:  left   Extremity:  Lower      Follow-up Information    Dorna Leitz, MD. Go on 03/29/2018.    Specialty:  Orthopedic Surgery Why:  your appointment has been set for 9:30 am  Contact information: [address] [place] [address] [phone]        [facility] Follow up.   Specialty:  [place] Why:  you will be seen by HHPT for 5 visits prior to MD follow up  Contact information: [place] [address] [phone]        [facility], [place]. Go on 03/29/2018.   Why:  You are scheduled to start Outpatient physical therapy at 11:00. Please go directly over after seeing the MD to do your paperwork Contact information: [facility] [place] [address] [phone]            Signed: Erlene Senters 03/20/2018, 8:03 AM

## 2018-03-20 NOTE — Progress Notes (Signed)
Subjective: 1 Day Post-Op Procedure(s) (LRB): TOTAL KNEE ARTHROPLASTY (Left) Patient reports pain as mild.  Taking by mouth and voiding okay.  Ambulated in hall yesterday evening.  Objective: Vital signs in last 24 hours: Temp:  [97.8 F (36.6 C)-99.4 F (37.4 C)] 97.8 F (36.6 C) (02/04 0513) Pulse Rate:  [69-96] 69 (02/04 0513) Resp:  [10-20] 16 (02/04 0513) BP: (106-165)/(68-93) 111/74 (02/04 0513) SpO2:  [94 %-100 %] 97 % (02/04 0513) Weight:  [86.7 kg] 86.7 kg (02/03 1244)  Intake/Output from previous day: 02/03 0701 - 02/04 0700 In: 4001.3 [P.O.:240; I.V.:3461.3; IV Piggyback:300] Out: 2800 [Urine:2775; Blood:25] Intake/Output this shift: No intake/output data recorded.  Recent Labs    03/19/18 1214 03/20/18 0516  HGB 13.9 12.2*   Recent Labs    03/19/18 1214 03/20/18 0516  WBC 4.5 7.4  RBC 4.59 4.11*  HCT 41.2 38.2*  PLT 139* 129*   No results for input(s): NA, K, CL, CO2, BUN, CREATININE, GLUCOSE, CALCIUM in the last 72 hours. No results for input(s): LABPT, INR in the last 72 hours. Left knee exam: Neurovascular intact Sensation intact distally Intact pulses distally Dorsiflexion/Plantar flexion intact Incision: dressing C/D/I Compartment soft   Patient's anticipated LOS is less than 2 midnights, meeting these requirements: - Lives within 1 hour of care - Has a competent adult at home to recover with post-op recover - NO history of  - Chronic pain requiring opiods  - Diabetes  - Coronary Artery Disease  - Heart failure  - Heart attack  - Stroke  - DVT/VTE  - Cardiac arrhythmia  - Respiratory Failure/COPD  - Renal failure  - Anemia  - Advanced Liver disease      Assessment/Plan: 1 Day Post-Op Procedure(s) (LRB): TOTAL KNEE ARTHROPLASTY (Left)  Plan: Aspirin 325 mg twice daily for DVT prophylaxis take x1 months postop. Weight-bear as tolerated on left lower extremity. Up with therapy Discharge home with home health  Follow-up with  Dr. Berenice Primas in 10 to 14 days.    Patrick Vargas 03/20/2018, 8:01 AM

## 2018-03-20 NOTE — Progress Notes (Addendum)
Physical Therapy Treatment Patient Details Name: Patrick Vargas MRN: [id number] DOB: 01-08-1941 Today's Date: 03/20/2018    History of Present Illness 78 yo male s/p L TKR on 03/19/18. PMH includes carotid artery stenosis, HTN, HLD, PVCs, GERD, PVD, L carotid artery angioplasty, R TKR.     PT Comments    Pt progressing well;' ready for d/c from PT standpoint  Follow Up Recommendations  Follow surgeon's recommendation for DC plan and follow-up therapies;Supervision for mobility/OOB     Equipment Recommendations  None recommended by PT    Recommendations for Other Services       Precautions / Restrictions Precautions Precautions: Fall Restrictions Weight Bearing Restrictions: No Other Position/Activity Restrictions: WBAT     Mobility  Bed Mobility   Bed Mobility: Supine to Sit     Supine to sit: Modified independent (Device/Increase time)     General bed mobility comments: no physical assist  Transfers Overall transfer level: Needs assistance Equipment used: Rolling walker (2 wheeled) Transfers: Sit to/from Stand Sit to Stand: Supervision         General transfer comment: cues for hand placement  Ambulation/Gait Ambulation/Gait assistance: Supervision Gait Distance (Feet): 150 Feet Assistive device: Rolling walker (2 wheeled) Gait Pattern/deviations: Step-to pattern;Decreased weight shift to left;Antalgic     General Gait Details: cues for sequence and gait progressison   Stairs      up/down  3 steps, with RW and min/guard assist       Wheelchair Mobility    Modified Rankin (Stroke Patients Only)       Balance                                            Cognition Arousal/Alertness: Awake/alert Behavior During Therapy: WFL for tasks assessed/performed Overall Cognitive Status: Within Functional Limits for tasks assessed                                        Exercises Total Joint Exercises Ankle  Circles/Pumps: AROM;15 reps;Supine Quad Sets: AROM;10 reps;Both Short Arc Quad: AROM;Left;10 reps Heel Slides: AAROM;AROM;10 reps;Left Hip ABduction/ADduction: AROM;Left;10 reps Straight Leg Raises: AROM;Strengthening;Left;10 reps Goniometric ROM: 5* to 95* AAROM L knee flexion     General Comments        Pertinent Vitals/Pain Pain Assessment: 0-10 Pain Score: 4  Pain Location: L knee  Pain Descriptors / Indicators: Sore;Discomfort Pain Intervention(s): Limited activity within patient's tolerance;Monitored during session;Ice applied;Premedicated before session;Repositioned    Home Living                      Prior Function            PT Goals (current goals can now be found in the care plan section) Acute Rehab PT Goals Patient Stated Goal: none stated  PT Goal Formulation: With patient Time For Goal Achievement: 03/26/18 Potential to Achieve Goals: Good    Frequency    7X/week      PT Plan Current plan remains appropriate    Co-evaluation              AM-PAC PT "6 Clicks" Mobility   Outcome Measure  Help needed turning from your back to your side while in a flat bed without using bedrails?: A Little Help needed moving  from lying on your back to sitting on the side of a flat bed without using bedrails?: A Little Help needed moving to and from a bed to a chair (including a wheelchair)?: A Little Help needed standing up from a chair using your arms (e.g., wheelchair or bedside chair)?: A Little Help needed to walk in hospital room?: A Little Help needed climbing 3-5 steps with a railing? : A Little 6 Click Score: 18    End of Session Equipment Utilized During Treatment: Gait belt Activity Tolerance: Patient tolerated treatment well Patient left: with call bell/phone within reach;in chair;with chair alarm set   PT Visit Diagnosis: Other abnormalities of gait and mobility (R26.89);Difficulty in walking, not elsewhere classified (R26.2)      Time: 2423-5361 PT Time Calculation (min) (ACUTE ONLY): 30 min  Charges:  $Gait Training: 8-22 mins $Therapeutic Exercise: 8-22 mins                     Kenyon Ana, PT  Pager: [phone] Acute Rehab Dept [facility]): [phone]   03/20/2018    [facility] 03/20/2018, 10:18 AM

## 2018-03-20 NOTE — Plan of Care (Signed)
  Problem: Education: Goal: Knowledge of General Education information will improve Description: Including pain rating scale, medication(s)/side effects and non-pharmacologic comfort measures Outcome: Progressing   Problem: Clinical Measurements: Goal: Will remain free from infection Outcome: Progressing Goal: Respiratory complications will improve Outcome: Progressing   Problem: Pain Managment: Goal: General experience of comfort will improve Outcome: Progressing   Problem: Safety: Goal: Ability to remain free from injury will improve Outcome: Progressing   

## 2018-03-21 DIAGNOSIS — Z7982 Long term (current) use of aspirin: Secondary | ICD-10-CM | POA: Diagnosis not present

## 2018-03-21 DIAGNOSIS — Z96653 Presence of artificial knee joint, bilateral: Secondary | ICD-10-CM | POA: Diagnosis not present

## 2018-03-21 DIAGNOSIS — I10 Essential (primary) hypertension: Secondary | ICD-10-CM | POA: Diagnosis not present

## 2018-03-21 DIAGNOSIS — E785 Hyperlipidemia, unspecified: Secondary | ICD-10-CM | POA: Diagnosis not present

## 2018-03-21 DIAGNOSIS — Z9181 History of falling: Secondary | ICD-10-CM | POA: Diagnosis not present

## 2018-03-21 DIAGNOSIS — Z471 Aftercare following joint replacement surgery: Secondary | ICD-10-CM | POA: Diagnosis not present

## 2018-03-21 DIAGNOSIS — I739 Peripheral vascular disease, unspecified: Secondary | ICD-10-CM | POA: Diagnosis not present

## 2018-03-24 DIAGNOSIS — Z7982 Long term (current) use of aspirin: Secondary | ICD-10-CM | POA: Diagnosis not present

## 2018-03-24 DIAGNOSIS — I10 Essential (primary) hypertension: Secondary | ICD-10-CM | POA: Diagnosis not present

## 2018-03-24 DIAGNOSIS — Z96653 Presence of artificial knee joint, bilateral: Secondary | ICD-10-CM | POA: Diagnosis not present

## 2018-03-24 DIAGNOSIS — E785 Hyperlipidemia, unspecified: Secondary | ICD-10-CM | POA: Diagnosis not present

## 2018-03-24 DIAGNOSIS — Z471 Aftercare following joint replacement surgery: Secondary | ICD-10-CM | POA: Diagnosis not present

## 2018-03-24 DIAGNOSIS — I739 Peripheral vascular disease, unspecified: Secondary | ICD-10-CM | POA: Diagnosis not present

## 2018-03-26 DIAGNOSIS — I10 Essential (primary) hypertension: Secondary | ICD-10-CM | POA: Diagnosis not present

## 2018-03-26 DIAGNOSIS — Z96653 Presence of artificial knee joint, bilateral: Secondary | ICD-10-CM | POA: Diagnosis not present

## 2018-03-26 DIAGNOSIS — E785 Hyperlipidemia, unspecified: Secondary | ICD-10-CM | POA: Diagnosis not present

## 2018-03-26 DIAGNOSIS — Z7982 Long term (current) use of aspirin: Secondary | ICD-10-CM | POA: Diagnosis not present

## 2018-03-26 DIAGNOSIS — I739 Peripheral vascular disease, unspecified: Secondary | ICD-10-CM | POA: Diagnosis not present

## 2018-03-26 DIAGNOSIS — Z471 Aftercare following joint replacement surgery: Secondary | ICD-10-CM | POA: Diagnosis not present

## 2018-03-27 DIAGNOSIS — Z471 Aftercare following joint replacement surgery: Secondary | ICD-10-CM | POA: Diagnosis not present

## 2018-03-27 DIAGNOSIS — Z96653 Presence of artificial knee joint, bilateral: Secondary | ICD-10-CM | POA: Diagnosis not present

## 2018-03-27 DIAGNOSIS — Z7982 Long term (current) use of aspirin: Secondary | ICD-10-CM | POA: Diagnosis not present

## 2018-03-27 DIAGNOSIS — E785 Hyperlipidemia, unspecified: Secondary | ICD-10-CM | POA: Diagnosis not present

## 2018-03-27 DIAGNOSIS — I739 Peripheral vascular disease, unspecified: Secondary | ICD-10-CM | POA: Diagnosis not present

## 2018-03-27 DIAGNOSIS — I10 Essential (primary) hypertension: Secondary | ICD-10-CM | POA: Diagnosis not present

## 2018-03-28 DIAGNOSIS — I739 Peripheral vascular disease, unspecified: Secondary | ICD-10-CM | POA: Diagnosis not present

## 2018-03-28 DIAGNOSIS — Z471 Aftercare following joint replacement surgery: Secondary | ICD-10-CM | POA: Diagnosis not present

## 2018-03-28 DIAGNOSIS — I10 Essential (primary) hypertension: Secondary | ICD-10-CM | POA: Diagnosis not present

## 2018-03-28 DIAGNOSIS — E785 Hyperlipidemia, unspecified: Secondary | ICD-10-CM | POA: Diagnosis not present

## 2018-03-28 DIAGNOSIS — Z7982 Long term (current) use of aspirin: Secondary | ICD-10-CM | POA: Diagnosis not present

## 2018-03-28 DIAGNOSIS — Z96653 Presence of artificial knee joint, bilateral: Secondary | ICD-10-CM | POA: Diagnosis not present

## 2018-03-29 DIAGNOSIS — Z471 Aftercare following joint replacement surgery: Secondary | ICD-10-CM | POA: Diagnosis not present

## 2018-03-29 DIAGNOSIS — M1712 Unilateral primary osteoarthritis, left knee: Secondary | ICD-10-CM | POA: Diagnosis not present

## 2018-03-29 DIAGNOSIS — M25662 Stiffness of left knee, not elsewhere classified: Secondary | ICD-10-CM | POA: Diagnosis not present

## 2018-03-29 DIAGNOSIS — Z96652 Presence of left artificial knee joint: Secondary | ICD-10-CM | POA: Diagnosis not present

## 2018-04-04 DIAGNOSIS — Z96652 Presence of left artificial knee joint: Secondary | ICD-10-CM | POA: Diagnosis not present

## 2018-04-04 DIAGNOSIS — M25662 Stiffness of left knee, not elsewhere classified: Secondary | ICD-10-CM | POA: Diagnosis not present

## 2018-04-06 DIAGNOSIS — Z96652 Presence of left artificial knee joint: Secondary | ICD-10-CM | POA: Diagnosis not present

## 2018-04-06 DIAGNOSIS — M25662 Stiffness of left knee, not elsewhere classified: Secondary | ICD-10-CM | POA: Diagnosis not present

## 2018-04-09 DIAGNOSIS — Z96652 Presence of left artificial knee joint: Secondary | ICD-10-CM | POA: Diagnosis not present

## 2018-04-09 DIAGNOSIS — M25662 Stiffness of left knee, not elsewhere classified: Secondary | ICD-10-CM | POA: Diagnosis not present

## 2018-04-11 DIAGNOSIS — Z96652 Presence of left artificial knee joint: Secondary | ICD-10-CM | POA: Diagnosis not present

## 2018-04-11 DIAGNOSIS — M25662 Stiffness of left knee, not elsewhere classified: Secondary | ICD-10-CM | POA: Diagnosis not present

## 2018-04-13 DIAGNOSIS — M25662 Stiffness of left knee, not elsewhere classified: Secondary | ICD-10-CM | POA: Diagnosis not present

## 2018-04-13 DIAGNOSIS — Z96652 Presence of left artificial knee joint: Secondary | ICD-10-CM | POA: Diagnosis not present

## 2018-04-17 DIAGNOSIS — Z96652 Presence of left artificial knee joint: Secondary | ICD-10-CM | POA: Diagnosis not present

## 2018-04-17 DIAGNOSIS — M25662 Stiffness of left knee, not elsewhere classified: Secondary | ICD-10-CM | POA: Diagnosis not present

## 2018-04-19 DIAGNOSIS — Z96652 Presence of left artificial knee joint: Secondary | ICD-10-CM | POA: Diagnosis not present

## 2018-04-19 DIAGNOSIS — M25662 Stiffness of left knee, not elsewhere classified: Secondary | ICD-10-CM | POA: Diagnosis not present

## 2018-04-24 DIAGNOSIS — Z96652 Presence of left artificial knee joint: Secondary | ICD-10-CM | POA: Diagnosis not present

## 2018-04-24 DIAGNOSIS — M25662 Stiffness of left knee, not elsewhere classified: Secondary | ICD-10-CM | POA: Diagnosis not present

## 2018-04-26 DIAGNOSIS — Z96652 Presence of left artificial knee joint: Secondary | ICD-10-CM | POA: Diagnosis not present

## 2018-04-26 DIAGNOSIS — Z471 Aftercare following joint replacement surgery: Secondary | ICD-10-CM | POA: Diagnosis not present

## 2018-04-26 DIAGNOSIS — M25662 Stiffness of left knee, not elsewhere classified: Secondary | ICD-10-CM | POA: Diagnosis not present

## 2018-05-02 DIAGNOSIS — M25662 Stiffness of left knee, not elsewhere classified: Secondary | ICD-10-CM | POA: Diagnosis not present

## 2018-05-02 DIAGNOSIS — Z96652 Presence of left artificial knee joint: Secondary | ICD-10-CM | POA: Diagnosis not present

## 2018-05-04 DIAGNOSIS — Z96652 Presence of left artificial knee joint: Secondary | ICD-10-CM | POA: Diagnosis not present

## 2018-05-04 DIAGNOSIS — M25662 Stiffness of left knee, not elsewhere classified: Secondary | ICD-10-CM | POA: Diagnosis not present

## 2018-05-07 DIAGNOSIS — M25662 Stiffness of left knee, not elsewhere classified: Secondary | ICD-10-CM | POA: Diagnosis not present

## 2018-05-07 DIAGNOSIS — Z96652 Presence of left artificial knee joint: Secondary | ICD-10-CM | POA: Diagnosis not present

## 2018-05-09 ENCOUNTER — Other Ambulatory Visit: Payer: Self-pay | Admitting: Cardiovascular Disease

## 2018-05-10 ENCOUNTER — Inpatient Hospital Stay (HOSPITAL_COMMUNITY): Admission: RE | Admit: 2018-05-10 | Payer: Medicare Other | Source: Ambulatory Visit

## 2018-05-10 ENCOUNTER — Ambulatory Visit: Payer: Medicare Other | Admitting: Family

## 2018-05-10 DIAGNOSIS — Z96652 Presence of left artificial knee joint: Secondary | ICD-10-CM | POA: Diagnosis not present

## 2018-05-10 DIAGNOSIS — M25662 Stiffness of left knee, not elsewhere classified: Secondary | ICD-10-CM | POA: Diagnosis not present

## 2018-05-17 ENCOUNTER — Other Ambulatory Visit: Payer: Self-pay | Admitting: Cardiovascular Disease

## 2018-06-07 ENCOUNTER — Other Ambulatory Visit: Payer: Self-pay

## 2018-06-07 DIAGNOSIS — M25662 Stiffness of left knee, not elsewhere classified: Secondary | ICD-10-CM | POA: Diagnosis not present

## 2018-06-07 MED ORDER — HYDROCHLOROTHIAZIDE 25 MG PO TABS: 25.0000 mg | ORAL_TABLET | Freq: Every day | ORAL | 1 refills | Status: DC

## 2018-06-14 ENCOUNTER — Ambulatory Visit: Payer: Medicare Other | Admitting: Cardiovascular Disease

## 2018-07-06 ENCOUNTER — Other Ambulatory Visit: Payer: Self-pay | Admitting: Cardiovascular Disease

## 2018-07-12 ENCOUNTER — Other Ambulatory Visit: Payer: Self-pay

## 2018-07-12 DIAGNOSIS — I6523 Occlusion and stenosis of bilateral carotid arteries: Secondary | ICD-10-CM

## 2018-07-13 ENCOUNTER — Other Ambulatory Visit: Payer: Self-pay | Admitting: Pharmacist Clinician (PhC)/ Clinical Pharmacy Specialist

## 2018-07-13 ENCOUNTER — Telehealth (HOSPITAL_COMMUNITY): Payer: Self-pay | Admitting: Rehabilitation

## 2018-07-13 ENCOUNTER — Telehealth: Payer: Self-pay | Admitting: Cardiovascular Disease

## 2018-07-13 NOTE — Telephone Encounter (Signed)
Already spoke with patient, sample in CVRR fridge

## 2018-07-13 NOTE — Telephone Encounter (Signed)
The above patient or their representative was contacted and gave the following answers to these questions:         Do you have any of the following symptoms? No  Fever                    Cough                   Shortness of breath  Do  you have any of the following other symptoms? No   muscle pain         vomiting,        diarrhea        rash         weakness        red eye        abdominal pain         bruising          bruising or bleeding              joint pain           severe headache    Have you been in contact with someone who was or has been sick in the past 2 weeks? No  Yes                 Unsure                         Unable to assess   Does the person that you were in contact with have any of the following symptoms?   Cough         shortness of breath           muscle pain         vomiting,            diarrhea            rash            weakness           fever            red eye           abdominal pain           bruising  or  bleeding                joint pain                severe headache               Have you  or someone you have been in contact with traveled internationally in th last month? No        If yes, which countries?   Have you  or someone you have been in contact with traveled outside [place] in th last month? No         If yes, which state and city?   COMMENTS OR ACTION PLAN FOR THIS PATIENT:          

## 2018-07-13 NOTE — Telephone Encounter (Signed)
Patient calling the office for samples of medication:   1.  What medication and dosage are you requesting samples for?  Patrick Vargas 420 MG/3.5ML SOCT  2.  Are you currently out of this medication?  yes

## 2018-07-13 NOTE — Telephone Encounter (Signed)
noted 

## 2018-07-13 NOTE — Telephone Encounter (Signed)
repatha sample [address] [employer]   Lot [id number]  [place] 11/2018

## 2018-07-16 ENCOUNTER — Encounter: Payer: Self-pay | Admitting: Family

## 2018-07-16 ENCOUNTER — Ambulatory Visit (INDEPENDENT_AMBULATORY_CARE_PROVIDER_SITE_OTHER): Payer: Medicare Other | Admitting: Family

## 2018-07-16 ENCOUNTER — Other Ambulatory Visit: Payer: Self-pay

## 2018-07-16 ENCOUNTER — Ambulatory Visit (HOSPITAL_COMMUNITY)
Admission: RE | Admit: 2018-07-16 | Discharge: 2018-07-16 | Disposition: A | Discharge status: Home / Self Care | Payer: Medicare Other | Source: Ambulatory Visit | Attending: Family | Admitting: Family

## 2018-07-16 VITALS — BP 125/80 | HR 73 | Temp 98.2°F | Resp 18 | Ht 70.0 in | Wt 181.0 lb

## 2018-07-16 DIAGNOSIS — Z9889 Other specified postprocedural states: Secondary | ICD-10-CM

## 2018-07-16 DIAGNOSIS — I6523 Occlusion and stenosis of bilateral carotid arteries: Secondary | ICD-10-CM | POA: Diagnosis not present

## 2018-07-16 NOTE — Patient Instructions (Signed)

## 2018-07-16 NOTE — Progress Notes (Signed)
Chief Complaint: Follow up Extracranial Carotid Artery Stenosis   History of Present Illness  Patrick Vargas is a 78 y.o. male who is s/p left carotid endarterectomywith Dacron patch on 04-12-17 by Dr. Oneida Alar for >80%leftinternal carotidstenosis.  He is also s/p right CEA on 03-06-17 by Dr. Oneida Alar for >80% stenosis.   He has no history of stroke or TIA, and he states he no longer sees spots in his visual fields since the CEA's.   He returned on 04-20-17  at the suggestion of his PCP who he saw recently, andc/ofeeling tired, fever and chills, mild difficulty using his tongue when eating, denies trouble breathing or swallowing. He hadsomecoughing spells that he has had before and after the CEA which he attributes to allergies and post nasal drip.  Pt denies hemiparesis, denies visual changes.  He denied pain at the left side of his neck, he did have mild swelling at the left side neck incision that did not bother him.   Dr. Oneida Alar last evaluated pt on 05-04-17. At that time py had symmetric upper extremity and lower extremity motor strength 5/5, despite subjective weakness tongue is midline he had some weakness of his voice though not complete hoarseness.  Doing well status post bilateral carotid endarterectomy. 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 He was to return to see Dr. Oneida Alar in 6 weeks if this had not completely resolved and we would consider further ear nose and throat evaluation. Otherwise he was to return and see our nurse practitioner in 6 months with a bilateral carotid duplex exam. He was to continue his aspirin and statin.  The neuropraxia symptoms and speech difficulties have resolved, no weakness in his speech.   He started Repatha on 04-15-17, states his cholesterol is under good control.   He denies claudication type symptoms in his legs with walking.   He works in his yard several times/week.   He had a left TKR in February  2020, and the right was done in 2014; he states both knees have improved.   Diabetic: no Tobacco use: non-smoker  Pt meds include: Statin : yes, pravastatin, lethargy reaction to another statin; he is also taking Repatha.  ASA: yes Other anticoagulants/antiplatelets: no  Past Medical History:  Diagnosis Date  . Arthritis   . Carotid artery occlusion   . Dyslipidemia   . GERD (gastroesophageal reflux disease)   . Headache    ocular migraines  . History of kidney stones   . Peripheral vascular disease ([place])    Carotid stenosis right   . PVC's (premature ventricular contractions)   . Systemic hypertension   . 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 Smoking status: Never Smoker  . Smokeless tobacco: Never Used  Substance Use Topics  . Alcohol use: Yes    Alcohol/week: 5.0 standard drinks    Types: 5 Standard drinks or equivalent per week  . Drug use: No    Family History Family History  Problem Relation Age of Onset  . Peripheral Artery Disease Mother   . Stroke Father     Surgical History Past Surgical History:  Procedure Laterality Date  . BUNIONECTOMY  1994   Left foot  . CAROTID ENDARTERECTOMY    . CARPAL TUNNEL RELEASE  1999   Left hand  . ENDARTERECTOMY Right 03/06/2017   Procedure: ENDARTERECTOMY CAROTID RIGHT;  Surgeon: Elam Dutch,  MD;  Location: [place];  Service: Vascular;  Laterality: Right;  . ENDARTERECTOMY Left 04/12/2017   Procedure: ENDARTERECTOMY CAROTID LEFT;  Surgeon: Elam Dutch, MD;  Location: [place];  Service: Vascular;  Laterality: Left;  . PATCH ANGIOPLASTY Left 04/12/2017   Procedure: PATCH ANGIOPLASTY LEFT CAROTID ARTERY;  Surgeon: Elam Dutch, MD;  Location: [facility];  Service: Vascular;  Laterality: Left;  . TENDON REPAIR Right    right foot  . TONSILLECTOMY    . TOTAL KNEE ARTHROPLASTY  12/04/2010   Right  . TOTAL KNEE ARTHROPLASTY Left 03/19/2018   Procedure: TOTAL  KNEE ARTHROPLASTY;  Surgeon: Dorna Leitz, MD;  Location: [facility];  Service: Orthopedics;  Laterality: Left;    Allergies  Allergen Reactions  . Amoxicillin Nausea Only and Other (See Comments)    Has patient had a PCN reaction causing immediate rash, facial/tongue/throat swelling, SOB or lightheadedness with hypotension: No Has patient had a PCN reaction causing severe rash involving mucus membranes or skin necrosis: No Has patient had a PCN reaction that required hospitalization: No Has patient had a PCN reaction occurring within the last 10 years: No If all of the above answers are "NO", then may proceed with Cephalosporin use.   Marland Kitchen Oxycodone Nausea Only  . Statins Other (See Comments)    Depression     Current Outpatient Medications  Medication Sig Dispense Refill  . acetaminophen (TYLENOL) 500 MG tablet Take 1,000 mg by mouth every 6 (six) hours as needed for moderate pain or headache.    Marland Kitchen amLODipine (NORVASC) 10 MG tablet TAKE 1 TABLET BY MOUTH EVERY DAY 90 tablet 4  . aspirin EC 325 MG tablet Take 1 tablet (325 mg total) by mouth 2 (two) times daily after a meal. Take x 1 month post op to decrease risk of blood clots. (Patient taking differently: Take 81 mg by mouth daily. Take x 1 month post op to decrease risk of blood clots.) 60 tablet 0  . CELEBREX 200 MG capsule Take 200 mg by mouth daily.     . cetirizine (ZYRTEC) 10 MG tablet Take 10 mg by mouth daily.     . Coenzyme Q10 200 MG capsule Take 200 mg by mouth daily.    Marland Kitchen docusate sodium (COLACE) 100 MG capsule Take 1 capsule (100 mg total) by mouth 2 (two) times daily. 30 capsule 0  . hydrochlorothiazide (HYDRODIURIL) 25 MG tablet Take 1 tablet (25 mg total) by mouth daily. 90 tablet 1  . HYDROcodone-acetaminophen (NORCO) 10-325 MG tablet Take 1 tablet by mouth every 4 (four) hours as needed for severe pain. 40 tablet 0  . lisinopril (ZESTRIL) 20 MG tablet TAKE 1 TABLET BY MOUTH EVERY DAY 90 tablet 0  . pravastatin (PRAVACHOL)  40 MG tablet TAKE 1 TABLET BY MOUTH EVERY EVENING 90 tablet 4  . [place] 420 MG/3.5ML SOCT INJECT 420MG  INTO SKIN EVERY 30 DAYS 3.5 mL 11  . tiZANidine (ZANAFLEX) 2 MG tablet Take 1 tablet (2 mg total) by mouth every 8 (eight) hours as needed for muscle spasms. 40 tablet 0  . traMADol (ULTRAM) 50 MG tablet Take 1 tablet (50 mg total) by mouth every 6 (six) hours as needed for moderate pain. 8 tablet 0   No current facility-administered medications for this visit.     Review of Systems : See HPI for pertinent positives and negatives.  Physical Examination  Vitals:   07/16/18 1334 07/16/18 1336  BP: 128/77 125/80  Pulse: 73  73  Resp: 18   Temp: 98.2 F (36.8 C)   TempSrc: Oral   SpO2: 96%   Weight: 181 lb (82.1 kg)   Height: 5\' 10"  (1.778 m)    Body mass index is 25.97 kg/m.  General: WDWN male in NAD GAIT: normal Eyes: PERRLA HENT: No gross abnormalities.  Pulmonary:  Respirations are non-labored, good air movement in all fields, CTAB, no rales, rhonchi, or wheezes Cardiac: regular rhythm, no detected murmur.  VASCULAR EXAM Carotid Bruits Right Left   Negative Negative     Abdominal aortic pulse is not palpable. Radial pulses are 2+ palpable and equal.                                                                                                                            LE Pulses Right Left       POPLITEAL  not palpable   not palpable       POSTERIOR TIBIAL   palpable    palpable        DORSALIS PEDIS      ANTERIOR TIBIAL  palpable   palpable     Gastrointestinal: soft, nontender, BS WNL, no r/g, no palpable masses. Musculoskeletal: no muscle atrophy/wasting. M/S 5/5 throughout, extremities without ischemic changes.  Skin: No rashes, no ulcers, no cellulitis.   Neurologic:  A&O X 3; appropriate affect, sensation is normal; speech is normal, CN 2-12 intact, pain and light touch intact in extremities, motor exam as listed above. Psychiatric:  Normal thought content, mood appropriate to clinical situation.   Assessment: Patrick Vargas is a 78 y.o. male who is s/p left carotid endarterectomywith Dacron patch on 04-12-17 by Dr. Oneida Alar for >80%leftinternal carotidstenosis.  He is also s/p right CEA on 03-06-17 by Dr. Oneida Alar for >80% stenosis.   He has no history of stroke or TIA, and he states he no longer sees spots in his visual fields since the CEA's.  The left neuropraxia and weakness in his voice has resolved.   Fortunately he does not have DM and has never used tobacco. He takes Repatha and pravastatin, takes a daily 81 mg ASA.  His pedal pulses are palpable.   Carotid duplex today demonstrates minimal stenosis of the bilateral extracranial carotid arteries, no change from 11-09-17.   DATA Carotid Duplex (07-16-18): Right Carotid: Velocities in the right ICA are consistent with a 1-39% stenosis. Left Carotid: Velocities in the left ICA are consistent with a 1-39% stenosis. Vertebrals:  Bilateral vertebral arteries demonstrate antegrade flow. Subclavians: Normal flow hemodynamics were seen in bilateral subclavian arteries.   Plan: Follow-up in 1 year with Carotid Duplex scan.   I discussed in depth with the patient the nature of atherosclerosis, and emphasized the importance of maximal medical management including strict control of blood pressure, blood glucose, and lipid levels, obtaining regular exercise, and continued cessation of smoking.  The patient is aware that without maximal medical management the underlying atherosclerotic disease process  will progress, limiting the benefit of any interventions. The patient was given information about stroke prevention and what symptoms should prompt the patient to seek immediate medical care. Thank you for allowing [place] to participate in this patient's care.  Clemon Chambers, RN, MSN, FNP-C [facility] Office: [facility] Physician:  Trula Slade  07/16/18 1:56 PM

## 2018-09-11 DIAGNOSIS — Z85828 Personal history of other malignant neoplasm of skin: Secondary | ICD-10-CM | POA: Diagnosis not present

## 2018-09-11 DIAGNOSIS — L814 Other melanin hyperpigmentation: Secondary | ICD-10-CM | POA: Diagnosis not present

## 2018-09-11 DIAGNOSIS — L570 Actinic keratosis: Secondary | ICD-10-CM | POA: Diagnosis not present

## 2018-09-11 DIAGNOSIS — L821 Other seborrheic keratosis: Secondary | ICD-10-CM | POA: Diagnosis not present

## 2018-09-11 DIAGNOSIS — Z1283 Encounter for screening for malignant neoplasm of skin: Secondary | ICD-10-CM | POA: Diagnosis not present

## 2018-09-28 ENCOUNTER — Ambulatory Visit: Payer: Medicare Other | Admitting: Cardiovascular Disease

## 2018-10-05 ENCOUNTER — Other Ambulatory Visit: Payer: Self-pay

## 2018-10-05 MED ORDER — LISINOPRIL 20 MG PO TABS: 20.0000 mg | ORAL_TABLET | Freq: Every day | ORAL | 0 refills | Status: DC

## 2018-12-07 ENCOUNTER — Other Ambulatory Visit: Payer: Self-pay

## 2018-12-07 ENCOUNTER — Ambulatory Visit (INDEPENDENT_AMBULATORY_CARE_PROVIDER_SITE_OTHER): Payer: Medicare Other | Admitting: Cardiovascular Disease

## 2018-12-07 ENCOUNTER — Encounter: Payer: Self-pay | Admitting: Cardiovascular Disease

## 2018-12-07 VITALS — BP 131/78 | HR 70 | Temp 97.5°F | Ht 70.0 in | Wt 185.0 lb

## 2018-12-07 DIAGNOSIS — I10 Essential (primary) hypertension: Secondary | ICD-10-CM | POA: Diagnosis not present

## 2018-12-07 DIAGNOSIS — E663 Overweight: Secondary | ICD-10-CM | POA: Diagnosis not present

## 2018-12-07 DIAGNOSIS — I493 Ventricular premature depolarization: Secondary | ICD-10-CM

## 2018-12-07 DIAGNOSIS — I739 Peripheral vascular disease, unspecified: Secondary | ICD-10-CM | POA: Diagnosis not present

## 2018-12-07 DIAGNOSIS — I6523 Occlusion and stenosis of bilateral carotid arteries: Secondary | ICD-10-CM | POA: Diagnosis not present

## 2018-12-07 DIAGNOSIS — E7800 Pure hypercholesterolemia, unspecified: Secondary | ICD-10-CM

## 2018-12-07 NOTE — Progress Notes (Signed)
Cardiology Office Note    Date:  12/07/2018   ID:  Patrick Vargas, DOB 1940-08-28, MRN [id number]  PCP:  Patrick Small, MD  Cardiologist:   Patrick Klein, MD   No chief complaint on file.   History of Present Illness:  Patrick Vargas is a 78 y.o. male with hypertension and hyperlipidemia returning for follow-up after undergoing bilateral carotid endarterectomy on January 21.  He had a very brief episode of amaurosis fugax and was referred by his ophthalmologist for carotid ultrasonography which showed bilateral >80% stenosis.  He underwent right carotid endarterectomy with Dr. Oneida Alar in January 2019 and subsequent left carotid endarterectomy 1 month later.  He then underwent a left total knee replacement in February 2020 from which she has recovered well.  The patient specifically denies any chest pain at rest exertion, dyspnea at rest or with exertion, orthopnea, paroxysmal nocturnal dyspnea, syncope, palpitations, focal neurological deficits, intermittent claudication, lower extremity edema, unexplained weight gain, cough, hemoptysis or wheezing.  He had a normal echo in 2013 and normal nuclear stress tests in 2004 and 2007, but has never had coronary angiography.   He has long-standing severe hypercholesterolemia.  Despite compliance with pravastatin, his LDL cholesterol  was still very elevated at 151.  He did not tolerate more potent statins (he has tried simvastatin, rosuvastatin and atorvastatin).  After starting Repatha his follow-up LDL cholesterol was 54 in May 2019, a little higher at 74 in November 2019.    Past Medical History:  Diagnosis Date  . Arthritis   . Carotid artery occlusion   . Dyslipidemia   . GERD (gastroesophageal reflux disease)   . Headache    ocular migraines  . History of kidney stones   . Peripheral vascular disease ([place])    Carotid stenosis right   . PVC's (premature ventricular contractions)   . Systemic hypertension   . Transient global amnesia     30 years ago    Past Surgical History:  Procedure Laterality Date  . BUNIONECTOMY  1994   Left foot  . CAROTID ENDARTERECTOMY    . CARPAL TUNNEL RELEASE  1999   Left hand  . ENDARTERECTOMY Right 03/06/2017   Procedure: ENDARTERECTOMY CAROTID RIGHT;  Surgeon: Patrick Dutch, MD;  Location: [place];  Service: Vascular;  Laterality: Right;  . ENDARTERECTOMY Left 04/12/2017   Procedure: ENDARTERECTOMY CAROTID LEFT;  Surgeon: Patrick Dutch, MD;  Location: [place];  Service: Vascular;  Laterality: Left;  . PATCH ANGIOPLASTY Left 04/12/2017   Procedure: PATCH ANGIOPLASTY LEFT CAROTID ARTERY;  Surgeon: Patrick Dutch, MD;  Location: [facility];  Service: Vascular;  Laterality: Left;  . TENDON REPAIR Right    right foot  . TONSILLECTOMY    . TOTAL KNEE ARTHROPLASTY  12/04/2010   Right  . TOTAL KNEE ARTHROPLASTY Left 03/19/2018   Procedure: TOTAL KNEE ARTHROPLASTY;  Surgeon: Dorna Leitz, MD;  Location: [facility];  Service: Orthopedics;  Laterality: Left;    Current Medications: Outpatient Medications Prior to Visit  Medication Sig Dispense Refill  . amLODipine (NORVASC) 10 MG tablet TAKE 1 TABLET BY MOUTH EVERY DAY 90 tablet 4  . aspirin EC 81 MG tablet Take 81 mg by mouth daily.    . CELEBREX 200 MG capsule Take 200 mg by mouth daily.     . cetirizine (ZYRTEC) 10 MG tablet Take 10 mg by mouth daily.     . Coenzyme Q10 200 MG capsule Take 200 mg by mouth daily.    Marland Kitchen  hydrochlorothiazide (HYDRODIURIL) 25 MG tablet Take 1 tablet (25 mg total) by mouth daily. 90 tablet 1  . lisinopril (ZESTRIL) 20 MG tablet Take 1 tablet (20 mg total) by mouth daily. 90 tablet 0  . pravastatin (PRAVACHOL) 40 MG tablet TAKE 1 TABLET BY MOUTH EVERY EVENING 90 tablet 4  . [place] 420 MG/3.5ML SOCT INJECT 420MG  INTO SKIN EVERY 30 DAYS 3.5 mL 11  . acetaminophen (TYLENOL) 500 MG tablet Take 1,000 mg by mouth every 6 (six) hours as needed for moderate pain or headache.    Marland Kitchen aspirin EC 325 MG tablet  Take 1 tablet (325 mg total) by mouth 2 (two) times daily after a meal. Take x 1 month post op to decrease risk of blood clots. (Patient not taking: Reported on 12/07/2018) 60 tablet 0  . docusate sodium (COLACE) 100 MG capsule Take 1 capsule (100 mg total) by mouth 2 (two) times daily. (Patient not taking: Reported on 12/07/2018) 30 capsule 0  . HYDROcodone-acetaminophen (NORCO) 10-325 MG tablet Take 1 tablet by mouth every 4 (four) hours as needed for severe pain. (Patient not taking: Reported on 12/07/2018) 40 tablet 0  . tiZANidine (ZANAFLEX) 2 MG tablet Take 1 tablet (2 mg total) by mouth every 8 (eight) hours as needed for muscle spasms. (Patient not taking: Reported on 12/07/2018) 40 tablet 0  . traMADol (ULTRAM) 50 MG tablet Take 1 tablet (50 mg total) by mouth every 6 (six) hours as needed for moderate pain. (Patient not taking: Reported on 12/07/2018) 8 tablet 0   No facility-administered medications prior to visit.      Allergies:   Amoxicillin, Oxycodone, and Statins   Social History   Socioeconomic History  . Marital status: Married    Spouse name: Not on file  . Number of children: Not on file  . Years of education: Not on file  . Highest education level: Not on file  Occupational History  . Not on file  Social Needs  . Financial resource strain: Not on file  . Food insecurity    Worry: Not on file    Inability: Not on file  . Transportation needs    Medical: Not on file    Non-medical: Not on file  Tobacco Use  . Smoking status: Never Smoker  . Smokeless tobacco: Never Used  Substance and Sexual Activity  . Alcohol use: Yes    Alcohol/week: 5.0 standard drinks    Types: 5 Standard drinks or equivalent per week  . Drug use: No  . Sexual activity: Not on file  Lifestyle  . Physical activity    Days per week: Not on file    Minutes per session: Not on file  . Stress: Not on file  Relationships  . Social Herbalist on phone: Not on file    Gets  together: Not on file    Attends religious service: Not on file    Active member of club or organization: Not on file    Attends meetings of clubs or organizations: Not on file    Relationship status: Not on file  Other Topics Concern  . Not on file  Social History Narrative  . Not on file      ROS:   Please see the history of present illness.    ROS All other systems are reviewed and are negative.  PHYSICAL EXAM:   VS:  BP 131/78   Pulse 70   Temp (!) 97.5 F (36.4  C)   Ht 5\' 10"  (1.778 m)   Wt 185 lb (83.9 kg)   SpO2 97%   BMI 26.54 kg/m      General: Alert, oriented x3, no distress, appears well Head: no evidence of trauma, PERRL, EOMI, no exophtalmos or lid lag, no myxedema, no xanthelasma; normal ears, nose and oropharynx Neck: normal jugular venous pulsations and no hepatojugular reflux; brisk carotid pulses without delay and no carotid bruits Chest: clear to auscultation, no signs of consolidation by percussion or palpation, normal fremitus, symmetrical and full respiratory excursions Cardiovascular: normal position and quality of the apical impulse, regular rhythm, normal first and second heart sounds, no murmurs, rubs or gallops Abdomen: no tenderness or distention, no masses by palpation, no abnormal pulsatility or arterial bruits, normal bowel sounds, no hepatosplenomegaly Extremities: no clubbing, cyanosis or edema; 2+ radial, ulnar and brachial pulses bilaterally; 2+ right femoral, posterior tibial and dorsalis pedis pulses; 2+ left femoral, posterior tibial and dorsalis pedis pulses; no subclavian or femoral bruits Neurological: grossly nonfocal Psych: Normal mood and affect   Wt Readings from Last 3 Encounters:  12/07/18 185 lb (83.9 kg)  07/16/18 181 lb (82.1 kg)  03/19/18 191 lb 3.2 oz (86.7 kg)      Studies/Labs Reviewed:   EKG:  EKG is ordered today.  It shows normal sinus rhythm and is a normal tracing  Recent Labs: September 2018  Creatinine  1.33, hemoglobin 12.2, potassium 4.1  total cholesterol 223,Triglycerides 172, HDL 38, LDL 151  Normal liver function tests     Component Value Date/Time   CHOL 165 12/22/2017 0825   TRIG 180 (H) 12/22/2017 0825   HDL 43 12/22/2017 0825   CHOLHDL 3.8 12/22/2017 0825   CHOLHDL 5.7 09/16/2013 0832   VLDL 44 (H) 09/16/2013 0832   LDLCALC 86 12/22/2017 0825    Additional studies/ records that were reviewed today include:  Dr. Oneida Alar, recent CEA admission  ASSESSMENT:    1. Hypercholesterolemia   2. PAD (peripheral artery disease) ([place])   3. Essential hypertension   4. Overweight   5. Frequent PVCs      PLAN:  In order of problems listed above:  1. HLP: He probably has [place].  On combination pravastatin and Repatha his LDL cholesterol has been 52-86 this year.  I think he clearly needs both agents.  He did not tolerate more potent statins due to side effects.  Make sure he takes co-Q10 at least 300 mg daily. 2. S/P BILATERAL CEA for carotid stenosis: He had successful bilateral endarterectomy with resolution of his visual complaints.  Ultrasound performed in June with good results. 3. HTN: Well-controlled 4. Overweight: Mildly overweight 5. PVCs: Seen on previous tracings frequently, but none today and asymptomatic.  No specific therapy indicated.     Medication Adjustments/Labs and Tests Ordered: Current medicines are reviewed at length with the patient today.  Concerns regarding medicines are outlined above.  Medication changes, Labs and Tests ordered today are listed in the Patient Instructions below. Patient Instructions  Medication Instructions:  No changes *If you need a refill on your cardiac medications before your next appointment, please call your pharmacy*  Lab Work: None ordered If you have labs (blood work) drawn today and your tests are completely normal, you will receive your results only by: Marland Kitchen MyChart Message (if you have MyChart) OR . A paper copy in the  mail If you have any lab test that is abnormal or we need to change your treatment, we will  call you to review the results.  Testing/Procedures: None ordered  Follow-Up: At [facility], you and your health needs are our priority.  As part of our continuing mission to provide you with exceptional heart care, we have created designated Provider Care Teams.  These Care Teams include your primary Cardiologist (physician) and Advanced Practice Providers (APPs -  Physician Assistants and Nurse Practitioners) who all work together to provide you with the care you need, when you need it.  Your next appointment:   12 months  The format for your next appointment:   In Person  Provider:   Sanda Klein, MD      Signed, Patrick Klein, MD  12/07/2018 12:15 PM    [address], [place], [place]  [address] Phone: [phone]; Fax: [phone]

## 2018-12-07 NOTE — Patient Instructions (Signed)

## 2018-12-10 ENCOUNTER — Other Ambulatory Visit: Payer: Self-pay

## 2018-12-10 MED ORDER — HYDROCHLOROTHIAZIDE 25 MG PO TABS: 25.0000 mg | ORAL_TABLET | Freq: Every day | ORAL | 3 refills | Status: DC

## 2018-12-11 DIAGNOSIS — Z96652 Presence of left artificial knee joint: Secondary | ICD-10-CM | POA: Diagnosis not present

## 2018-12-11 DIAGNOSIS — M25561 Pain in right knee: Secondary | ICD-10-CM | POA: Diagnosis not present

## 2018-12-11 DIAGNOSIS — M25562 Pain in left knee: Secondary | ICD-10-CM | POA: Diagnosis not present

## 2018-12-11 DIAGNOSIS — Z96651 Presence of right artificial knee joint: Secondary | ICD-10-CM | POA: Diagnosis not present

## 2018-12-17 DIAGNOSIS — Z23 Encounter for immunization: Secondary | ICD-10-CM | POA: Diagnosis not present

## 2018-12-19 ENCOUNTER — Telehealth: Payer: Self-pay

## 2018-12-19 NOTE — Telephone Encounter (Signed)
Repatha 420mg  have been provided to the patient.  Drug name: Repatha Pushtronix    Strength:420mg         Qty: 1 [id number]  Exp.Date: 09/2019   Allean Found 10:39 AM 12/19/2018

## 2019-01-22 ENCOUNTER — Other Ambulatory Visit: Payer: Self-pay | Admitting: *Deleted

## 2019-01-22 MED ORDER — LISINOPRIL 20 MG PO TABS: 20.0000 mg | ORAL_TABLET | Freq: Every day | ORAL | 2 refills | Status: DC

## 2019-01-22 NOTE — Telephone Encounter (Signed)
Rx has been sent to the pharmacy electronically. ° °

## 2019-01-31 ENCOUNTER — Telehealth: Payer: Self-pay

## 2019-01-31 NOTE — Telephone Encounter (Signed)
[id number] Group:[id number]

## 2019-02-18 DIAGNOSIS — I6529 Occlusion and stenosis of unspecified carotid artery: Secondary | ICD-10-CM | POA: Diagnosis not present

## 2019-02-18 DIAGNOSIS — K219 Gastro-esophageal reflux disease without esophagitis: Secondary | ICD-10-CM | POA: Diagnosis not present

## 2019-02-18 DIAGNOSIS — E785 Hyperlipidemia, unspecified: Secondary | ICD-10-CM | POA: Diagnosis not present

## 2019-02-18 DIAGNOSIS — Z0000 Encounter for general adult medical examination without abnormal findings: Secondary | ICD-10-CM | POA: Diagnosis not present

## 2019-02-18 DIAGNOSIS — I10 Essential (primary) hypertension: Secondary | ICD-10-CM | POA: Diagnosis not present

## 2019-03-09 ENCOUNTER — Ambulatory Visit: Payer: Medicare Other | Attending: Internal Medicine

## 2019-03-09 DIAGNOSIS — Z23 Encounter for immunization: Secondary | ICD-10-CM

## 2019-03-09 NOTE — Progress Notes (Signed)
   Covid-19 Vaccination Clinic  Name:  Patrick Vargas    MRN: [id number] DOB: 1940/02/24  03/09/2019  [name] was observed post Covid-19 immunization for 15 minutes without incidence. He was provided with Vaccine Information Sheet and instruction to access the V-Safe system.   [name] was instructed to call 911 with any severe reactions post vaccine: Marland Kitchen Difficulty breathing  . Swelling of your face and throat  . A fast heartbeat  . A bad rash all over your body  . Dizziness and weakness    Immunizations Administered    Name Date Dose VIS Date Route   Pfizer COVID-19 Vaccine 03/09/2019 12:28 PM 0.3 mL 01/25/2019 Intramuscular   Manufacturer: [place]   Lot: [id number]   [place]: [id number]

## 2019-03-30 ENCOUNTER — Ambulatory Visit: Payer: Medicare Other | Attending: Internal Medicine

## 2019-03-30 DIAGNOSIS — Z23 Encounter for immunization: Secondary | ICD-10-CM

## 2019-03-30 NOTE — Progress Notes (Signed)
   Covid-19 Vaccination Clinic  Name:  Patrick Vargas    MRN: [id number] DOB: 1940/07/01  03/30/2019  Patrick Vargas was observed post Covid-19 immunization for 15 minutes without incidence. He was provided with Vaccine Information Sheet and instruction to access the V-Safe system.   Patrick Vargas was instructed to call 911 with any severe reactions post vaccine: Marland Kitchen Difficulty breathing  . Swelling of your face and throat  . A fast heartbeat  . A bad rash all over your body  . Dizziness and weakness    Immunizations Administered    Name Date Dose VIS Date Route   Pfizer COVID-19 Vaccine 03/30/2019 11:37 AM 0.3 mL 01/25/2019 Intramuscular   Manufacturer: [place]   Lot: [id number]   [place]: [id number]

## 2019-04-24 DIAGNOSIS — G5601 Carpal tunnel syndrome, right upper limb: Secondary | ICD-10-CM | POA: Diagnosis not present

## 2019-05-15 ENCOUNTER — Other Ambulatory Visit: Payer: Self-pay

## 2019-05-15 MED ORDER — REPATHA PUSHTRONEX SYSTEM 420 MG/3.5ML ~~LOC~~ SOCT: 3.5000 mL | SUBCUTANEOUS | 11 refills | Status: DC

## 2019-05-16 DIAGNOSIS — H524 Presbyopia: Secondary | ICD-10-CM | POA: Diagnosis not present

## 2019-05-16 DIAGNOSIS — H35033 Hypertensive retinopathy, bilateral: Secondary | ICD-10-CM | POA: Diagnosis not present

## 2019-05-16 DIAGNOSIS — H5202 Hypermetropia, left eye: Secondary | ICD-10-CM | POA: Diagnosis not present

## 2019-05-16 DIAGNOSIS — H35373 Puckering of macula, bilateral: Secondary | ICD-10-CM | POA: Diagnosis not present

## 2019-05-16 DIAGNOSIS — H52221 Regular astigmatism, right eye: Secondary | ICD-10-CM | POA: Diagnosis not present

## 2019-05-16 DIAGNOSIS — H25813 Combined forms of age-related cataract, bilateral: Secondary | ICD-10-CM | POA: Diagnosis not present

## 2019-05-16 DIAGNOSIS — H5201 Hypermetropia, right eye: Secondary | ICD-10-CM | POA: Diagnosis not present

## 2019-05-16 DIAGNOSIS — H04123 Dry eye syndrome of bilateral lacrimal glands: Secondary | ICD-10-CM | POA: Diagnosis not present

## 2019-05-16 DIAGNOSIS — H3401 Transient retinal artery occlusion, right eye: Secondary | ICD-10-CM | POA: Diagnosis not present

## 2019-06-07 ENCOUNTER — Ambulatory Visit (INDEPENDENT_AMBULATORY_CARE_PROVIDER_SITE_OTHER): Payer: Medicare Other | Admitting: Neurology

## 2019-06-07 ENCOUNTER — Other Ambulatory Visit: Payer: Self-pay

## 2019-06-07 DIAGNOSIS — G5600 Carpal tunnel syndrome, unspecified upper limb: Secondary | ICD-10-CM | POA: Diagnosis not present

## 2019-06-07 DIAGNOSIS — G5601 Carpal tunnel syndrome, right upper limb: Secondary | ICD-10-CM

## 2019-06-07 NOTE — Procedures (Signed)
[facility]  [address]  [place], [place] [address] Tel: [phone] Fax:  [phone] Test Date:  06/07/2019  Patient: Patrick Vargas DOB: 1940/09/20 Physician: Narda Amber, DO  Sex: Male Height: 5\' 10"  Ref Phys: Marchia Bond, MD  ID#: [id number] Temp: 35.0C Technician:    Patient Complaints: This is a 79 year old man referred for evaluation of right hand numbness and tingling concerning for carpal tunnel syndrome.  NCV & EMG Findings: Extensive electrodiagnostic testing of the right upper extremity shows:  1. Right median sensory response shows prolonged latency (5.1 ms) and reduced amplitude (8.4 V).  Right ulnar sensory responses within normal limits. 2. Right median motor response shows prolonged latency (5.6 ms) and reduced amplitude (4.2 mV).  Right ulnar motor responses within normal limits.   3. Chronic motor axonal loss changes are seen affecting the right abductor pollicis brevis muscle, without accompanying active denervation.  Impression: Right median neuropathy at or distal to the wrist, consistent with a clinical diagnosis of carpal tunnel syndrome.  Overall, these findings are severe in degree electrically.   ___________________________ Narda Amber, DO    Nerve Conduction Studies Anti Sensory Summary Table   Stim Site NR Peak (ms) Norm Peak (ms) P-T Amp (V) Norm P-T Amp  Right Median Anti Sensory (2nd Digit)  35C  Wrist    5.1 <3.8 8.4 >10  Right Ulnar Anti Sensory (5th Digit)  35C  Wrist    2.9 <3.2 14.5 >5   Motor Summary Table   Stim Site NR Onset (ms) Norm Onset (ms) O-P Amp (mV) Norm O-P Amp Site1 Site2 Delta-0 (ms) Dist (cm) Vel (m/s) Norm Vel (m/s)  Right Median Motor (Abd Poll Brev)  35C  Wrist    5.6 <4.0 4.2 >5 Elbow Wrist 5.6 32.0 57 >50  Elbow    11.2  4.0         Right Ulnar Motor (Abd Dig Minimi)  35C  Wrist    2.7 <3.1 8.2 >7 B Elbow Wrist 4.6 24.0 52 >50  B Elbow    7.3  7.7  A Elbow B Elbow 1.8 10.0 56  >50  A Elbow    9.1  7.3          EMG   Side Muscle Ins Act Fibs Psw Fasc Number Recrt Dur Dur. Amp Amp. Poly Poly. Comment  Right 1stDorInt Nml Nml Nml Nml Nml Nml Nml Nml Nml Nml Nml Nml N/A  Right Abd Poll Brev Nml Nml Nml Nml 2- Rapid Some 1+ Some 1+ Some 1+ N/A  Right PronatorTeres Nml Nml Nml Nml Nml Nml Nml Nml Nml Nml Nml Nml N/A  Right Biceps Nml Nml Nml Nml Nml Nml Nml Nml Nml Nml Nml Nml N/A  Right Triceps Nml Nml Nml Nml Nml Nml Nml Nml Nml Nml Nml Nml N/A  Right Deltoid Nml Nml Nml Nml Nml Nml Nml Nml Nml Nml Nml Nml N/A      Waveforms:

## 2019-06-10 DIAGNOSIS — G5601 Carpal tunnel syndrome, right upper limb: Secondary | ICD-10-CM | POA: Diagnosis not present

## 2019-06-12 ENCOUNTER — Encounter: Payer: Medicare Other | Admitting: Neurology

## 2019-06-24 ENCOUNTER — Other Ambulatory Visit: Payer: Self-pay | Admitting: Cardiovascular Disease

## 2019-06-24 NOTE — Telephone Encounter (Signed)
*  STAT* If patient is at the pharmacy, call can be transferred to refill team.   1. Which medications need to be refilled? (please list name of each medication and dose if known) amLODipine (NORVASC) 10 MG tablet  2. Which pharmacy/location (including street and city if local pharmacy) is medication to be sent to?  [facility] [place], [address]  3. Do they need a 30 day or 90 day supply? 90  Pt is out of medication

## 2019-06-25 MED ORDER — AMLODIPINE BESYLATE 10 MG PO TABS: 10.0000 mg | ORAL_TABLET | Freq: Every day | ORAL | 1 refills | Status: AC

## 2019-07-11 DIAGNOSIS — G5601 Carpal tunnel syndrome, right upper limb: Secondary | ICD-10-CM | POA: Diagnosis not present

## 2019-07-18 ENCOUNTER — Other Ambulatory Visit: Payer: Self-pay | Admitting: *Deleted

## 2019-07-18 MED ORDER — PRAVASTATIN SODIUM 40 MG PO TABS: 40.0000 mg | ORAL_TABLET | Freq: Every evening | ORAL | 1 refills | Status: DC

## 2019-07-18 NOTE — Telephone Encounter (Signed)
Rx has been sent to the pharmacy electronically. ° °

## 2019-08-30 ENCOUNTER — Other Ambulatory Visit: Payer: Self-pay

## 2019-08-30 DIAGNOSIS — I6523 Occlusion and stenosis of bilateral carotid arteries: Secondary | ICD-10-CM

## 2019-09-04 DIAGNOSIS — G5601 Carpal tunnel syndrome, right upper limb: Secondary | ICD-10-CM | POA: Diagnosis not present

## 2019-09-09 ENCOUNTER — Ambulatory Visit (INDEPENDENT_AMBULATORY_CARE_PROVIDER_SITE_OTHER): Payer: Medicare Other | Admitting: Physician Assistant

## 2019-09-09 ENCOUNTER — Other Ambulatory Visit: Payer: Self-pay

## 2019-09-09 ENCOUNTER — Ambulatory Visit (HOSPITAL_COMMUNITY)
Admission: RE | Admit: 2019-09-09 | Discharge: 2019-09-09 | Disposition: A | Discharge status: Home / Self Care | Payer: Medicare Other | Source: Ambulatory Visit | Attending: Surgery | Admitting: Surgery

## 2019-09-09 VITALS — BP 131/79 | HR 53 | Ht 70.0 in | Wt 189.7 lb

## 2019-09-09 DIAGNOSIS — I6523 Occlusion and stenosis of bilateral carotid arteries: Secondary | ICD-10-CM

## 2019-09-09 NOTE — Progress Notes (Signed)
Office Note     CC:  follow up Requesting Provider:  Maurice Small, MD  HPI: Patrick Vargas is a 79 y.o. (1940/08/16) male who presents for follow up of carotid artery disease. He has history of left carotid endarterectomy with Dacron patch done by Dr. Oneida Alar on 04/12/17 for asymptomatic left ICA stenosis of >80%. Prior to this he underwent a right carotid endarterectomy with Dacron patch on 03/07/27 by Dr. Oneida Alar for symptomatic right ICA stenosis of >80%. At the time he was having spots in his visual fields. Post operatively he did well. 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  He was last seen on 07/16/18 by NP. At the time he was not having any symptoms of TIA or stroke. He had resolution of some neuropraxia symptoms and speech difficulties.   Today he presents for follow up with carotid duplex. He denies any visual changes, slurred speech, facial drooping, weakness or numbness of upper or lower extremities.  He does not have any claudication symptoms, rest pain or non healing wounds. He remains very active working in yard and golfing  He did just have carpal tunnel surgery on right wrist 8 weeks ago. Healing well. Some soreness still in right wrist  The pt is on a statin for cholesterol management.  The pt is on a daily aspirin.   Other AC: none The pt  on CCB, HCTZ, ACE for hypertension.   The pt is not diabetic.  Tobacco hx:  Never smoker  Past Medical History:  Diagnosis Date  . Arthritis   . Carotid artery occlusion   . Dyslipidemia   . GERD (gastroesophageal reflux disease)   . Headache    ocular migraines  . History of kidney stones   . Peripheral vascular disease ([place])    Carotid stenosis right   . PVC's (premature ventricular contractions)   . Systemic hypertension   . 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 BUNIONECTOMY  1994   Left foot  .  CAROTID ENDARTERECTOMY    . CARPAL TUNNEL RELEASE  1999   Left hand  . ENDARTERECTOMY Right 03/06/2017   Procedure: ENDARTERECTOMY CAROTID RIGHT;  Surgeon: Elam Dutch, MD;  Location: [facility];  Service: Vascular;  Laterality: Right;  . ENDARTERECTOMY Left 04/12/2017   Procedure: ENDARTERECTOMY CAROTID LEFT;  Surgeon: Elam Dutch, MD;  Location: [place];  Service: Vascular;  Laterality: Left;  . PATCH ANGIOPLASTY Left 04/12/2017   Procedure: PATCH ANGIOPLASTY LEFT CAROTID ARTERY;  Surgeon: Elam Dutch, MD;  Location: [facility];  Service: Vascular;  Laterality: Left;  . TENDON REPAIR Right    right foot  . TONSILLECTOMY    . TOTAL KNEE ARTHROPLASTY  12/04/2010   Right  . TOTAL KNEE ARTHROPLASTY Left 03/19/2018   Procedure: TOTAL KNEE ARTHROPLASTY;  Surgeon: Dorna Leitz, MD;  Location: [facility];  Service: Orthopedics;  Laterality: Left;    Social History   Socioeconomic History  . Marital status: Married    Spouse name: Not on file  . Number of children: Not on file  . Years of education: Not on file  . Highest education level: Not on file  Occupational History  . Not on file  Tobacco Use  . Smoking status: Never Smoker  . Smokeless tobacco: Never Used  Vaping Use  . Vaping Use: Never used  Substance and Sexual Activity  . Alcohol use: Yes    Alcohol/week: 5.0 standard drinks    Types: 5 Standard drinks or equivalent per week  . Drug use: No  . Sexual activity: Not on file  Other Topics Concern  . Not on file  Social History Narrative  . Not on file   Social Determinants of Health   Financial Resource Strain:   . Difficulty of Paying Living Expenses:   Food Insecurity:   . Worried About Charity fundraiser in the Last Year:   . Arboriculturist in the Last Year:   Transportation Needs:   . Film/video editor (Medical):   Marland Kitchen Lack of Transportation (Non-Medical):   Physical Activity:   . Days of Exercise per Week:   . Minutes of Exercise per Session:     Stress:   . Feeling of Stress :   Social Connections:   . Frequency of Communication with Friends and Family:   . Frequency of Social Gatherings with Friends and Family:   . Attends Religious Services:   . Active Member of Clubs or Organizations:   . Attends Archivist Meetings:   Marland Kitchen Marital Status:   Intimate Partner Violence:   . Fear of Current or Ex-Partner:   . Emotionally Abused:   Marland Kitchen Physically Abused:   . Sexually Abused:     Family History  Problem Relation Age of Onset  . Peripheral Artery Disease Mother   . Stroke Father     Current Outpatient Medications  Medication Sig Dispense Refill  . acetaminophen (TYLENOL) 500 MG tablet Take 1,000 mg by mouth every 6 (six) hours as needed for moderate pain or headache.    Marland Kitchen amLODipine (NORVASC) 10 MG tablet Take 1 tablet (10 mg total) by mouth daily. 90 tablet 1  . aspirin EC 325 MG tablet Take 1 tablet (325 mg total) by mouth 2 (two) times daily after a meal. Take x 1 month post op to decrease risk of blood clots. (Patient not taking: Reported on 12/07/2018) 60 tablet 0  . aspirin EC 81 MG tablet Take 81 mg by mouth daily.    . CELEBREX 200 MG capsule Take 200 mg by mouth daily.     . cetirizine (ZYRTEC) 10 MG tablet Take 10 mg by mouth daily.     . Coenzyme Q10 200 MG capsule Take 200 mg by mouth daily.    Marland Kitchen docusate sodium (COLACE) 100 MG capsule Take 1 capsule (100 mg total) by mouth 2 (two) times daily. (Patient not taking: Reported on 12/07/2018) 30 capsule 0  . Evolocumab with Infusor ([place]) 420 MG/3.5ML SOCT Inject 3.5 mLs into the skin every 30 (thirty) days. 3.5 mL 11  . hydrochlorothiazide (HYDRODIURIL) 25 MG tablet Take 1 tablet (25 mg total) by mouth daily. 90 tablet 3  . HYDROcodone-acetaminophen (NORCO) 10-325 MG tablet Take 1 tablet by mouth every 4 (four) hours as needed for severe pain. (Patient not taking: Reported on 12/07/2018) 40 tablet 0  . lisinopril (ZESTRIL) 20 MG tablet  Take 1 tablet (20 mg total) by mouth daily. 90 tablet 2  . pravastatin (PRAVACHOL) 40 MG tablet Take 1 tablet (40 mg total) by mouth every evening. 90 tablet 1  . tiZANidine (ZANAFLEX) 2 MG tablet Take 1 tablet (2 mg total) by mouth every 8 (eight) hours as needed for muscle spasms. (Patient not taking: Reported on 12/07/2018) 40 tablet 0  . traMADol (ULTRAM) 50 MG tablet  Take 1 tablet (50 mg total) by mouth every 6 (six) hours as needed for moderate pain. (Patient not taking: Reported on 12/07/2018) 8 tablet 0   No current facility-administered medications for this visit.    Allergies  Allergen Reactions  . Amoxicillin Nausea Only and Other (See Comments)    Has patient had a PCN reaction causing immediate rash, facial/tongue/throat swelling, SOB or lightheadedness with hypotension: No Has patient had a PCN reaction causing severe rash involving mucus membranes or skin necrosis: No Has patient had a PCN reaction that required hospitalization: No Has patient had a PCN reaction occurring within the last 10 years: No If all of the above answers are "NO", then may proceed with Cephalosporin use.   Marland Kitchen Oxycodone Nausea Only  . Statins Other (See Comments)    Depression      REVIEW OF SYSTEMS:  [X]  denotes positive finding, [ ]  denotes negative finding Cardiac  Comments:  Chest pain or chest pressure:    Shortness of breath upon exertion:    Short of breath when lying flat:    Irregular heart rhythm:        Vascular    Pain in calf, thigh, or hip brought on by ambulation:    Pain in feet at night that wakes you up from your sleep:     Blood clot in your veins:    Leg swelling:         Pulmonary    Oxygen at home:    Productive cough:     Wheezing:         Neurologic    Sudden weakness in arms or legs:     Sudden numbness in arms or legs:     Sudden onset of difficulty speaking or slurred speech:    Temporary loss of vision in one eye:     Problems with dizziness:          Gastrointestinal    Blood in stool:     Vomited blood:         Genitourinary    Burning when urinating:     Blood in urine:        Psychiatric    Major depression:         Hematologic    Bleeding problems:    Problems with blood clotting too easily:        Skin    Rashes or ulcers:        Constitutional    Fever or chills:      PHYSICAL EXAMINATION:  Vitals:   09/09/19 1052 09/09/19 1053  BP: (!) 138/74 (!) 131/79  Pulse: 53   SpO2: 97%   Weight: 189 lb 11.2 oz (86 kg)   Height: 5\' 10"  (1.778 m)     General:  WDWN in NAD; vital signs documented above Gait: Normal HENT: WNL, normocephalic Pulmonary: normal non-labored breathing , without Rales, rhonchi,  wheezing Cardiac: regular HR, without  Murmurs without carotid bruit Abdomen: soft, NT, no masses Vascular Exam/Pulses:2+ radial pulses bilaterally, 2+ DP pulses bilaterally, Lower extremities well perfused and warm Extremities: without ischemic changes, without Gangrene , without cellulitis; without open wounds;  Musculoskeletal: no muscle wasting or atrophy  Neurologic: A&O X 3;  No focal weakness or paresthesias are detected Psychiatric:  The pt has Normal affect.   Non-Invasive Vascular Imaging:   09/09/19 Right Carotid: Velocities in the right ICA are consistent with a 1-39% stenosis. Patent CEA with hyperplasia.   Left Carotid: Velocities  in the left ICA are consistent with a 1-39% stenosis. Patent CEA with hyperplasia.   Vertebrals: Bilateral vertebral arteries demonstrate antegrade flow.  Subclavians: Normal flow hemodynamics were seen in bilateral subclavian arteries.    ASSESSMENT/PLAN:: 79 y.o. male here for follow up for carotid artery disease. He is s/p bilateral CEA. He had symptomatic right ICA stenosis but asymptomatic left ICA stenosis.  He had some post operative neuropraxia which resolved. He has remained asymptomatic. Carotid duplex today shows patent bilateral carotids with normal  vertebral and subclavian flow - He will continue his aspirin and statin - He will follow up sooner if he has any concerning symptoms and we discussed symptoms that should they occur he should seek immediate medical attention - He will follow up in 1 year with carotid duplex  Karoline Caldwell, PA-C [facility] [phone]  On call MD:  Dr. Oneida Alar

## 2019-10-01 ENCOUNTER — Other Ambulatory Visit: Payer: Self-pay | Admitting: Cardiovascular Disease

## 2019-10-16 DIAGNOSIS — Z7189 Other specified counseling: Secondary | ICD-10-CM | POA: Diagnosis not present

## 2019-10-16 DIAGNOSIS — L570 Actinic keratosis: Secondary | ICD-10-CM | POA: Diagnosis not present

## 2019-10-16 DIAGNOSIS — Z872 Personal history of diseases of the skin and subcutaneous tissue: Secondary | ICD-10-CM | POA: Diagnosis not present

## 2019-10-16 DIAGNOSIS — D229 Melanocytic nevi, unspecified: Secondary | ICD-10-CM | POA: Diagnosis not present

## 2019-10-16 DIAGNOSIS — L812 Freckles: Secondary | ICD-10-CM | POA: Diagnosis not present

## 2019-10-16 DIAGNOSIS — L578 Other skin changes due to chronic exposure to nonionizing radiation: Secondary | ICD-10-CM | POA: Diagnosis not present

## 2019-10-16 DIAGNOSIS — Z1283 Encounter for screening for malignant neoplasm of skin: Secondary | ICD-10-CM | POA: Diagnosis not present

## 2019-10-16 DIAGNOSIS — L821 Other seborrheic keratosis: Secondary | ICD-10-CM | POA: Diagnosis not present

## 2019-10-16 DIAGNOSIS — L814 Other melanin hyperpigmentation: Secondary | ICD-10-CM | POA: Diagnosis not present

## 2019-10-16 DIAGNOSIS — Z85828 Personal history of other malignant neoplasm of skin: Secondary | ICD-10-CM | POA: Diagnosis not present

## 2019-10-16 DIAGNOSIS — D1801 Hemangioma of skin and subcutaneous tissue: Secondary | ICD-10-CM | POA: Diagnosis not present

## 2019-11-11 DIAGNOSIS — Z23 Encounter for immunization: Secondary | ICD-10-CM | POA: Diagnosis not present

## 2019-11-22 DIAGNOSIS — M25512 Pain in left shoulder: Secondary | ICD-10-CM | POA: Diagnosis not present

## 2019-11-29 DIAGNOSIS — M25512 Pain in left shoulder: Secondary | ICD-10-CM | POA: Diagnosis not present

## 2019-12-12 DIAGNOSIS — Z96652 Presence of left artificial knee joint: Secondary | ICD-10-CM | POA: Diagnosis not present

## 2019-12-12 DIAGNOSIS — M25562 Pain in left knee: Secondary | ICD-10-CM | POA: Diagnosis not present

## 2019-12-12 DIAGNOSIS — M67912 Unspecified disorder of synovium and tendon, left shoulder: Secondary | ICD-10-CM | POA: Diagnosis not present

## 2019-12-13 ENCOUNTER — Other Ambulatory Visit: Payer: Self-pay | Admitting: Orthopedic Surgery

## 2019-12-13 DIAGNOSIS — M25512 Pain in left shoulder: Secondary | ICD-10-CM

## 2019-12-25 ENCOUNTER — Other Ambulatory Visit: Payer: Self-pay

## 2019-12-25 ENCOUNTER — Ambulatory Visit (INDEPENDENT_AMBULATORY_CARE_PROVIDER_SITE_OTHER): Payer: Medicare Other | Admitting: Cardiovascular Disease

## 2019-12-25 ENCOUNTER — Encounter: Payer: Self-pay | Admitting: Cardiovascular Disease

## 2019-12-25 VITALS — BP 136/78 | HR 79 | Ht 70.0 in | Wt 187.0 lb

## 2019-12-25 DIAGNOSIS — I10 Essential (primary) hypertension: Secondary | ICD-10-CM | POA: Diagnosis not present

## 2019-12-25 DIAGNOSIS — I6523 Occlusion and stenosis of bilateral carotid arteries: Secondary | ICD-10-CM | POA: Diagnosis not present

## 2019-12-25 DIAGNOSIS — Z9889 Other specified postprocedural states: Secondary | ICD-10-CM

## 2019-12-25 DIAGNOSIS — E663 Overweight: Secondary | ICD-10-CM | POA: Diagnosis not present

## 2019-12-25 DIAGNOSIS — E7801 Familial hypercholesterolemia: Secondary | ICD-10-CM | POA: Diagnosis not present

## 2019-12-25 DIAGNOSIS — I493 Ventricular premature depolarization: Secondary | ICD-10-CM

## 2019-12-25 NOTE — Progress Notes (Signed)
Cardiology Office Note    Date:  12/25/2019   ID:  [name], DOB 01/21/1941, MRN [id number]  PCP:  Maurice Small, MD  Cardiologist:   Sanda Klein, MD   Chief Complaint  Patient presents with  . Follow-up    12 months.  . Hyperlipidemia  . PAD    carotid disease    History of Present Illness:  Patrick Vargas is a 79 y.o. male with hypertension and hyperlipidemia and bilateral carotid endarterectomy in January 2019 (Dr. Oneida Alar) .  The patient specifically denies any chest pain at rest exertion, dyspnea at rest or with exertion, orthopnea, paroxysmal nocturnal dyspnea, syncope, palpitations, focal neurological deficits, intermittent claudication, lower extremity edema, unexplained weight gain, cough, hemoptysis or wheezing.  He is able to play 18 holes of golf and plans for bruits in his garden without cardiovascular complaints.  He is limited mostly by pain in his knees (bilateral knee replacements).  He had a normal echo in 2013 and normal nuclear stress tests in 2004 and 2007, but has never had coronary angiography.   He has long-standing severe hypercholesterolemia.  Despite compliance with pravastatin, his LDL cholesterol  was still very elevated at 151.  He did not tolerate more potent statins (he has tried simvastatin, rosuvastatin and atorvastatin).  After starting Repatha his follow-up LDL cholesterol was 54 in May 2019, a little higher at 33 in November 2019.  Recently has developed a bunch of aches and pains again and has actually cut back his pravastatin to every other day, with subsequent resolution of his complaints.  He wonders whether he still needs to take pravastatin since he is on the [place].  He is due to have repeat labs with Dr. Justin Mend in January.    Past Medical History:  Diagnosis Date  . Arthritis   . Carotid artery occlusion   . Dyslipidemia   . GERD (gastroesophageal reflux disease)   . Headache    ocular migraines  . History of kidney stones   .  Peripheral vascular disease ([place])    Carotid stenosis right   . PVC's (premature ventricular contractions)   . Systemic hypertension   . 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 BUNIONECTOMY  1994   Left foot  . CAROTID ENDARTERECTOMY    . CARPAL TUNNEL RELEASE  1999   Left hand  . ENDARTERECTOMY Right 03/06/2017   Procedure: ENDARTERECTOMY CAROTID RIGHT;  Surgeon: Elam Dutch, MD;  Location: [place];  Service: Vascular;  Laterality: Right;  . ENDARTERECTOMY Left 04/12/2017   Procedure: ENDARTERECTOMY CAROTID LEFT;  Surgeon: Elam Dutch, MD;  Location: [place];  Service: Vascular;  Laterality: Left;  . PATCH ANGIOPLASTY Left 04/12/2017   Procedure: PATCH ANGIOPLASTY LEFT CAROTID ARTERY;  Surgeon: Elam Dutch, MD;  Location: [facility];  Service: Vascular;  Laterality: Left;  . TENDON REPAIR Right    right foot  . TONSILLECTOMY    . TOTAL KNEE ARTHROPLASTY  12/04/2010   Right  . TOTAL KNEE ARTHROPLASTY Left 03/19/2018   Procedure: TOTAL KNEE ARTHROPLASTY;  Surgeon: Dorna Leitz, MD;  Location: [facility];  Service: Orthopedics;  Laterality: Left;    Current Medications: Outpatient Medications Prior to Visit  Medication Sig Dispense Refill  . acetaminophen (TYLENOL) 500 MG tablet Take 1,000 mg by mouth every 6 (six) hours as needed for moderate pain or headache.    Marland Kitchen amLODipine (NORVASC) 10  MG tablet Take 1 tablet (10 mg total) by mouth daily. 90 tablet 1  . aspirin EC 81 MG tablet Take 81 mg by mouth daily.    . CELEBREX 200 MG capsule Take 200 mg by mouth daily.     . cetirizine (ZYRTEC) 10 MG tablet Take 10 mg by mouth daily.     . Coenzyme Q10 200 MG capsule Take 200 mg by mouth daily.    . Evolocumab with Infusor ([place]) 420 MG/3.5ML SOCT Inject 3.5 mLs into the skin every 30 (thirty) days. 3.5 mL 11  . hydrochlorothiazide (HYDRODIURIL) 25 MG tablet Take 1 tablet (25 mg total) by mouth daily. 90  tablet 3  . lisinopril (ZESTRIL) 20 MG tablet TAKE 1 TABLET(20 MG) BY MOUTH DAILY 90 tablet 1  . pravastatin (PRAVACHOL) 40 MG tablet Take 1 tablet (40 mg total) by mouth every evening. 90 tablet 1  . sildenafil (REVATIO) 20 MG tablet SMARTSIG:2-5 Tablet(s) By Mouth PRN     No facility-administered medications prior to visit.     Allergies:   Amoxicillin, Oxycodone, and Statins   Social History   Socioeconomic History  . Marital status: Married    Spouse name: Not on file  . Number of children: Not on file  . Years of education: Not on file  . Highest education level: Not on file  Occupational History  . Not on file  Tobacco Use  . Smoking status: Never Smoker  . Smokeless tobacco: Never Used  Vaping Use  . Vaping Use: Never used  Substance and Sexual Activity  . Alcohol use: Yes    Alcohol/week: 5.0 standard drinks    Types: 5 Standard drinks or equivalent per week  . Drug use: No  . Sexual activity: Not on file  Other Topics Concern  . Not on file  Social History Narrative  . Not on file   Social Determinants of Health   Financial Resource Strain:   . Difficulty of Paying Living Expenses: Not on file  Food Insecurity:   . Worried About Charity fundraiser in the Last Year: Not on file  . Ran Out of Food in the Last Year: Not on file  Transportation Needs:   . Lack of Transportation (Medical): Not on file  . Lack of Transportation (Non-Medical): Not on file  Physical Activity:   . Days of Exercise per Week: Not on file  . Minutes of Exercise per Session: Not on file  Stress:   . Feeling of Stress : Not on file  Social Connections:   . Frequency of Communication with Friends and Family: Not on file  . Frequency of Social Gatherings with Friends and Family: Not on file  . Attends Religious Services: Not on file  . Active Member of Clubs or Organizations: Not on file  . Attends Archivist Meetings: Not on file  . Marital Status: Not on file       ROS:   Please see the history of present illness.    ROS All other systems are reviewed and are negative.  PHYSICAL EXAM:   VS:  BP 136/78 (BP Location: Left Arm, Patient Position: Sitting, Cuff Size: Normal)   Pulse 79   Ht 5\' 10"  (1.778 m)   Wt 187 lb (84.8 kg)   BMI 26.83 kg/m      General: Alert, oriented x3, no distress Head: no evidence of trauma, PERRL, EOMI, no exophtalmos or lid lag, no myxedema, no xanthelasma; normal ears,  nose and oropharynx Neck: normal jugular venous pulsations and no hepatojugular reflux; brisk carotid pulses without delay and no carotid bruits.  Well-healed endarterectomy scars. Chest: clear to auscultation, no signs of consolidation by percussion or palpation, normal fremitus, symmetrical and full respiratory excursions Cardiovascular: normal position and quality of the apical impulse, regular rhythm, normal first and second heart sounds, no murmurs, rubs or gallops Abdomen: no tenderness or distention, no masses by palpation, no abnormal pulsatility or arterial bruits, normal bowel sounds, no hepatosplenomegaly Extremities: no clubbing, cyanosis or edema; 2+ radial, ulnar and brachial pulses bilaterally; 2+ right femoral, posterior tibial and dorsalis pedis pulses; 2+ left femoral, posterior tibial and dorsalis pedis pulses; no subclavian or femoral bruits Neurological: grossly nonfocal Psych: Normal mood and affect   Wt Readings from Last 3 Encounters:  12/25/19 187 lb (84.8 kg)  09/09/19 189 lb 11.2 oz (86 kg)  12/07/18 185 lb (83.9 kg)      Studies/Labs Reviewed:   EKG:  EKG is ordered today.  It shows sinus rhythm with a single PVC and borderline left axis deviation, otherwise a completely normal tracing.  09/10/2019 duplex carotid ultrasound: Less than 39% stenosis bilaterally  Recent Labs: September 2018  Creatinine 1.33, hemoglobin 12.2, potassium 4.1  total cholesterol 223,Triglycerides 172, HDL 38, LDL 151  Normal liver  function tests  02/13/2019 Total cholesterol 112, HDL 41, LDL 53, triglycerides 100 Creatinine 1.51, potassium 4.0, normal liver function tests     Component Value Date/Time   CHOL 165 12/22/2017 0825   TRIG 180 (H) 12/22/2017 0825   HDL 43 12/22/2017 0825   CHOLHDL 3.8 12/22/2017 0825   CHOLHDL 5.7 09/16/2013 0832   VLDL 44 (H) 09/16/2013 0832   LDLCALC 86 12/22/2017 0825      ASSESSMENT:    1. Heterozygous familial hypercholesterolemia   2. History of bilateral carotid endarterectomy   3. Essential hypertension   4. Overweight (BMI 25.0-29.9)   5. Frequent PVCs      PLAN:  In order of problems listed above:  1. HLP: He probably has [place].  On combination pravastatin and Repatha his LDL cholesterol has been in the 50s on the last 2 assays.  I advised him to continue taking the every other day pravastatin and we will take a look at his labs in January.  If his LDL is still in the 50s we will try discontinuing the pravastatin, but I think he clearly needs both agents.  He did not tolerate more potent statins due to side effects.  Make sure he takes co-Q10 at least 300 mg daily. 2. S/P BILATERAL CEA for carotid stenosis: No interim neurological events.  To recent ultrasound in July with favorable findings. 3. HTN: Adequate control.  At her recent appointment systolic blood pressure was 130. 4. Overweight: Very mildly overweight. 5. PVCs: These are frequently seen on his ECGs but have always been asymptomatic.  No therapy indicated    Medication Adjustments/Labs and Tests Ordered: Current medicines are reviewed at length with the patient today.  Concerns regarding medicines are outlined above.  Medication changes, Labs and Tests ordered today are listed in the Patient Instructions below. Patient Instructions  Medication Instructions:  No changes *If you need a refill on your cardiac medications before your next appointment, please call your pharmacy*   Lab Work: None  ordered If you have labs (blood work) drawn today and your tests are completely normal, you will receive your results only by: Marland Kitchen MyChart Message (if you have  MyChart) OR . A paper copy in the mail If you have any lab test that is abnormal or we need to change your treatment, we will call you to review the results.   Testing/Procedures: None ordered   Follow-Up: At [facility], you and your health needs are our priority.  As part of our continuing mission to provide you with exceptional heart care, we have created designated Provider Care Teams.  These Care Teams include your primary Cardiologist (physician) and Advanced Practice Providers (APPs -  Physician Assistants and Nurse Practitioners) who all work together to provide you with the care you need, when you need it.  We recommend signing up for the patient portal called "MyChart".  Sign up information is provided on this After Visit Summary.  MyChart is used to connect with patients for Virtual Visits (Telemedicine).  Patients are able to view lab/test results, encounter notes, upcoming appointments, etc.  Non-urgent messages can be sent to your provider as well.   To learn more about what you can do with MyChart, go to [email or web address].    Your next appointment:   12 month(s)  The format for your next appointment:   In Person  Provider:   You may see Sanda Klein, MD or one of the following Advanced Practice Providers on your designated Care Team:    Almyra Deforest, PA-C  Fabian Sharp, [place] or   Roby Lofts, PA-C      Signed, Sanda Klein, MD  12/25/2019 6:01 PM    [facility] [place], [place], [place]  [address] Phone: [phone]; Fax: [phone]

## 2019-12-25 NOTE — Patient Instructions (Signed)

## 2020-01-04 ENCOUNTER — Ambulatory Visit
Admission: RE | Admit: 2020-01-04 | Discharge: 2020-01-04 | Disposition: A | Discharge status: Home / Self Care | Payer: Medicare Other | Source: Ambulatory Visit | Attending: Orthopedic Surgery | Admitting: Orthopedic Surgery

## 2020-01-04 ENCOUNTER — Other Ambulatory Visit: Payer: Self-pay | Admitting: Cardiovascular Disease

## 2020-01-04 DIAGNOSIS — M25512 Pain in left shoulder: Secondary | ICD-10-CM

## 2020-01-04 DIAGNOSIS — M75122 Complete rotator cuff tear or rupture of left shoulder, not specified as traumatic: Secondary | ICD-10-CM | POA: Diagnosis not present

## 2020-01-07 DIAGNOSIS — M75122 Complete rotator cuff tear or rupture of left shoulder, not specified as traumatic: Secondary | ICD-10-CM | POA: Diagnosis not present

## 2020-01-08 ENCOUNTER — Other Ambulatory Visit: Payer: Self-pay | Admitting: Cardiovascular Disease

## 2020-01-08 ENCOUNTER — Telehealth: Payer: Self-pay

## 2020-01-08 NOTE — Telephone Encounter (Signed)
   Primary Cardiologist: Dr. Sallyanne Kuster  Chart reviewed as part of pre-operative protocol coverage. Patient was recently seen by Dr. Sallyanne Kuster on 12/25/2019 at which time he was doing very well from a cardiac standpoint. No cardiac complaints and able to complete >4.0 METS without any problems.  Given past medical history and time since last visit, based on ACC/AHA guidelines, Patrick Vargas would be at acceptable risk for the planned procedure without further cardiovascular testing.   In regards to Aspirin, would defer recommendations for holding Aspirin to Vascular Surgery given history of bilateral carotid artery stenosis s/p bilateral carotid endarterectomy in 2019. He does not have any known CAD.   I will route this recommendation to the requesting party via Epic fax function and remove from pre-op pool.  Please call with questions.  Darreld Mclean, PA-C 01/08/2020, 3:37 PM

## 2020-01-08 NOTE — Telephone Encounter (Signed)
   [facility] Pre-operative Risk Assessment   Request for surgical clearance:  1. What type of surgery is being performed? Left shoulder arthroscopy with mini open rotator cuff repair  2. When is this surgery scheduled? 01/29/2020   3. What type of clearance is required (medical clearance vs. Pharmacy clearance to hold med vs. Both)? both  4. Are there any medications that need to be held prior to surgery and how long? aspirin  5. Practice name and name of physician performing surgery? Lowella Petties, MD / [facility]  6. What is the office phone number? [phone]   7.   What is the office fax number? [phone]  8.   Anesthesia type (None, local, MAC, general) ? choice   Sherrie Mustache 01/08/2020, 3:30 PM  _________________________________________________________________   (provider comments below)

## 2020-01-17 DIAGNOSIS — Z23 Encounter for immunization: Secondary | ICD-10-CM | POA: Diagnosis not present

## 2020-01-29 DIAGNOSIS — G8918 Other acute postprocedural pain: Secondary | ICD-10-CM | POA: Diagnosis not present

## 2020-01-29 DIAGNOSIS — X58XXXA Exposure to other specified factors, initial encounter: Secondary | ICD-10-CM | POA: Diagnosis not present

## 2020-01-29 DIAGNOSIS — Y999 Unspecified external cause status: Secondary | ICD-10-CM | POA: Diagnosis not present

## 2020-01-29 DIAGNOSIS — M7542 Impingement syndrome of left shoulder: Secondary | ICD-10-CM | POA: Diagnosis not present

## 2020-01-29 DIAGNOSIS — M19012 Primary osteoarthritis, left shoulder: Secondary | ICD-10-CM | POA: Diagnosis not present

## 2020-01-29 DIAGNOSIS — M7522 Bicipital tendinitis, left shoulder: Secondary | ICD-10-CM | POA: Diagnosis not present

## 2020-01-29 DIAGNOSIS — S46012A Strain of muscle(s) and tendon(s) of the rotator cuff of left shoulder, initial encounter: Secondary | ICD-10-CM | POA: Diagnosis not present

## 2020-02-03 DIAGNOSIS — R531 Weakness: Secondary | ICD-10-CM | POA: Diagnosis not present

## 2020-02-03 DIAGNOSIS — M25612 Stiffness of left shoulder, not elsewhere classified: Secondary | ICD-10-CM | POA: Diagnosis not present

## 2020-02-03 DIAGNOSIS — M75122 Complete rotator cuff tear or rupture of left shoulder, not specified as traumatic: Secondary | ICD-10-CM | POA: Diagnosis not present

## 2020-02-05 DIAGNOSIS — M25612 Stiffness of left shoulder, not elsewhere classified: Secondary | ICD-10-CM | POA: Diagnosis not present

## 2020-02-05 DIAGNOSIS — H35033 Hypertensive retinopathy, bilateral: Secondary | ICD-10-CM | POA: Diagnosis not present

## 2020-02-05 DIAGNOSIS — H25813 Combined forms of age-related cataract, bilateral: Secondary | ICD-10-CM | POA: Diagnosis not present

## 2020-02-05 DIAGNOSIS — H43813 Vitreous degeneration, bilateral: Secondary | ICD-10-CM | POA: Diagnosis not present

## 2020-02-05 DIAGNOSIS — R531 Weakness: Secondary | ICD-10-CM | POA: Diagnosis not present

## 2020-02-05 DIAGNOSIS — H3401 Transient retinal artery occlusion, right eye: Secondary | ICD-10-CM | POA: Diagnosis not present

## 2020-02-05 DIAGNOSIS — M75122 Complete rotator cuff tear or rupture of left shoulder, not specified as traumatic: Secondary | ICD-10-CM | POA: Diagnosis not present

## 2020-02-05 DIAGNOSIS — H04123 Dry eye syndrome of bilateral lacrimal glands: Secondary | ICD-10-CM | POA: Diagnosis not present

## 2020-02-10 DIAGNOSIS — M75122 Complete rotator cuff tear or rupture of left shoulder, not specified as traumatic: Secondary | ICD-10-CM | POA: Diagnosis not present

## 2020-02-10 DIAGNOSIS — M25612 Stiffness of left shoulder, not elsewhere classified: Secondary | ICD-10-CM | POA: Diagnosis not present

## 2020-02-10 DIAGNOSIS — R531 Weakness: Secondary | ICD-10-CM | POA: Diagnosis not present

## 2020-02-18 DIAGNOSIS — R531 Weakness: Secondary | ICD-10-CM | POA: Diagnosis not present

## 2020-02-18 DIAGNOSIS — M25612 Stiffness of left shoulder, not elsewhere classified: Secondary | ICD-10-CM | POA: Diagnosis not present

## 2020-02-18 DIAGNOSIS — M75122 Complete rotator cuff tear or rupture of left shoulder, not specified as traumatic: Secondary | ICD-10-CM | POA: Diagnosis not present

## 2020-02-21 DIAGNOSIS — M25612 Stiffness of left shoulder, not elsewhere classified: Secondary | ICD-10-CM | POA: Diagnosis not present

## 2020-02-21 DIAGNOSIS — M75122 Complete rotator cuff tear or rupture of left shoulder, not specified as traumatic: Secondary | ICD-10-CM | POA: Diagnosis not present

## 2020-02-21 DIAGNOSIS — R531 Weakness: Secondary | ICD-10-CM | POA: Diagnosis not present

## 2020-02-24 DIAGNOSIS — H2511 Age-related nuclear cataract, right eye: Secondary | ICD-10-CM | POA: Diagnosis not present

## 2020-02-24 DIAGNOSIS — H2513 Age-related nuclear cataract, bilateral: Secondary | ICD-10-CM | POA: Diagnosis not present

## 2020-02-25 DIAGNOSIS — M25612 Stiffness of left shoulder, not elsewhere classified: Secondary | ICD-10-CM | POA: Diagnosis not present

## 2020-02-25 DIAGNOSIS — R531 Weakness: Secondary | ICD-10-CM | POA: Diagnosis not present

## 2020-02-25 DIAGNOSIS — M75122 Complete rotator cuff tear or rupture of left shoulder, not specified as traumatic: Secondary | ICD-10-CM | POA: Diagnosis not present

## 2020-02-26 DIAGNOSIS — Z0000 Encounter for general adult medical examination without abnormal findings: Secondary | ICD-10-CM | POA: Diagnosis not present

## 2020-02-26 DIAGNOSIS — M75122 Complete rotator cuff tear or rupture of left shoulder, not specified as traumatic: Secondary | ICD-10-CM | POA: Diagnosis not present

## 2020-02-26 DIAGNOSIS — R531 Weakness: Secondary | ICD-10-CM | POA: Diagnosis not present

## 2020-02-26 DIAGNOSIS — M25612 Stiffness of left shoulder, not elsewhere classified: Secondary | ICD-10-CM | POA: Diagnosis not present

## 2020-03-03 DIAGNOSIS — M25612 Stiffness of left shoulder, not elsewhere classified: Secondary | ICD-10-CM | POA: Diagnosis not present

## 2020-03-03 DIAGNOSIS — R531 Weakness: Secondary | ICD-10-CM | POA: Diagnosis not present

## 2020-03-03 DIAGNOSIS — M75122 Complete rotator cuff tear or rupture of left shoulder, not specified as traumatic: Secondary | ICD-10-CM | POA: Diagnosis not present

## 2020-03-04 DIAGNOSIS — I10 Essential (primary) hypertension: Secondary | ICD-10-CM | POA: Diagnosis not present

## 2020-03-04 DIAGNOSIS — E785 Hyperlipidemia, unspecified: Secondary | ICD-10-CM | POA: Diagnosis not present

## 2020-03-05 DIAGNOSIS — M75122 Complete rotator cuff tear or rupture of left shoulder, not specified as traumatic: Secondary | ICD-10-CM | POA: Diagnosis not present

## 2020-03-05 DIAGNOSIS — M25612 Stiffness of left shoulder, not elsewhere classified: Secondary | ICD-10-CM | POA: Diagnosis not present

## 2020-03-05 DIAGNOSIS — R531 Weakness: Secondary | ICD-10-CM | POA: Diagnosis not present

## 2020-03-09 DIAGNOSIS — M75122 Complete rotator cuff tear or rupture of left shoulder, not specified as traumatic: Secondary | ICD-10-CM | POA: Diagnosis not present

## 2020-03-09 DIAGNOSIS — M25612 Stiffness of left shoulder, not elsewhere classified: Secondary | ICD-10-CM | POA: Diagnosis not present

## 2020-03-09 DIAGNOSIS — R531 Weakness: Secondary | ICD-10-CM | POA: Diagnosis not present

## 2020-03-13 DIAGNOSIS — M75122 Complete rotator cuff tear or rupture of left shoulder, not specified as traumatic: Secondary | ICD-10-CM | POA: Diagnosis not present

## 2020-03-13 DIAGNOSIS — M25612 Stiffness of left shoulder, not elsewhere classified: Secondary | ICD-10-CM | POA: Diagnosis not present

## 2020-03-13 DIAGNOSIS — R531 Weakness: Secondary | ICD-10-CM | POA: Diagnosis not present

## 2020-03-13 DIAGNOSIS — N179 Acute kidney failure, unspecified: Secondary | ICD-10-CM | POA: Diagnosis not present

## 2020-03-13 DIAGNOSIS — N183 Chronic kidney disease, stage 3 unspecified: Secondary | ICD-10-CM | POA: Diagnosis not present

## 2020-03-17 DIAGNOSIS — H268 Other specified cataract: Secondary | ICD-10-CM | POA: Diagnosis not present

## 2020-03-17 DIAGNOSIS — H25811 Combined forms of age-related cataract, right eye: Secondary | ICD-10-CM | POA: Diagnosis not present

## 2020-03-17 DIAGNOSIS — H2511 Age-related nuclear cataract, right eye: Secondary | ICD-10-CM | POA: Diagnosis not present

## 2020-03-23 DIAGNOSIS — R531 Weakness: Secondary | ICD-10-CM | POA: Diagnosis not present

## 2020-03-23 DIAGNOSIS — H2512 Age-related nuclear cataract, left eye: Secondary | ICD-10-CM | POA: Diagnosis not present

## 2020-03-23 DIAGNOSIS — M25612 Stiffness of left shoulder, not elsewhere classified: Secondary | ICD-10-CM | POA: Diagnosis not present

## 2020-03-23 DIAGNOSIS — M75122 Complete rotator cuff tear or rupture of left shoulder, not specified as traumatic: Secondary | ICD-10-CM | POA: Diagnosis not present

## 2020-03-24 DIAGNOSIS — I10 Essential (primary) hypertension: Secondary | ICD-10-CM | POA: Diagnosis not present

## 2020-03-26 DIAGNOSIS — I10 Essential (primary) hypertension: Secondary | ICD-10-CM | POA: Diagnosis not present

## 2020-03-30 DIAGNOSIS — R531 Weakness: Secondary | ICD-10-CM | POA: Diagnosis not present

## 2020-03-30 DIAGNOSIS — M75122 Complete rotator cuff tear or rupture of left shoulder, not specified as traumatic: Secondary | ICD-10-CM | POA: Diagnosis not present

## 2020-03-30 DIAGNOSIS — M25612 Stiffness of left shoulder, not elsewhere classified: Secondary | ICD-10-CM | POA: Diagnosis not present

## 2020-04-03 ENCOUNTER — Other Ambulatory Visit: Payer: Self-pay | Admitting: Cardiovascular Disease

## 2020-04-06 DIAGNOSIS — M75122 Complete rotator cuff tear or rupture of left shoulder, not specified as traumatic: Secondary | ICD-10-CM | POA: Diagnosis not present

## 2020-04-06 DIAGNOSIS — M25612 Stiffness of left shoulder, not elsewhere classified: Secondary | ICD-10-CM | POA: Diagnosis not present

## 2020-04-06 DIAGNOSIS — R531 Weakness: Secondary | ICD-10-CM | POA: Diagnosis not present

## 2020-04-07 DIAGNOSIS — H2512 Age-related nuclear cataract, left eye: Secondary | ICD-10-CM | POA: Diagnosis not present

## 2020-04-07 DIAGNOSIS — H25812 Combined forms of age-related cataract, left eye: Secondary | ICD-10-CM | POA: Diagnosis not present

## 2020-04-16 DIAGNOSIS — N183 Chronic kidney disease, stage 3 unspecified: Secondary | ICD-10-CM | POA: Diagnosis not present

## 2020-04-16 DIAGNOSIS — I129 Hypertensive chronic kidney disease with stage 1 through stage 4 chronic kidney disease, or unspecified chronic kidney disease: Secondary | ICD-10-CM | POA: Diagnosis not present

## 2020-04-16 DIAGNOSIS — I6529 Occlusion and stenosis of unspecified carotid artery: Secondary | ICD-10-CM | POA: Diagnosis not present

## 2020-04-16 DIAGNOSIS — M1990 Unspecified osteoarthritis, unspecified site: Secondary | ICD-10-CM | POA: Diagnosis not present

## 2020-04-16 DIAGNOSIS — D509 Iron deficiency anemia, unspecified: Secondary | ICD-10-CM | POA: Diagnosis not present

## 2020-04-16 DIAGNOSIS — E785 Hyperlipidemia, unspecified: Secondary | ICD-10-CM | POA: Diagnosis not present

## 2020-04-17 ENCOUNTER — Other Ambulatory Visit: Payer: Self-pay | Admitting: Internal Medicine

## 2020-04-17 DIAGNOSIS — N183 Chronic kidney disease, stage 3 unspecified: Secondary | ICD-10-CM

## 2020-05-04 ENCOUNTER — Other Ambulatory Visit: Payer: Self-pay

## 2020-05-04 MED ORDER — REPATHA PUSHTRONEX SYSTEM 420 MG/3.5ML ~~LOC~~ SOCT: 3.5000 mL | SUBCUTANEOUS | 11 refills | Status: DC

## 2020-05-11 ENCOUNTER — Ambulatory Visit
Admission: RE | Admit: 2020-05-11 | Discharge: 2020-05-11 | Disposition: A | Discharge status: Home / Self Care | Payer: Medicare Other | Source: Ambulatory Visit | Attending: Internal Medicine | Admitting: Internal Medicine

## 2020-05-11 DIAGNOSIS — N183 Chronic kidney disease, stage 3 unspecified: Secondary | ICD-10-CM

## 2020-05-11 DIAGNOSIS — N189 Chronic kidney disease, unspecified: Secondary | ICD-10-CM | POA: Diagnosis not present

## 2020-05-11 DIAGNOSIS — M25512 Pain in left shoulder: Secondary | ICD-10-CM | POA: Diagnosis not present

## 2020-05-11 DIAGNOSIS — M25612 Stiffness of left shoulder, not elsewhere classified: Secondary | ICD-10-CM | POA: Diagnosis not present

## 2020-05-11 DIAGNOSIS — Z4789 Encounter for other orthopedic aftercare: Secondary | ICD-10-CM | POA: Diagnosis not present

## 2020-05-29 DIAGNOSIS — M25512 Pain in left shoulder: Secondary | ICD-10-CM | POA: Diagnosis not present

## 2020-05-29 DIAGNOSIS — M75122 Complete rotator cuff tear or rupture of left shoulder, not specified as traumatic: Secondary | ICD-10-CM | POA: Diagnosis not present

## 2020-06-03 DIAGNOSIS — M25512 Pain in left shoulder: Secondary | ICD-10-CM | POA: Diagnosis not present

## 2020-06-03 DIAGNOSIS — M75122 Complete rotator cuff tear or rupture of left shoulder, not specified as traumatic: Secondary | ICD-10-CM | POA: Diagnosis not present

## 2020-06-17 DIAGNOSIS — M75122 Complete rotator cuff tear or rupture of left shoulder, not specified as traumatic: Secondary | ICD-10-CM | POA: Diagnosis not present

## 2020-06-17 DIAGNOSIS — M25512 Pain in left shoulder: Secondary | ICD-10-CM | POA: Diagnosis not present

## 2020-07-29 DIAGNOSIS — M19072 Primary osteoarthritis, left ankle and foot: Secondary | ICD-10-CM | POA: Diagnosis not present

## 2020-08-03 ENCOUNTER — Other Ambulatory Visit: Payer: Self-pay | Admitting: Student

## 2020-08-03 DIAGNOSIS — M79672 Pain in left foot: Secondary | ICD-10-CM

## 2020-08-03 DIAGNOSIS — R591 Generalized enlarged lymph nodes: Secondary | ICD-10-CM | POA: Diagnosis not present

## 2020-08-03 DIAGNOSIS — Z125 Encounter for screening for malignant neoplasm of prostate: Secondary | ICD-10-CM | POA: Diagnosis not present

## 2020-08-04 ENCOUNTER — Other Ambulatory Visit: Payer: Self-pay | Admitting: Family Medicine

## 2020-08-04 DIAGNOSIS — R591 Generalized enlarged lymph nodes: Secondary | ICD-10-CM

## 2020-08-14 DIAGNOSIS — U071 COVID-19: Secondary | ICD-10-CM | POA: Diagnosis not present

## 2020-08-14 DIAGNOSIS — N183 Chronic kidney disease, stage 3 unspecified: Secondary | ICD-10-CM | POA: Diagnosis not present

## 2020-08-26 ENCOUNTER — Ambulatory Visit
Admission: RE | Admit: 2020-08-26 | Discharge: 2020-08-26 | Disposition: A | Discharge status: Home / Self Care | Payer: Medicare Other | Source: Ambulatory Visit | Attending: Student | Admitting: Student

## 2020-08-26 ENCOUNTER — Other Ambulatory Visit: Payer: Self-pay

## 2020-08-26 DIAGNOSIS — M79672 Pain in left foot: Secondary | ICD-10-CM

## 2020-08-26 DIAGNOSIS — M19072 Primary osteoarthritis, left ankle and foot: Secondary | ICD-10-CM | POA: Diagnosis not present

## 2020-08-27 ENCOUNTER — Ambulatory Visit
Admission: RE | Admit: 2020-08-27 | Discharge: 2020-08-27 | Disposition: A | Discharge status: Home / Self Care | Payer: Medicare Other | Source: Ambulatory Visit | Attending: Family Medicine | Admitting: Family Medicine

## 2020-08-27 ENCOUNTER — Other Ambulatory Visit: Payer: Self-pay | Admitting: Family Medicine

## 2020-08-27 ENCOUNTER — Inpatient Hospital Stay: Admission: RE | Admit: 2020-08-27 | Payer: Medicare Other | Source: Ambulatory Visit

## 2020-08-27 DIAGNOSIS — R591 Generalized enlarged lymph nodes: Secondary | ICD-10-CM

## 2020-08-27 DIAGNOSIS — R609 Edema, unspecified: Secondary | ICD-10-CM

## 2020-08-27 DIAGNOSIS — N5089 Other specified disorders of the male genital organs: Secondary | ICD-10-CM | POA: Diagnosis not present

## 2020-08-27 DIAGNOSIS — R1909 Other intra-abdominal and pelvic swelling, mass and lump: Secondary | ICD-10-CM | POA: Diagnosis not present

## 2020-09-04 ENCOUNTER — Other Ambulatory Visit: Payer: Self-pay

## 2020-09-04 DIAGNOSIS — I6523 Occlusion and stenosis of bilateral carotid arteries: Secondary | ICD-10-CM

## 2020-09-10 ENCOUNTER — Ambulatory Visit (INDEPENDENT_AMBULATORY_CARE_PROVIDER_SITE_OTHER): Payer: Medicare Other | Admitting: Physician Assistant

## 2020-09-10 ENCOUNTER — Ambulatory Visit (HOSPITAL_COMMUNITY)
Admission: RE | Admit: 2020-09-10 | Discharge: 2020-09-10 | Disposition: A | Discharge status: Home / Self Care | Payer: Medicare Other | Source: Ambulatory Visit | Attending: Vascular Surgery | Admitting: Vascular Surgery

## 2020-09-10 ENCOUNTER — Other Ambulatory Visit: Payer: Self-pay

## 2020-09-10 VITALS — BP 107/70 | HR 77 | Temp 97.7°F | Resp 20 | Ht 70.0 in | Wt 176.2 lb

## 2020-09-10 DIAGNOSIS — I6523 Occlusion and stenosis of bilateral carotid arteries: Secondary | ICD-10-CM | POA: Diagnosis not present

## 2020-09-10 NOTE — Progress Notes (Signed)
HISTORY AND PHYSICAL     CC:  follow up. Requesting Provider:  Maurice Small, MD  HPI: This is a 80 y.o. male here for follow up for carotid artery stenosis.  Pt is s/p right CEA for symptomatic carotid artery stenosis on 03/06/2017 by Dr. Oneida Alar and subsequently had a left CEA on 04/12/2017 for asymptomatic carotid artery stenosis.  Post operatively he did well. 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  Pt was last seen 09/09/2019 and at that time and remained asymptomatic.  He did have resolution of his neuropraxia and speech symptoms.  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  His carotid duplex revealed 1-39% bilateral ICA stenosis.  Pt returns today for follow up.    Pt denies any amaurosis fugax, speech difficulties, weakness, numbness, paralysis or clumsiness or facial droop.    He states that he continues to be active.  He states he gets some cramps in his legs at night after he has played golf during the day.  He states that mustard helps and he has also started taking magnesium.  He does not get cramping in his legs while walking.    He spends his time between [facility] and [place].  He and his wife has been married for 55 years.    The pt is not on a statin for cholesterol management. He is on Repatha. The pt is on a daily aspirin.   Other AC:  none The pt is on CCB, ACEI, HCTZ for hypertension.   The pt is not diabetic.   Tobacco hx:  never  Pt does not have family hx of AAA.  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     Past Surgical History:  Procedure Laterality Date   BUNIONECTOMY  1994   Left foot   CAROTID ENDARTERECTOMY     CARPAL TUNNEL RELEASE  1999   Left hand   ENDARTERECTOMY Right 03/06/2017   Procedure: ENDARTERECTOMY CAROTID RIGHT;  Surgeon: Elam Dutch, MD;  Location: [place];  Service: Vascular;  Laterality: Right;   ENDARTERECTOMY Left 04/12/2017   Procedure: ENDARTERECTOMY CAROTID LEFT;  Surgeon: Elam Dutch, MD;  Location: [place];  Service: Vascular;  Laterality: Left;   PATCH ANGIOPLASTY Left 04/12/2017   Procedure: PATCH ANGIOPLASTY LEFT CAROTID ARTERY;  Surgeon: Elam Dutch, MD;  Location: [place];  Service: Vascular;  Laterality: Left;   TENDON REPAIR Right    right foot   TONSILLECTOMY     TOTAL KNEE ARTHROPLASTY  12/04/2010   Right   TOTAL KNEE ARTHROPLASTY Left 03/19/2018   Procedure: TOTAL KNEE ARTHROPLASTY;  Surgeon: Dorna Leitz, MD;  [facility]: [facility];  Service: Orthopedics;  Laterality: Left;    Allergies  Allergen Reactions   Amoxicillin Nausea Only and Other (See Comments)    Has patient had a PCN reaction causing immediate rash, facial/tongue/throat swelling, SOB or lightheadedness with hypotension: No Has patient had a PCN reaction causing severe  rash involving mucus membranes or skin necrosis: No Has patient had a PCN reaction that required hospitalization: No Has patient had a PCN reaction occurring within the last 10 years: No If all of the above answers are "NO", then may proceed with Cephalosporin use.    Oxycodone Nausea Only   Statins Other (See Comments)    Depression     Current Outpatient Medications  Medication Sig Dispense Refill   acetaminophen (TYLENOL) 500 MG tablet Take 1,000 mg by mouth every 6 (six) hours as needed for moderate pain or headache.     amLODipine (NORVASC) 10 MG tablet Take 1 tablet (10 mg total) by mouth daily. 90 tablet 1   aspirin EC 81 MG tablet Take 81 mg by mouth daily.     CELEBREX 200 MG capsule Take 200  mg by mouth daily.      cetirizine (ZYRTEC) 10 MG tablet Take 10 mg by mouth daily.      Coenzyme Q10 200 MG capsule Take 200 mg by mouth daily.     Evolocumab with Infusor ([place]) 420 MG/3.5ML SOCT Inject 3.5 mLs into the skin every 30 (thirty) days. 3.5 mL 11   hydrochlorothiazide (HYDRODIURIL) 25 MG tablet TAKE 1 TABLET(25 MG) BY MOUTH DAILY 90 tablet 3   lisinopril (ZESTRIL) 20 MG tablet TAKE 1 TABLET(20 MG) BY MOUTH DAILY 90 tablet 1   pravastatin (PRAVACHOL) 40 MG tablet Take 1 tablet (40 mg total) by mouth every evening. 90 tablet 1   sildenafil (REVATIO) 20 MG tablet SMARTSIG:2-5 Tablet(s) By Mouth PRN     No current facility-administered medications for this visit.    Family History  Problem Relation Age of Onset   Peripheral Artery Disease Mother    Stroke Father     Social History   Socioeconomic History   Marital status: Married    Spouse name: Not on file   Number of children: Not on file   Years of education: Not on file   Highest education level: Not on file  Occupational History   Not on file  Tobacco Use   Smoking status: Never   Smokeless tobacco: Never  Vaping Use   Vaping Use: Never used  Substance and Sexual Activity   Alcohol use: Yes    Alcohol/week: 5.0 standard drinks    Types: 5 Standard drinks or equivalent per week   Drug use: No   Sexual activity: Not on file  Other Topics Concern   Not on file  Social History Narrative   Not on file   Social Determinants of Health   Financial Resource Strain: Not on file  Food Insecurity: Not on file  Transportation Needs: Not on file  Physical Activity: Not on file  Stress: Not on file  Social Connections: Not on file  Intimate Partner Violence: Not on file     REVIEW OF SYSTEMS:   '[X]'$  denotes positive finding, '[ ]'$  denotes negative finding Cardiac  Comments:  Chest pain or chest pressure:    Shortness of breath upon exertion:    Short of breath when lying flat:     Irregular heart rhythm:        Vascular    Pain in calf, thigh, or hip brought on by ambulation:    Pain in feet at night that wakes you up from your sleep:     Blood clot in your veins:    Leg swelling:         Pulmonary  Oxygen at home:    Productive cough:     Wheezing:         Neurologic    Sudden weakness in arms or legs:     Sudden numbness in arms or legs:     Sudden onset of difficulty speaking or slurred speech:    Temporary loss of vision in one eye:     Problems with dizziness:         Gastrointestinal    Blood in stool:     Vomited blood:         Genitourinary    Burning when urinating:     Blood in urine:        Psychiatric    Major depression:         Hematologic    Bleeding problems:    Problems with blood clotting too easily:        Skin    Rashes or ulcers:        Constitutional    Fever or chills:      PHYSICAL EXAMINATION:  Today's Vitals   09/10/20 1130 09/10/20 1132  BP: 110/70 107/70  Pulse: 77   Resp: 20   Temp: 97.7 F (36.5 C)   TempSrc: Temporal   SpO2: 97%   Weight: 176 lb 3.2 oz (79.9 kg)   Height: '5\' 10"'$  (1.778 m)    Body mass index is 25.28 kg/m.   General:  WDWN in NAD; vital signs documented above Gait: Not observed HENT: WNL, normocephalic Pulmonary: normal non-labored breathing Cardiac: regular HR, without carotid bruits Abdomen: soft, NT; aortic pulse is not palpable Skin: without rashes Vascular Exam/Pulses:  Right Left  Radial 2+ (normal) 2+ (normal)  Femoral 2+ (normal) 2+ (normal0  Popliteal Unable to palpate Unable to palpate  DP 1+ (weak) Unable to palpate  PT Unable to palpate Unable to palpate   Extremities: without ischemic changes, without Gangrene , without cellulitis; without open wounds Musculoskeletal: no muscle wasting or atrophy  Neurologic: A&O X 3; moving all extremities equally; speech is fluent/normal Psychiatric:  The pt has Normal affect.   Non-Invasive Vascular Imaging:    Carotid Duplex on 09/10/2020: Right:  1-39% ICA stenosis Left:  1-39% ICA stenosis Vertebrals:  Bilateral vertebral arteries demonstrate antegrade flow.  Subclavians: Normal flow hemodynamics were seen in bilateral subclavian arteries.  Previous Carotid duplex on 09/09/2019: Right: 1-39% ICA stenosis Left:   1-39% ICA stenosis Vertebrals:  Bilateral vertebral arteries demonstrate antegrade flow.  Subclavians: Normal flow hemodynamics were seen in bilateral subclavian arteries.   ASSESSMENT/PLAN:: 80 y.o. male here for follow up carotid artery stenosis and is s/p right CEA for symptomatic carotid artery stenosis on 03/06/2017 by Dr. Oneida Alar and subsequently had a left CEA on 04/12/2017 for asymptomatic carotid artery stenosis.  -duplex today reveals 1-39% bilateral ICA stenosis and pt remains asymptomatic.  -discussed s/s of stroke with pt and he understands should he develop any of these sx, he will go to the nearest ER or call 911. -pt will f/u in one year with carotid duplex -pt will call sooner should they have any issues. -continue Repatha/asa   Leontine Locket, [facility] [phone]  Clinic MD:  Oneida Alar

## 2020-09-14 DIAGNOSIS — M19072 Primary osteoarthritis, left ankle and foot: Secondary | ICD-10-CM | POA: Diagnosis not present

## 2020-10-15 DIAGNOSIS — R3911 Hesitancy of micturition: Secondary | ICD-10-CM | POA: Diagnosis not present

## 2020-10-15 DIAGNOSIS — N401 Enlarged prostate with lower urinary tract symptoms: Secondary | ICD-10-CM | POA: Diagnosis not present

## 2020-10-15 DIAGNOSIS — R351 Nocturia: Secondary | ICD-10-CM | POA: Diagnosis not present

## 2020-10-15 DIAGNOSIS — N503 Cyst of epididymis: Secondary | ICD-10-CM | POA: Diagnosis not present

## 2020-10-15 DIAGNOSIS — R3912 Poor urinary stream: Secondary | ICD-10-CM | POA: Diagnosis not present

## 2020-10-20 DIAGNOSIS — M1990 Unspecified osteoarthritis, unspecified site: Secondary | ICD-10-CM | POA: Diagnosis not present

## 2020-10-20 DIAGNOSIS — E785 Hyperlipidemia, unspecified: Secondary | ICD-10-CM | POA: Diagnosis not present

## 2020-10-20 DIAGNOSIS — Z23 Encounter for immunization: Secondary | ICD-10-CM | POA: Diagnosis not present

## 2020-10-20 DIAGNOSIS — I6529 Occlusion and stenosis of unspecified carotid artery: Secondary | ICD-10-CM | POA: Diagnosis not present

## 2020-10-20 DIAGNOSIS — I129 Hypertensive chronic kidney disease with stage 1 through stage 4 chronic kidney disease, or unspecified chronic kidney disease: Secondary | ICD-10-CM | POA: Diagnosis not present

## 2020-10-20 DIAGNOSIS — N183 Chronic kidney disease, stage 3 unspecified: Secondary | ICD-10-CM | POA: Diagnosis not present

## 2020-11-11 DIAGNOSIS — D1801 Hemangioma of skin and subcutaneous tissue: Secondary | ICD-10-CM | POA: Diagnosis not present

## 2020-11-11 DIAGNOSIS — L821 Other seborrheic keratosis: Secondary | ICD-10-CM | POA: Diagnosis not present

## 2020-11-11 DIAGNOSIS — Z1283 Encounter for screening for malignant neoplasm of skin: Secondary | ICD-10-CM | POA: Diagnosis not present

## 2020-11-11 DIAGNOSIS — Z85828 Personal history of other malignant neoplasm of skin: Secondary | ICD-10-CM | POA: Diagnosis not present

## 2020-11-11 DIAGNOSIS — L812 Freckles: Secondary | ICD-10-CM | POA: Diagnosis not present

## 2020-11-11 DIAGNOSIS — L570 Actinic keratosis: Secondary | ICD-10-CM | POA: Diagnosis not present

## 2020-11-11 DIAGNOSIS — Z872 Personal history of diseases of the skin and subcutaneous tissue: Secondary | ICD-10-CM | POA: Diagnosis not present

## 2020-11-11 DIAGNOSIS — L578 Other skin changes due to chronic exposure to nonionizing radiation: Secondary | ICD-10-CM | POA: Diagnosis not present

## 2020-11-11 DIAGNOSIS — L814 Other melanin hyperpigmentation: Secondary | ICD-10-CM | POA: Diagnosis not present

## 2020-11-13 DIAGNOSIS — G5601 Carpal tunnel syndrome, right upper limb: Secondary | ICD-10-CM | POA: Diagnosis not present

## 2020-11-23 DIAGNOSIS — H43813 Vitreous degeneration, bilateral: Secondary | ICD-10-CM | POA: Diagnosis not present

## 2020-11-23 DIAGNOSIS — H35033 Hypertensive retinopathy, bilateral: Secondary | ICD-10-CM | POA: Diagnosis not present

## 2020-11-23 DIAGNOSIS — Z961 Presence of intraocular lens: Secondary | ICD-10-CM | POA: Diagnosis not present

## 2020-11-23 DIAGNOSIS — H3401 Transient retinal artery occlusion, right eye: Secondary | ICD-10-CM | POA: Diagnosis not present

## 2020-12-11 ENCOUNTER — Other Ambulatory Visit: Payer: Self-pay | Admitting: Cardiovascular Disease

## 2020-12-16 DIAGNOSIS — N401 Enlarged prostate with lower urinary tract symptoms: Secondary | ICD-10-CM | POA: Diagnosis not present

## 2020-12-16 DIAGNOSIS — R351 Nocturia: Secondary | ICD-10-CM | POA: Diagnosis not present

## 2020-12-16 DIAGNOSIS — R3912 Poor urinary stream: Secondary | ICD-10-CM | POA: Diagnosis not present

## 2020-12-16 DIAGNOSIS — N5201 Erectile dysfunction due to arterial insufficiency: Secondary | ICD-10-CM | POA: Diagnosis not present

## 2020-12-21 DIAGNOSIS — Z23 Encounter for immunization: Secondary | ICD-10-CM | POA: Diagnosis not present

## 2021-02-17 DIAGNOSIS — R051 Acute cough: Secondary | ICD-10-CM | POA: Diagnosis not present

## 2021-02-17 DIAGNOSIS — R0981 Nasal congestion: Secondary | ICD-10-CM | POA: Diagnosis not present

## 2021-02-17 DIAGNOSIS — J309 Allergic rhinitis, unspecified: Secondary | ICD-10-CM | POA: Diagnosis not present

## 2021-02-17 DIAGNOSIS — J019 Acute sinusitis, unspecified: Secondary | ICD-10-CM | POA: Diagnosis not present

## 2021-02-24 ENCOUNTER — Other Ambulatory Visit: Payer: Self-pay

## 2021-02-24 ENCOUNTER — Encounter: Payer: Self-pay | Admitting: Cardiovascular Disease

## 2021-02-24 ENCOUNTER — Ambulatory Visit (INDEPENDENT_AMBULATORY_CARE_PROVIDER_SITE_OTHER): Payer: Medicare Other | Admitting: Cardiovascular Disease

## 2021-02-24 VITALS — BP 118/66 | HR 76 | Ht 70.0 in | Wt 183.8 lb

## 2021-02-24 DIAGNOSIS — Z9889 Other specified postprocedural states: Secondary | ICD-10-CM

## 2021-02-24 DIAGNOSIS — I10 Essential (primary) hypertension: Secondary | ICD-10-CM | POA: Diagnosis not present

## 2021-02-24 DIAGNOSIS — T466X5A Adverse effect of antihyperlipidemic and antiarteriosclerotic drugs, initial encounter: Secondary | ICD-10-CM

## 2021-02-24 DIAGNOSIS — E663 Overweight: Secondary | ICD-10-CM

## 2021-02-24 DIAGNOSIS — G720 Drug-induced myopathy: Secondary | ICD-10-CM

## 2021-02-24 DIAGNOSIS — E7801 Familial hypercholesterolemia: Secondary | ICD-10-CM | POA: Diagnosis not present

## 2021-02-24 NOTE — Patient Instructions (Signed)

## 2021-02-24 NOTE — Progress Notes (Signed)
Cardiology Office Note    Date:  02/25/2021   ID:  Patrick Vargas, DOB Aug 18, 1940, MRN [id number]  PCP:  Maurice Small, MD  Cardiologist:   Sanda Klein, MD   Chief Complaint  Patient presents with   Follow-up    History of Present Illness:  Patrick Vargas is a 81 y.o. male with hypertension and hyperlipidemia and bilateral carotid endarterectomy in January 2019 (Dr. Oneida Alar) .  The patient specifically denies any chest pain at rest exertion, dyspnea at rest or with exertion, orthopnea, paroxysmal nocturnal dyspnea, syncope, palpitations, focal neurological deficits, intermittent claudication, lower extremity edema, unexplained weight gain, cough, hemoptysis or wheezing.  He was having some problems with aches and pains we will go also he stopped taking pravastatin.  His most recent lipid profile showed that his LDL has increased to 88.  When he was taking both pravastatin and Repatha the LDL  was consistently in the 50s.  He is not entirely sure, but he does think that stopping the statin improved his muscle aches.  His wife told me during her exam that she is concerned about his memory or also.  She states that he becomes angry when she confronted him about this.  Only on one occasion during our discussion today did I get an inkling that Patrick Vargas may have some issues with his memory.  He could not remember what type of procedure he had with a urologist earlier this year.  He had a normal echo in 2013 and normal nuclear stress tests in 2004 and 2007, but has never had coronary angiography.   He has long-standing severe hypercholesterolemia.  Despite compliance with pravastatin, his LDL cholesterol  was still very elevated at 151.  He did not tolerate more potent statins (he has tried simvastatin, rosuvastatin and atorvastatin).  After starting Repatha his follow-up LDL cholesterol was 54 in May 2019, a little higher at 1 in November 2019.   Past Medical History:  Diagnosis Date    Arthritis    Carotid artery occlusion    Dyslipidemia    GERD (gastroesophageal reflux disease)    Headache    ocular migraines   History of kidney stones    Peripheral vascular disease (HCC)    Carotid stenosis right    PVC's (premature ventricular contractions)    Systemic hypertension    Transient global amnesia    30 years ago    Past Surgical History:  Procedure Laterality Date   BUNIONECTOMY  1994   Left foot   CAROTID ENDARTERECTOMY     CARPAL TUNNEL RELEASE  1999   Left hand   ENDARTERECTOMY Right 03/06/2017   Procedure: ENDARTERECTOMY CAROTID RIGHT;  Surgeon: Elam Dutch, MD;  Location: [place];  Service: Vascular;  Laterality: Right;   ENDARTERECTOMY Left 04/12/2017   Procedure: ENDARTERECTOMY CAROTID LEFT;  Surgeon: Elam Dutch, MD;  Location: [place];  Service: Vascular;  Laterality: Left;   PATCH ANGIOPLASTY Left 04/12/2017   Procedure: PATCH ANGIOPLASTY LEFT CAROTID ARTERY;  Surgeon: Elam Dutch, MD;  Location: [facility];  Service: Vascular;  Laterality: Left;   TENDON REPAIR Right    right foot   TONSILLECTOMY     TOTAL KNEE ARTHROPLASTY  12/04/2010   Right   TOTAL KNEE ARTHROPLASTY Left 03/19/2018   Procedure: TOTAL KNEE ARTHROPLASTY;  Surgeon: Dorna Leitz, MD;  Location: [facility];  Service: Orthopedics;  Laterality: Left;    Current Medications: Outpatient Medications Prior to Visit  Medication Sig Dispense Refill  acetaminophen (TYLENOL) 500 MG tablet Take 1,000 mg by mouth every 6 (six) hours as needed for moderate pain or headache.     amLODipine (NORVASC) 10 MG tablet Take 1 tablet (10 mg total) by mouth daily. 90 tablet 1   aspirin EC 81 MG tablet Take 81 mg by mouth daily.     cetirizine (ZYRTEC) 10 MG tablet Take 10 mg by mouth daily.      Coenzyme Q10 200 MG capsule Take 200 mg by mouth daily.     Evolocumab with Infusor ([place]) 420 MG/3.5ML SOCT Inject 3.5 mLs into the skin every 30 (thirty) days. 3.5 mL 11    hydrochlorothiazide (HYDRODIURIL) 25 MG tablet TAKE 1 TABLET(25 MG) BY MOUTH DAILY 90 tablet 3   lisinopril (ZESTRIL) 20 MG tablet Take 1 tablet (20 mg total) by mouth daily. 90 tablet 1   sildenafil (REVATIO) 20 MG tablet SMARTSIG:2-5 Tablet(s) By Mouth PRN     traMADol (ULTRAM) 50 MG tablet 1-2 tablet as needed     CELEBREX 200 MG capsule Take 200 mg by mouth daily.  (Patient not taking: Reported on 02/24/2021)     pravastatin (PRAVACHOL) 40 MG tablet Take 1 tablet (40 mg total) by mouth every evening. (Patient not taking: Reported on 02/24/2021) 90 tablet 1   tamsulosin (FLOMAX) 0.4 MG CAPS capsule Take 0.4 mg by mouth daily.     No facility-administered medications prior to visit.     Allergies:   Amoxicillin, Oxycodone, and Statins   Social History   Socioeconomic History   Marital status: Married    Spouse name: Not on file   Number of children: Not on file   Years of education: Not on file   Highest education level: Not on file  Occupational History   Not on file  Tobacco Use   Smoking status: Never   Smokeless tobacco: Never  Vaping Use   Vaping Use: Never used  Substance and Sexual Activity   Alcohol use: Yes    Alcohol/week: 5.0 standard drinks    Types: 5 Standard drinks or equivalent per week   Drug use: No   Sexual activity: Not on file  Other Topics Concern   Not on file  Social History Narrative   Not on file   Social Determinants of Health   Financial Resource Strain: Not on file  Food Insecurity: Not on file  Transportation Needs: Not on file  Physical Activity: Not on file  Stress: Not on file  Social Connections: Not on file      ROS:   Please see the history of present illness.    ROS All other systems are reviewed and are negative.  PHYSICAL EXAM:   VS:  BP 118/66    Pulse 76    Ht 5\' 10"  (1.778 m)    Wt 183 lb 12.8 oz (83.4 kg)    BMI 26.37 kg/m       General: Alert, oriented x3, no distress, appears well, minimally overweight Head: no  evidence of trauma, PERRL, EOMI, no exophtalmos or lid lag, no myxedema, no xanthelasma; normal ears, nose and oropharynx Neck: normal jugular venous pulsations and no hepatojugular reflux; brisk carotid pulses without delay and no carotid bruits Chest: clear to auscultation, no signs of consolidation by percussion or palpation, normal fremitus, symmetrical and full respiratory excursions Cardiovascular: normal position and quality of the apical impulse, regular rhythm, normal first and second heart sounds, no murmurs, rubs or gallops Abdomen: no tenderness or  distention, no masses by palpation, no abnormal pulsatility or arterial bruits, normal bowel sounds, no hepatosplenomegaly Extremities: no clubbing, cyanosis or edema; 2+ radial, ulnar and brachial pulses bilaterally; 2+ right femoral, posterior tibial and dorsalis pedis pulses; 2+ left femoral, posterior tibial and dorsalis pedis pulses; no subclavian or femoral bruits Neurological: grossly nonfocal Psych: Normal mood and affect    Wt Readings from Last 3 Encounters:  02/24/21 183 lb 12.8 oz (83.4 kg)  09/10/20 176 lb 3.2 oz (79.9 kg)  12/25/19 187 lb (84.8 kg)      Studies/Labs Reviewed:   EKG:  EKG is ordered today.  It shows normal sinus rhythm and left axis deviation, not significantly changed from previous tracing.  09/10/2020 duplex carotid ultrasound: No change: less than 39% stenosis bilaterally  Recent Labs: September 2018  Creatinine 1.33, hemoglobin 12.2, potassium 4.1  total cholesterol 223,Triglycerides 172, HDL 38, LDL 151  Normal liver function tests  02/13/2019 Total cholesterol 112, HDL 41, LDL 53, triglycerides 100 Creatinine 1.51, potassium 4.0, normal liver function tests     Component Value Date/Time   CHOL 165 12/22/2017 0825   TRIG 180 (H) 12/22/2017 0825   HDL 43 12/22/2017 0825   CHOLHDL 3.8 12/22/2017 0825   CHOLHDL 5.7 09/16/2013 0832   VLDL 44 (H) 09/16/2013 0832   LDLCALC 86 12/22/2017  0825    03/04/2020 Cholesterol 155, HDL 38, LDL 88, triglycerides 168   10/20/2020 Creatinine 1.34, hemoglobin 13.7   ASSESSMENT:    1. Heterozygous familial hypercholesterolemia   2. Statin myopathy   3. History of bilateral carotid endarterectomy   4. Essential hypertension   5. Overweight (BMI 25.0-29.9)      PLAN:  In order of problems listed above:  HLP: He probably has [place].  On combination pravastatin and Repatha his LDL cholesterol has been in the 50s.  Without the statin, his LDL cholesterol is above target.  We will rechallenge him with pravastatin.  If he has recurrent myopathic symptoms, we can try ezetimibe instead of a statin. S/P BILATERAL CEA for carotid stenosis: No focal neurological events, no progression of disease on ultrasound in July. HTN: Well-controlled.   Overweight: Borderline  Possible memory issues: I think he should have a repeat Mini-Mental status examination with his primary care provider or neurology specialist.  I did not find convincing evidence of serious memory problems during our discussion today, but his wife is quite concerned about this.     Medication Adjustments/Labs and Tests Ordered: Current medicines are reviewed at length with the patient today.  Concerns regarding medicines are outlined above.  Medication changes, Labs and Tests ordered today are listed in the Patient Instructions below. Patient Instructions  Medication Instructions:  No changes *If you need a refill on your cardiac medications before your next appointment, please call your pharmacy*   Lab Work: None ordered If you have labs (blood work) drawn today and your tests are completely normal, you will receive your results only by: [place] (if you have MyChart) OR A paper copy in the mail If you have any lab test that is abnormal or we need to change your treatment, we will call you to review the results.   Testing/Procedures: None  ordered   Follow-Up: At [facility], you and your health needs are our priority.  As part of our continuing mission to provide you with exceptional heart care, we have created designated Provider Care Teams.  These Care Teams include your primary Cardiologist (physician) and Advanced  Practice Providers (APPs -  Physician Assistants and Nurse Practitioners) who all work together to provide you with the care you need, when you need it.  We recommend signing up for the patient portal called "MyChart".  Sign up information is provided on this After Visit Summary.  MyChart is used to connect with patients for Virtual Visits (Telemedicine).  Patients are able to view lab/test results, encounter notes, upcoming appointments, etc.  Non-urgent messages can be sent to your provider as well.   To learn more about what you can do with MyChart, go to [email or web address].    Your next appointment:   12 month(s)  The format for your next appointment:   In Person  Provider:   Sanda Klein, MD    Signed, Sanda Klein, MD  02/25/2021 1:50 PM    [address], [place], [place]  [address] Phone: [phone]; Fax: [phone]

## 2021-03-10 ENCOUNTER — Other Ambulatory Visit: Payer: Self-pay

## 2021-03-10 MED ORDER — HYDROCHLOROTHIAZIDE 25 MG PO TABS: ORAL_TABLET | ORAL | 3 refills | Status: DC

## 2021-03-12 DIAGNOSIS — J309 Allergic rhinitis, unspecified: Secondary | ICD-10-CM | POA: Diagnosis not present

## 2021-03-12 DIAGNOSIS — Z1159 Encounter for screening for other viral diseases: Secondary | ICD-10-CM | POA: Diagnosis not present

## 2021-03-12 DIAGNOSIS — I517 Cardiomegaly: Secondary | ICD-10-CM | POA: Diagnosis not present

## 2021-03-12 DIAGNOSIS — E785 Hyperlipidemia, unspecified: Secondary | ICD-10-CM | POA: Diagnosis not present

## 2021-03-12 DIAGNOSIS — N183 Chronic kidney disease, stage 3 unspecified: Secondary | ICD-10-CM | POA: Diagnosis not present

## 2021-03-12 DIAGNOSIS — Z9889 Other specified postprocedural states: Secondary | ICD-10-CM | POA: Diagnosis not present

## 2021-03-12 DIAGNOSIS — Z0000 Encounter for general adult medical examination without abnormal findings: Secondary | ICD-10-CM | POA: Diagnosis not present

## 2021-03-12 DIAGNOSIS — I10 Essential (primary) hypertension: Secondary | ICD-10-CM | POA: Diagnosis not present

## 2021-03-12 DIAGNOSIS — I6523 Occlusion and stenosis of bilateral carotid arteries: Secondary | ICD-10-CM | POA: Diagnosis not present

## 2021-03-12 DIAGNOSIS — Z79899 Other long term (current) drug therapy: Secondary | ICD-10-CM | POA: Diagnosis not present

## 2021-03-12 DIAGNOSIS — Z23 Encounter for immunization: Secondary | ICD-10-CM | POA: Diagnosis not present

## 2021-03-12 DIAGNOSIS — I340 Nonrheumatic mitral (valve) insufficiency: Secondary | ICD-10-CM | POA: Diagnosis not present

## 2021-03-16 DIAGNOSIS — K219 Gastro-esophageal reflux disease without esophagitis: Secondary | ICD-10-CM | POA: Diagnosis not present

## 2021-03-16 DIAGNOSIS — N183 Chronic kidney disease, stage 3 unspecified: Secondary | ICD-10-CM | POA: Diagnosis not present

## 2021-03-16 DIAGNOSIS — I10 Essential (primary) hypertension: Secondary | ICD-10-CM | POA: Diagnosis not present

## 2021-03-16 DIAGNOSIS — E785 Hyperlipidemia, unspecified: Secondary | ICD-10-CM | POA: Diagnosis not present

## 2021-04-12 DIAGNOSIS — N183 Chronic kidney disease, stage 3 unspecified: Secondary | ICD-10-CM | POA: Diagnosis not present

## 2021-04-21 DIAGNOSIS — N1831 Chronic kidney disease, stage 3a: Secondary | ICD-10-CM | POA: Diagnosis not present

## 2021-04-21 DIAGNOSIS — E785 Hyperlipidemia, unspecified: Secondary | ICD-10-CM | POA: Diagnosis not present

## 2021-04-21 DIAGNOSIS — K219 Gastro-esophageal reflux disease without esophagitis: Secondary | ICD-10-CM | POA: Diagnosis not present

## 2021-04-21 DIAGNOSIS — I129 Hypertensive chronic kidney disease with stage 1 through stage 4 chronic kidney disease, or unspecified chronic kidney disease: Secondary | ICD-10-CM | POA: Diagnosis not present

## 2021-04-21 DIAGNOSIS — M1990 Unspecified osteoarthritis, unspecified site: Secondary | ICD-10-CM | POA: Diagnosis not present

## 2021-04-28 ENCOUNTER — Other Ambulatory Visit: Payer: Self-pay

## 2021-04-28 MED ORDER — REPATHA PUSHTRONEX SYSTEM 420 MG/3.5ML ~~LOC~~ SOCT: 3.5000 mL | SUBCUTANEOUS | 11 refills | Status: DC

## 2021-05-29 ENCOUNTER — Other Ambulatory Visit: Payer: Self-pay | Admitting: Cardiovascular Disease

## 2021-06-18 DIAGNOSIS — E785 Hyperlipidemia, unspecified: Secondary | ICD-10-CM | POA: Diagnosis not present

## 2021-07-14 DIAGNOSIS — I10 Essential (primary) hypertension: Secondary | ICD-10-CM | POA: Diagnosis not present

## 2021-07-14 DIAGNOSIS — K219 Gastro-esophageal reflux disease without esophagitis: Secondary | ICD-10-CM | POA: Diagnosis not present

## 2021-07-14 DIAGNOSIS — E785 Hyperlipidemia, unspecified: Secondary | ICD-10-CM | POA: Diagnosis not present

## 2021-07-14 DIAGNOSIS — N183 Chronic kidney disease, stage 3 unspecified: Secondary | ICD-10-CM | POA: Diagnosis not present

## 2021-10-04 DIAGNOSIS — I10 Essential (primary) hypertension: Secondary | ICD-10-CM | POA: Diagnosis not present

## 2021-10-04 DIAGNOSIS — K219 Gastro-esophageal reflux disease without esophagitis: Secondary | ICD-10-CM | POA: Diagnosis not present

## 2021-10-04 DIAGNOSIS — N183 Chronic kidney disease, stage 3 unspecified: Secondary | ICD-10-CM | POA: Diagnosis not present

## 2021-10-04 DIAGNOSIS — E785 Hyperlipidemia, unspecified: Secondary | ICD-10-CM | POA: Diagnosis not present

## 2021-11-11 DIAGNOSIS — L812 Freckles: Secondary | ICD-10-CM | POA: Diagnosis not present

## 2021-11-11 DIAGNOSIS — I781 Nevus, non-neoplastic: Secondary | ICD-10-CM | POA: Diagnosis not present

## 2021-11-11 DIAGNOSIS — Z872 Personal history of diseases of the skin and subcutaneous tissue: Secondary | ICD-10-CM | POA: Diagnosis not present

## 2021-11-11 DIAGNOSIS — Z85828 Personal history of other malignant neoplasm of skin: Secondary | ICD-10-CM | POA: Diagnosis not present

## 2021-11-11 DIAGNOSIS — L578 Other skin changes due to chronic exposure to nonionizing radiation: Secondary | ICD-10-CM | POA: Diagnosis not present

## 2021-11-11 DIAGNOSIS — Z1283 Encounter for screening for malignant neoplasm of skin: Secondary | ICD-10-CM | POA: Diagnosis not present

## 2021-11-11 DIAGNOSIS — L821 Other seborrheic keratosis: Secondary | ICD-10-CM | POA: Diagnosis not present

## 2021-11-11 DIAGNOSIS — D1801 Hemangioma of skin and subcutaneous tissue: Secondary | ICD-10-CM | POA: Diagnosis not present

## 2021-11-11 DIAGNOSIS — L814 Other melanin hyperpigmentation: Secondary | ICD-10-CM | POA: Diagnosis not present

## 2021-11-11 DIAGNOSIS — L570 Actinic keratosis: Secondary | ICD-10-CM | POA: Diagnosis not present

## 2021-11-24 DIAGNOSIS — H43813 Vitreous degeneration, bilateral: Secondary | ICD-10-CM | POA: Diagnosis not present

## 2021-11-24 DIAGNOSIS — H04123 Dry eye syndrome of bilateral lacrimal glands: Secondary | ICD-10-CM | POA: Diagnosis not present

## 2021-11-24 DIAGNOSIS — H26492 Other secondary cataract, left eye: Secondary | ICD-10-CM | POA: Diagnosis not present

## 2021-11-24 DIAGNOSIS — H35033 Hypertensive retinopathy, bilateral: Secondary | ICD-10-CM | POA: Diagnosis not present

## 2021-11-26 DIAGNOSIS — R051 Acute cough: Secondary | ICD-10-CM | POA: Diagnosis not present

## 2021-12-16 DIAGNOSIS — N401 Enlarged prostate with lower urinary tract symptoms: Secondary | ICD-10-CM | POA: Diagnosis not present

## 2021-12-16 DIAGNOSIS — N5201 Erectile dysfunction due to arterial insufficiency: Secondary | ICD-10-CM | POA: Diagnosis not present

## 2021-12-16 DIAGNOSIS — R3912 Poor urinary stream: Secondary | ICD-10-CM | POA: Diagnosis not present

## 2021-12-22 DIAGNOSIS — R0982 Postnasal drip: Secondary | ICD-10-CM | POA: Diagnosis not present

## 2021-12-22 DIAGNOSIS — J310 Chronic rhinitis: Secondary | ICD-10-CM | POA: Diagnosis not present

## 2021-12-22 DIAGNOSIS — J343 Hypertrophy of nasal turbinates: Secondary | ICD-10-CM | POA: Diagnosis not present

## 2022-01-03 ENCOUNTER — Other Ambulatory Visit: Payer: Self-pay | Admitting: *Deleted

## 2022-01-03 DIAGNOSIS — I6523 Occlusion and stenosis of bilateral carotid arteries: Secondary | ICD-10-CM

## 2022-01-12 ENCOUNTER — Ambulatory Visit (HOSPITAL_COMMUNITY)
Admission: RE | Admit: 2022-01-12 | Discharge: 2022-01-12 | Disposition: A | Discharge status: Home / Self Care | Payer: Medicare Other | Source: Ambulatory Visit | Attending: Vascular Surgery | Admitting: Vascular Surgery

## 2022-01-12 ENCOUNTER — Ambulatory Visit (INDEPENDENT_AMBULATORY_CARE_PROVIDER_SITE_OTHER): Payer: Medicare Other | Admitting: Physician Assistant

## 2022-01-12 VITALS — BP 152/92 | HR 69 | Temp 98.0°F | Ht 70.0 in | Wt 178.0 lb

## 2022-01-12 DIAGNOSIS — I6523 Occlusion and stenosis of bilateral carotid arteries: Secondary | ICD-10-CM | POA: Insufficient documentation

## 2022-01-12 NOTE — Progress Notes (Signed)
Office Note     CC:  follow up Requesting Provider:  Maurice Small, MD  HPI: Patrick Vargas is a 81 y.o. (01/19/1941) male who presents for follow up of carotid artery stenosis. He has history of right CEA for symptomatic carotid artery stenosis on 03/06/2017 by Dr. Oneida Alar and subsequently had a left CEA on 04/12/2017 for asymptomatic carotid artery stenosis.  Post operatively he did well. 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 This has since resolved.  Pt denies any amaurosis fugax, speech difficulties, weakness, numbness, paralysis or clumsiness or facial droop.     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 He continued to get some cramps in his legs at night after he has played golf during the day.  No claudication, rest pain or tissue loss.   The pt is not on a statin for cholesterol management. He is on Repatha  The pt is on a daily aspirin.   Other AC:  none The pt is on CCB, ACEI, HCTZ for hypertension.   The pt is not diabetic.   Tobacco hx:  never  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    Past Surgical History:  Procedure Laterality Date   BUNIONECTOMY  1994   Left foot   CAROTID ENDARTERECTOMY     CARPAL TUNNEL RELEASE  1999   Left hand   ENDARTERECTOMY Right 03/06/2017   Procedure: ENDARTERECTOMY CAROTID RIGHT;  Surgeon: Elam Dutch, MD;  Location: [facility];  Service: Vascular;  Laterality: Right;   ENDARTERECTOMY Left 04/12/2017   Procedure: ENDARTERECTOMY CAROTID LEFT;  Surgeon: Elam Dutch, MD;  Location: [facility];  Service:  Vascular;  Laterality: Left;   PATCH ANGIOPLASTY Left 04/12/2017   Procedure: PATCH ANGIOPLASTY LEFT CAROTID ARTERY;  Surgeon: Elam Dutch, MD;  Location: [facility];  Service: Vascular;  Laterality: Left;   TENDON REPAIR Right    right foot   TONSILLECTOMY     TOTAL KNEE ARTHROPLASTY  12/04/2010   Right   TOTAL KNEE ARTHROPLASTY Left 03/19/2018   Procedure: TOTAL KNEE ARTHROPLASTY;  Surgeon: Dorna Leitz, MD;  Location: [facility];  Service: Orthopedics;  Laterality: Left;    Social History   Socioeconomic History   Marital status: Married    Spouse name: Not on file   Number of children: Not on file   Years of education: Not on file   Highest education level: Not on file  Occupational History   Not on file  Tobacco Use   Smoking status: Never   Smokeless tobacco: Never  Vaping Use   Vaping Use: Never used  Substance and Sexual Activity   Alcohol use: Yes    Alcohol/week: 5.0 standard drinks of alcohol    Types: 5 Standard drinks or equivalent per week   Drug use: No   Sexual activity: Not on file  Other Topics Concern   Not on file  Social History Narrative   Not on file   Social  Determinants of Health   Financial Resource Strain: Not on file  Food Insecurity: Not on file  Transportation Needs: Not on file  Physical Activity: Not on file  Stress: Not on file  Social Connections: Not on file  Intimate Partner Violence: Not on file    Family History  Problem Relation Age of Onset   Peripheral Artery Disease Mother    Stroke Father     Current Outpatient Medications  Medication Sig Dispense Refill   acetaminophen (TYLENOL) 500 MG tablet Take 1,000 mg by mouth every 6 (six) hours as needed for moderate pain or headache.     amLODipine (NORVASC) 10 MG tablet Take 1 tablet (10 mg total) by mouth daily. 90 tablet 1   aspirin EC 81 MG tablet Take 81 mg by mouth daily.     cetirizine (ZYRTEC) 10 MG tablet Take 10 mg by mouth daily.      Coenzyme Q10 200 MG capsule Take  200 mg by mouth daily.     Evolocumab with Infusor ([place]) 420 MG/3.5ML SOCT Inject 3.5 mLs into the skin every 30 (thirty) days. 3.5 mL 11   hydrochlorothiazide (HYDRODIURIL) 25 MG tablet TAKE 1 TABLET(25 MG) BY MOUTH DAILY 90 tablet 3   lisinopril (ZESTRIL) 20 MG tablet TAKE 1 TABLET(20 MG) BY MOUTH DAILY 90 tablet 3   sildenafil (REVATIO) 20 MG tablet SMARTSIG:2-5 Tablet(s) By Mouth PRN     No current facility-administered medications for this visit.    Allergies  Allergen Reactions   Amoxicillin Nausea Only and Other (See Comments)    Has patient had a PCN reaction causing immediate rash, facial/tongue/throat swelling, SOB or lightheadedness with hypotension: No Has patient had a PCN reaction causing severe rash involving mucus membranes or skin necrosis: No Has patient had a PCN reaction that required hospitalization: No Has patient had a PCN reaction occurring within the last 10 years: No If all of the above answers are "NO", then may proceed with Cephalosporin use.    Oxycodone Nausea Only   Statins Other (See Comments)    Depression      REVIEW OF SYSTEMS:  '[X]'$  denotes positive finding, '[ ]'$  denotes negative finding Cardiac  Comments:  Chest pain or chest pressure:    Shortness of breath upon exertion:    Short of breath when lying flat:    Irregular heart rhythm:        Vascular    Pain in calf, thigh, or hip brought on by ambulation:    Pain in feet at night that wakes you up from your sleep:     Blood clot in your veins:    Leg swelling:         Pulmonary    Oxygen at home:    Productive cough:     Wheezing:         Neurologic    Sudden weakness in arms or legs:     Sudden numbness in arms or legs:     Sudden onset of difficulty speaking or slurred speech:    Temporary loss of vision in one eye:     Problems with dizziness:         Gastrointestinal    Blood in stool:     Vomited blood:         Genitourinary    Burning when  urinating:     Blood in urine:        Psychiatric    Major depression:  Hematologic    Bleeding problems:    Problems with blood clotting too easily:        Skin    Rashes or ulcers:        Constitutional    Fever or chills:      PHYSICAL EXAMINATION:  Vitals:   01/12/22 0928 01/12/22 0930  BP: (!) 142/80 (!) 152/92  Pulse: 69   Temp: 98 F (36.7 C)   TempSrc: Temporal   SpO2: 99%   Weight: 178 lb (80.7 kg)   Height: '5\' 10"'$  (1.778 m)     General:  WDWN in NAD; vital signs documented above Gait: Normal HENT: WNL, normocephalic Pulmonary: normal non-labored breathing Cardiac:  HR, without  Murmurs without carotid bruit Vascular Exam/Pulses: 2+ radial pulses, 2+ Dp pulses bilaterally Extremities: without ischemic changes, without Gangrene , without cellulitis; without open wounds;  Musculoskeletal: no muscle wasting or atrophy  Neurologic: A&O X 3;  No focal weakness or paresthesias are detected Psychiatric:  The pt has Normal affect.   Non-Invasive Vascular Imaging:    Summary:  Right Carotid: Patent carotid endarterectomy site with velocity in the right ICA  consistent with a 1-39% stenosis.   Left Carotid: Patent carotid endarterectomy site with velocity in the right ICA consistent with a 1-39% stenosis.   Vertebrals:  Right vertebral artery demonstrates antegrade flow. Left vertebral artery is stenotic at the origin.  Subclavians: Normal flow hemodynamics were seen in bilateral subclavian arteries.    ASSESSMENT/PLAN:: 81 y.o. male here for follow up for carotid artery disease. He is s/p Right and Left CEA in 2019 by Dr. Oneida Alar.  He has no relatable symptoms to his carotid disease. He remains very active - Continue Aspirin and Repatha - reviewed signs and symptoms of TIA/ Stroke and he understands should this occur to seek immediate medical attention - he will follow up in 1 year with repeat carotid duplex   Karoline Caldwell, PA-C [facility] [phone]  Clinic MD:   Donzetta Matters

## 2022-02-08 DIAGNOSIS — Z23 Encounter for immunization: Secondary | ICD-10-CM | POA: Diagnosis not present

## 2022-03-03 DIAGNOSIS — J343 Hypertrophy of nasal turbinates: Secondary | ICD-10-CM | POA: Diagnosis not present

## 2022-03-03 DIAGNOSIS — R0982 Postnasal drip: Secondary | ICD-10-CM | POA: Diagnosis not present

## 2022-03-03 DIAGNOSIS — J310 Chronic rhinitis: Secondary | ICD-10-CM | POA: Diagnosis not present

## 2022-03-07 ENCOUNTER — Other Ambulatory Visit (HOSPITAL_COMMUNITY): Payer: Self-pay

## 2022-03-14 DIAGNOSIS — R2689 Other abnormalities of gait and mobility: Secondary | ICD-10-CM | POA: Diagnosis not present

## 2022-03-14 DIAGNOSIS — I10 Essential (primary) hypertension: Secondary | ICD-10-CM | POA: Diagnosis not present

## 2022-03-14 DIAGNOSIS — Z125 Encounter for screening for malignant neoplasm of prostate: Secondary | ICD-10-CM | POA: Diagnosis not present

## 2022-03-14 DIAGNOSIS — E785 Hyperlipidemia, unspecified: Secondary | ICD-10-CM | POA: Diagnosis not present

## 2022-03-14 DIAGNOSIS — N1831 Chronic kidney disease, stage 3a: Secondary | ICD-10-CM | POA: Diagnosis not present

## 2022-03-14 DIAGNOSIS — J309 Allergic rhinitis, unspecified: Secondary | ICD-10-CM | POA: Diagnosis not present

## 2022-03-14 DIAGNOSIS — Z1211 Encounter for screening for malignant neoplasm of colon: Secondary | ICD-10-CM | POA: Diagnosis not present

## 2022-03-14 DIAGNOSIS — Z0000 Encounter for general adult medical examination without abnormal findings: Secondary | ICD-10-CM | POA: Diagnosis not present

## 2022-04-12 DIAGNOSIS — N1831 Chronic kidney disease, stage 3a: Secondary | ICD-10-CM | POA: Diagnosis not present

## 2022-04-19 ENCOUNTER — Other Ambulatory Visit: Payer: Self-pay | Admitting: Pharmacist

## 2022-04-19 DIAGNOSIS — M1990 Unspecified osteoarthritis, unspecified site: Secondary | ICD-10-CM | POA: Diagnosis not present

## 2022-04-19 DIAGNOSIS — E785 Hyperlipidemia, unspecified: Secondary | ICD-10-CM | POA: Diagnosis not present

## 2022-04-19 DIAGNOSIS — N1831 Chronic kidney disease, stage 3a: Secondary | ICD-10-CM | POA: Diagnosis not present

## 2022-04-19 DIAGNOSIS — I129 Hypertensive chronic kidney disease with stage 1 through stage 4 chronic kidney disease, or unspecified chronic kidney disease: Secondary | ICD-10-CM | POA: Diagnosis not present

## 2022-04-19 MED ORDER — REPATHA PUSHTRONEX SYSTEM 420 MG/3.5ML ~~LOC~~ SOCT: 3.5000 mL | SUBCUTANEOUS | 11 refills | Status: DC

## 2022-04-30 ENCOUNTER — Other Ambulatory Visit: Payer: Self-pay | Admitting: Cardiovascular Disease

## 2022-06-01 DIAGNOSIS — R0982 Postnasal drip: Secondary | ICD-10-CM | POA: Diagnosis not present

## 2022-06-01 DIAGNOSIS — J343 Hypertrophy of nasal turbinates: Secondary | ICD-10-CM | POA: Diagnosis not present

## 2022-06-01 DIAGNOSIS — J310 Chronic rhinitis: Secondary | ICD-10-CM | POA: Diagnosis not present

## 2022-06-10 ENCOUNTER — Other Ambulatory Visit: Payer: Self-pay | Admitting: Cardiovascular Disease

## 2022-06-18 ENCOUNTER — Other Ambulatory Visit: Payer: Self-pay | Admitting: Cardiovascular Disease

## 2022-06-29 DIAGNOSIS — Z85828 Personal history of other malignant neoplasm of skin: Secondary | ICD-10-CM | POA: Diagnosis not present

## 2022-06-29 DIAGNOSIS — L089 Local infection of the skin and subcutaneous tissue, unspecified: Secondary | ICD-10-CM | POA: Diagnosis not present

## 2022-06-29 DIAGNOSIS — L814 Other melanin hyperpigmentation: Secondary | ICD-10-CM | POA: Diagnosis not present

## 2022-06-29 DIAGNOSIS — L570 Actinic keratosis: Secondary | ICD-10-CM | POA: Diagnosis not present

## 2022-06-29 DIAGNOSIS — L280 Lichen simplex chronicus: Secondary | ICD-10-CM | POA: Diagnosis not present

## 2022-07-21 ENCOUNTER — Other Ambulatory Visit: Payer: Self-pay | Admitting: Cardiovascular Disease

## 2022-08-15 ENCOUNTER — Other Ambulatory Visit: Payer: Self-pay | Admitting: *Deleted

## 2022-08-15 MED ORDER — LISINOPRIL 20 MG PO TABS: 20.0000 mg | ORAL_TABLET | Freq: Every day | ORAL | 0 refills | Status: DC

## 2022-08-22 DIAGNOSIS — U071 COVID-19: Secondary | ICD-10-CM | POA: Diagnosis not present

## 2022-08-22 DIAGNOSIS — Z6825 Body mass index (BMI) 25.0-25.9, adult: Secondary | ICD-10-CM | POA: Diagnosis not present

## 2022-08-22 DIAGNOSIS — I10 Essential (primary) hypertension: Secondary | ICD-10-CM | POA: Diagnosis not present

## 2022-08-22 DIAGNOSIS — M791 Myalgia, unspecified site: Secondary | ICD-10-CM | POA: Diagnosis not present

## 2022-09-19 ENCOUNTER — Telehealth: Payer: Self-pay | Admitting: Cardiovascular Disease

## 2022-09-19 DIAGNOSIS — E7801 Familial hypercholesterolemia: Secondary | ICD-10-CM

## 2022-09-19 DIAGNOSIS — I6523 Occlusion and stenosis of bilateral carotid arteries: Secondary | ICD-10-CM

## 2022-09-19 DIAGNOSIS — Z9889 Other specified postprocedural states: Secondary | ICD-10-CM

## 2022-09-19 NOTE — Telephone Encounter (Signed)
Pt c/o medication issue:  1. Name of Medication:   Evolocumab with Infusor (REPATHA PUSHTRONEX SYSTEM) 420 MG/3.5ML SOCT   2. How are you currently taking this medication (dosage and times per day)?   3. Are you having a reaction (difficulty breathing--STAT)?   4. What is your medication issue?   Patient stated he has been unable to get this medication since June and has not taken this medication in July and August.  Patient stated [facility] has not had this medication but they do have the pen with a lower dosage.  Patient wants to know next steps.

## 2022-09-19 NOTE — Telephone Encounter (Signed)
Left voicemail to return call to office.

## 2022-09-20 MED ORDER — REPATHA SURECLICK 140 MG/ML ~~LOC~~ SOAJ
1.0000 mL | SUBCUTANEOUS | 1 refills | Status: DC
Start: 2022-09-20 — End: 2023-05-22

## 2022-09-20 NOTE — Telephone Encounter (Signed)
Tried to call pt back. Phone was answered with a lot of static in the background. Asked for pt to call back. Below are the instructions via the pharmacy.   The Repatha infuser was discontinued by the manufacturer. Will send in Rx for the pens which are administered every 2 weeks (rather than once a month like the infuser). Directions on how to administer are included in the package but would be happy to explain to him over the phone as needed      Note

## 2022-09-20 NOTE — Telephone Encounter (Signed)
The Repatha infuser was discontinued by the manufacturer. Will send in Rx for the pens which are administered every 2 weeks (rather than once a month like the infuser). Directions on how to administer are included in the package but would be happy to explain to him over the phone as needed

## 2022-09-21 ENCOUNTER — Other Ambulatory Visit (HOSPITAL_BASED_OUTPATIENT_CLINIC_OR_DEPARTMENT_OTHER): Payer: Self-pay | Admitting: *Deleted

## 2022-09-21 MED ORDER — LISINOPRIL 20 MG PO TABS: 20.0000 mg | ORAL_TABLET | Freq: Every day | ORAL | 0 refills | Status: DC

## 2022-10-02 IMAGING — MR MR SHOULDER*L* W/O CM
4 of 5 series · 26 of 40 positions shown · non-contrast
Comparison: None.

CLINICAL DATA: Left shoulder pain with limited range of motion
since falling 6 weeks ago. No reported previous relevant surgery.

EXAM:
MRI OF THE LEFT SHOULDER WITHOUT CONTRAST
TECHNIQUE: Multiplanar, multisequence MR imaging of the shoulder was performed.
No intravenous contrast was administered.

[Series 5: T2 fat-sat · axial · left · 3.0mm · 0.59mm/px · z∈[-46,+54]mm · 11 of 27 slices shown (1 of 3)]
[im 1/27]
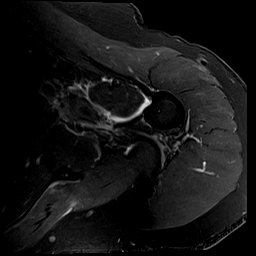
[im 3/27]
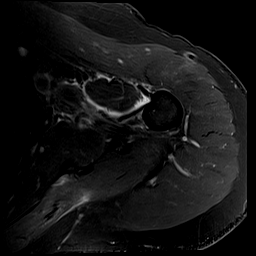
[im 6/27]
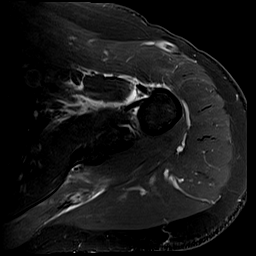
[im 8/27]
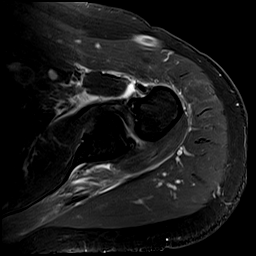
[im 11/27]
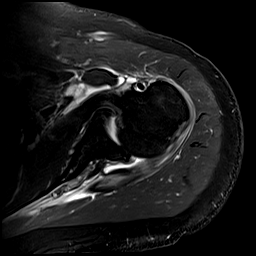
[im 14/27]
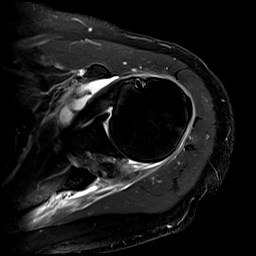
[im 16/27]
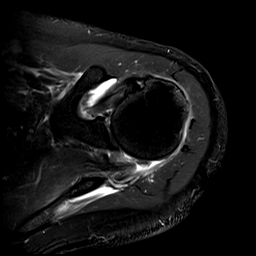
[im 19/27]
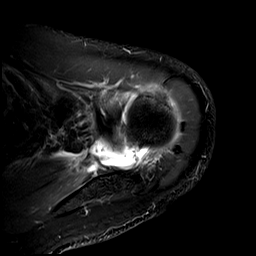
[im 21/27]
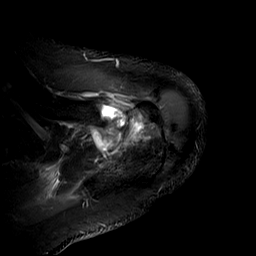
[im 24/27]
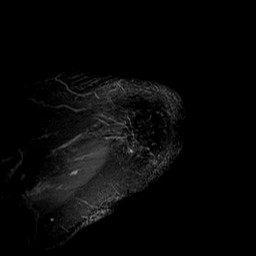
[im 27/27]
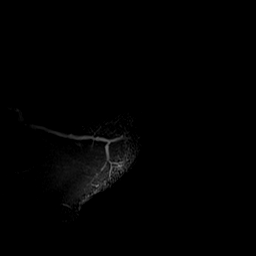

[Series 6: T2 fat-sat · coronal · left · 4.0mm · 0.22mm/px · 5 of 19 slices shown (2 of 3)]
[im 1/19]
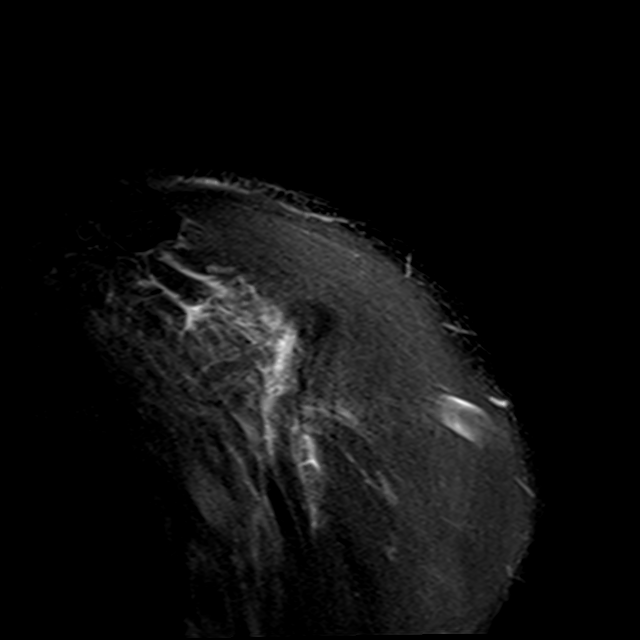
[im 3/19]
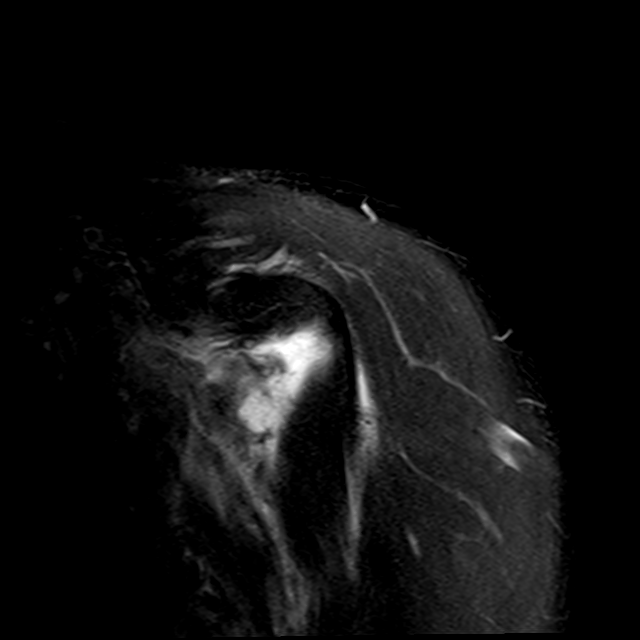
[im 6/19]
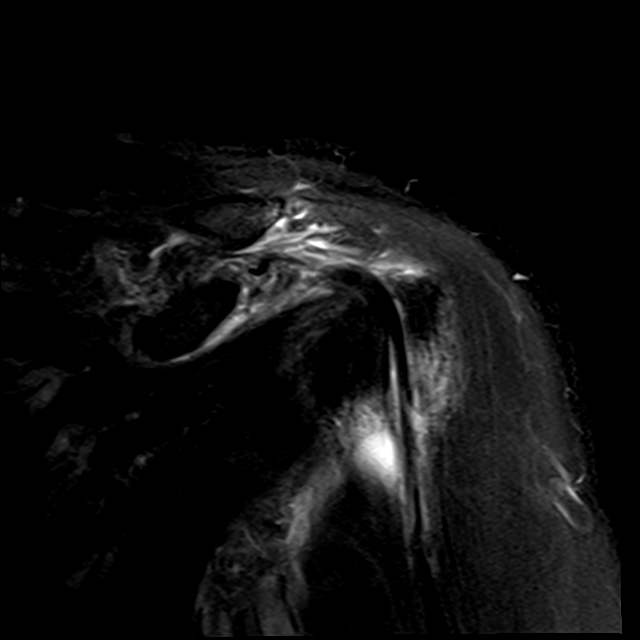
[im 11/19]
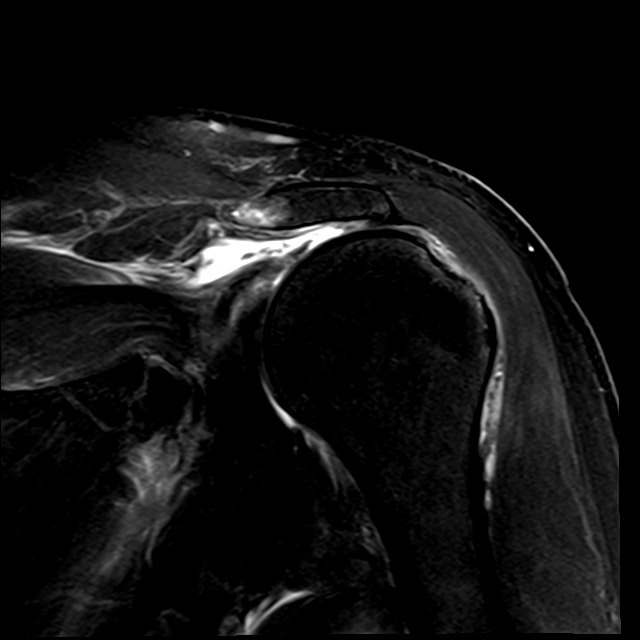
[im 16/19]
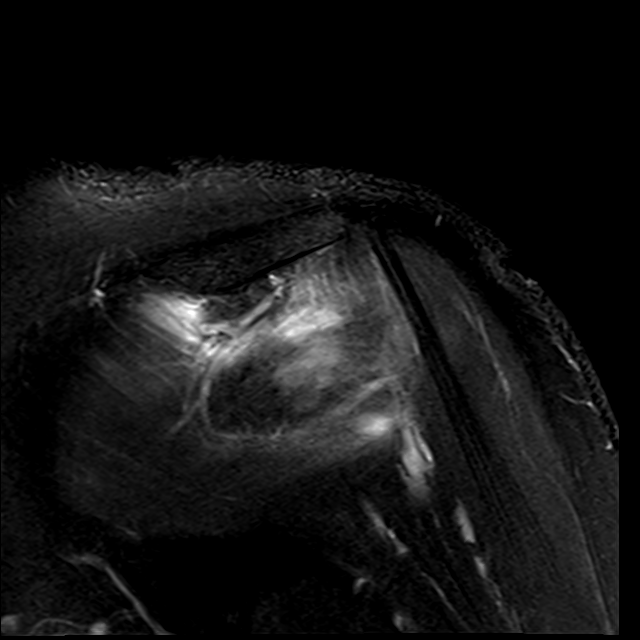

[Series 7: PD · coronal · left · 4.0mm · 0.27mm/px · 7 of 19 slices shown]
[im 1/19]
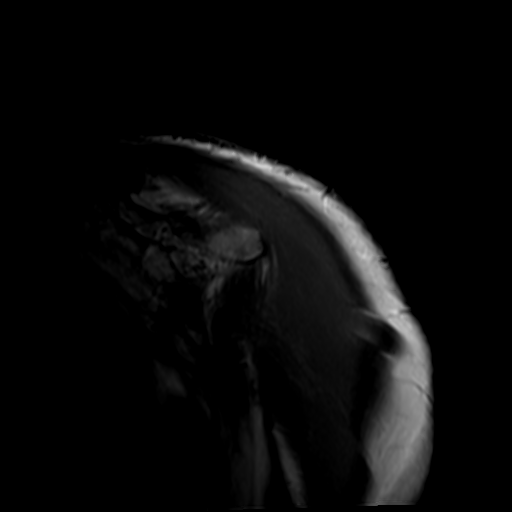
[im 4/19]
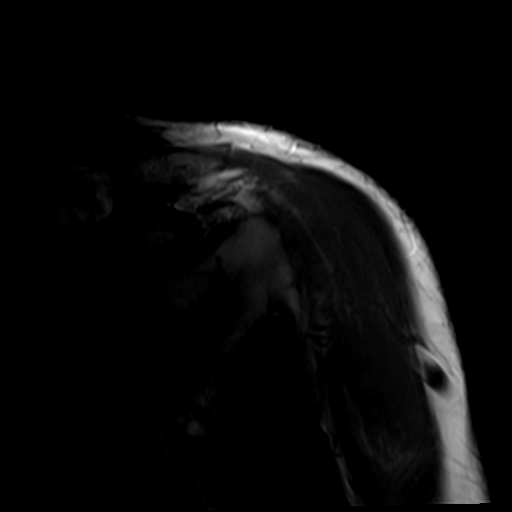
[im 7/19]
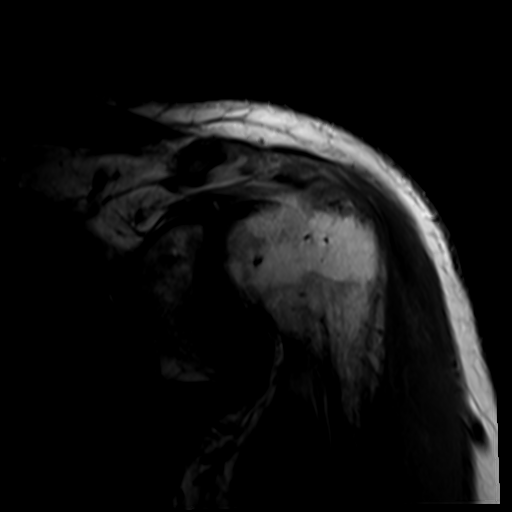
[im 10/19]
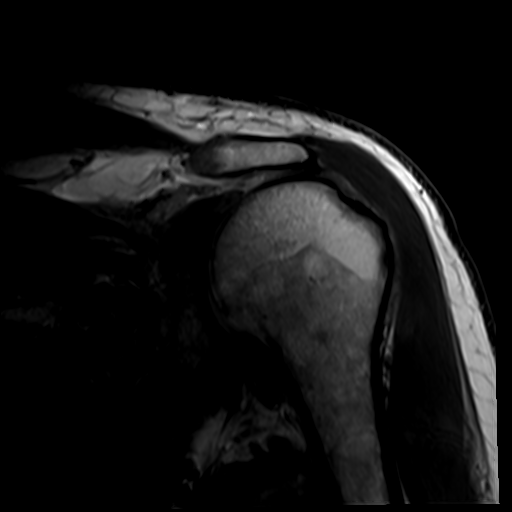
[im 13/19]
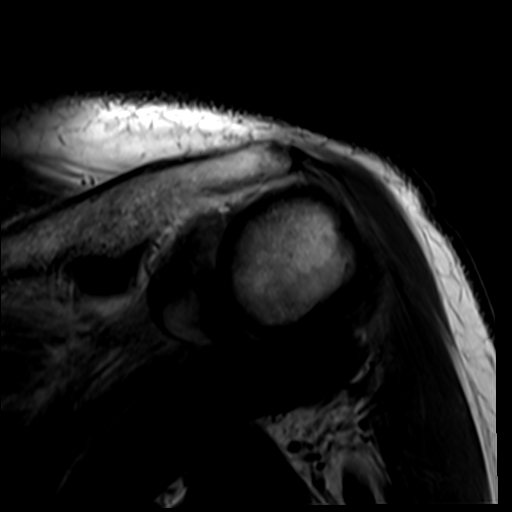
[im 16/19]
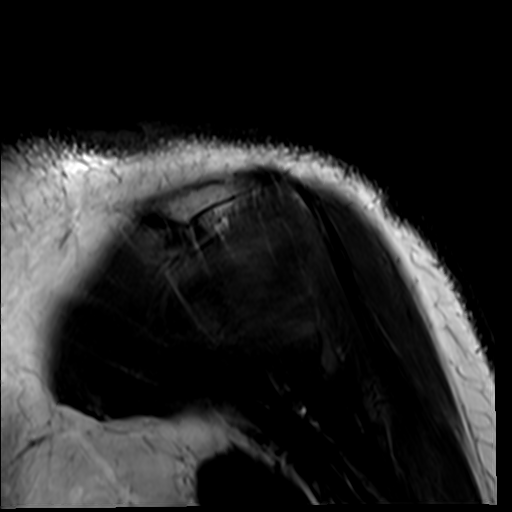
[im 19/19]
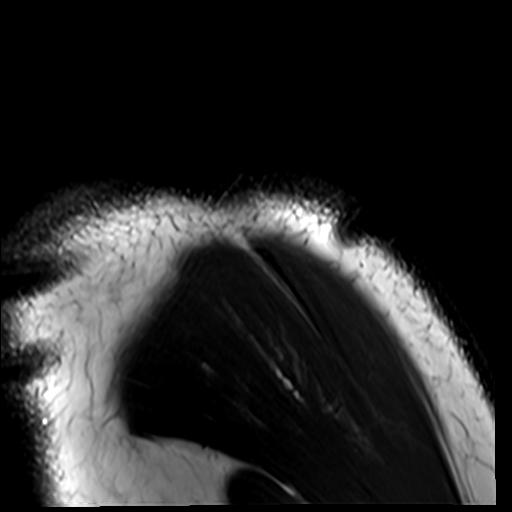

[Series 8: T2 fat-sat · sagittal · left · 4.0mm · 0.44mm/px · 3 of 19 slices shown (3 of 3)]
[im 4/19]
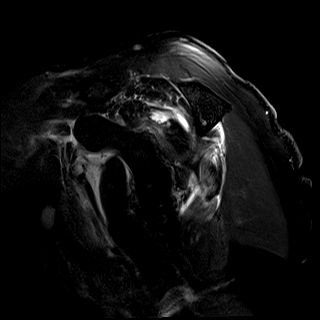
[im 10/19]
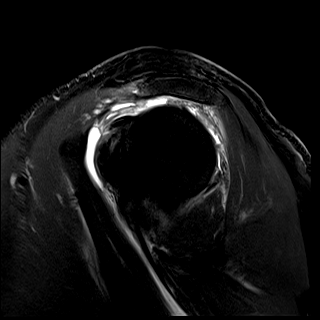
[im 16/19]
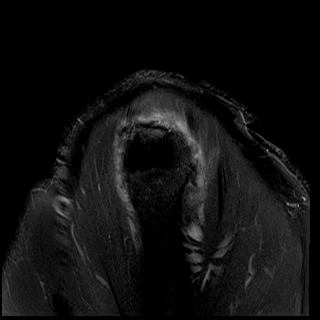

[26 of 40 positions shown; findings below may reference images not displayed]

FINDINGS: Rotator cuff: There is a large full-thickness rotator cuff tear with
complete insertional tears the supraspinatus and infraspinatus
tendons. Both tendons are retracted nearly to the level of glenoid.
The subscapularis and teres minor tendons appear intact.

Muscles: No significant muscular atrophy. There is moderate
infraspinatus and mild supraspinatus muscular edema, implying
relative acuity of the tears.

Biceps long head:  Intact and normally positioned.

Acromioclavicular Joint: The acromion is type 2. There are mild
acromioclavicular degenerative changes. Mild widening of the
acromioclavicular joint may relate to previous resection of the
distal clavicle. There is a small to moderate amount of fluid in the
subacromial-subdeltoid and subcoracoid bursa.

Glenohumeral Joint: Mild glenohumeral degenerative changes. No
significant shoulder joint effusion.

Labrum: Mild labral degeneration posteriorly without discrete tear
or paralabral cyst.

Bones: No acute or significant extra-articular osseous findings.

Other: Periarticular soft tissue edema without other focal fluid
collection.
IMPRESSION: 1. Large full-thickness rotator cuff tear involving the
supraspinatus and infraspinatus tendons. The tendons are retracted
nearly to the level of the glenoid. There is moderate infraspinatus
and mild supraspinatus muscular edema, implying relative acuity of
the tears.
2. The biceps tendon and labrum appear intact.
3. Mild glenohumeral and acromioclavicular degenerative changes.
Question previous distal clavicle resection.

## 2022-10-12 ENCOUNTER — Other Ambulatory Visit: Payer: Self-pay | Admitting: Cardiovascular Disease

## 2022-10-17 NOTE — Progress Notes (Unsigned)
   Cardiology Clinic Note   Date: 10/19/2022 ID: Patrick Vargas, DOB 12/14/1940, MRN [id number]  Primary Cardiologist:  Thurmon Fair, MD  Patient Profile    Patrick Vargas is a 82 y.o. male who presents to the clinic today for routine follow up.     Past medical history significant for: Carotid artery stenosis. Bilateral carotid endarterectomy January 2019. Carotid duplex 01/12/2022: Patent carotid endarterectomy site bilaterally with 1 to 39% ICA stenosis.  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 Hypertension. Hyperlipidemia. Lipid panel 03/14/2022: LDL 40, HDL 52, TG 143, total 117.     History of Present Illness    Patrick Vargas is a longtime patient of cardiology.  He is followed by Dr. Royann Shivers for the above outlined history.  Patient was last seen in the office by Dr. Royann Shivers on 02/24/2021 for routine follow-up.  Patient previously stopped pravastatin secondary to aches and pains but was uncertain if this actually helped.  Since stopping pravastatin LDL increased to above target.  He was rechallenged on pravastatin with plan to change to Zetia if he experienced myalgias again.  Note, his wife was concerned about possible memory issues.  It was suggested he undergo repeat Mini-Mental examination with PCP or neurology.  No convincing evidence of memory issues found during exam at the time of his visit.  Today, patient is accompanied by his wife. Patient denies shortness of breath or dyspnea on exertion. No chest pain, pressure, or tightness. Denies lower extremity edema, orthopnea, or PND. No palpitations.  He is followed by vascular surgery for carotid artery stenosis.  He denies lightheadedness, dizziness, presyncope, syncope, amaurosis fugax.     ROS: All other systems reviewed and are otherwise negative except as noted in History of Present Illness.  Studies Reviewed    EKG Interpretation Date/Time:  Wednesday October 19 2022 15:06:02 EDT Ventricular Rate:  66 PR  Interval:  116 QRS Duration:  84 QT Interval:  406 QTC Calculation: 425 R Axis:   -41  Text Interpretation: Normal sinus rhythm Left axis deviation When compared with ECG of 12-Mar-2018 11:13, No significant change was found Confirmed by Carlos Levering [phone]) on 10/19/2022 3:24:10 PM           Physical Exam    VS:  BP 108/62   Pulse 66   Ht 5\' 9"  (1.753 m)   Wt 179 lb (81.2 kg)   SpO2 99%   BMI 26.43 kg/m  , BMI Body mass index is 26.43 kg/m.  GEN: Well nourished, well developed, in no acute distress. Neck: No JVD or carotid bruits. Cardiac:  RRR. No murmurs. No rubs or gallops.   Respiratory:  Respirations regular and unlabored. Clear to auscultation without rales, wheezing or rhonchi. GI: Soft, nontender, nondistended. Extremities: Radials/DP/PT 2+ and equal bilaterally. No clubbing or cyanosis. No edema.  Skin: Warm and dry, no rash. Neuro: Strength intact.  Assessment & Plan    Hypertension: BP today 108/62.  Patient denies headaches, dizziness or vision changes. Continue amlodipine, hydrochlorothiazide, lisinopril. Hyperlipidemia.  LDL January 2024 40, at goal.  Continue rosuvastatin and Repatha. Carotid artery stenosis/history of of bilateral carotid endarterectomy January 2019.  Carotid duplex November 2023 showed patent carotid endarterectomy bilaterally.  Continue to follow with vascular surgery.  Disposition: Return in 1 year or sooner as needed.         Signed, Etta Grandchild. Salome Hautala, DNP, NP-C

## 2022-10-18 ENCOUNTER — Other Ambulatory Visit (HOSPITAL_BASED_OUTPATIENT_CLINIC_OR_DEPARTMENT_OTHER): Payer: Self-pay | Admitting: Cardiovascular Disease

## 2022-10-19 ENCOUNTER — Other Ambulatory Visit (HOSPITAL_BASED_OUTPATIENT_CLINIC_OR_DEPARTMENT_OTHER): Payer: Self-pay | Admitting: Cardiovascular Disease

## 2022-10-19 ENCOUNTER — Encounter: Payer: Self-pay | Admitting: Student

## 2022-10-19 ENCOUNTER — Ambulatory Visit: Payer: Medicare Other | Attending: Student | Admitting: Student

## 2022-10-19 VITALS — BP 108/62 | HR 66 | Ht 69.0 in | Wt 179.0 lb

## 2022-10-19 DIAGNOSIS — I10 Essential (primary) hypertension: Secondary | ICD-10-CM | POA: Insufficient documentation

## 2022-10-19 DIAGNOSIS — I6523 Occlusion and stenosis of bilateral carotid arteries: Secondary | ICD-10-CM | POA: Diagnosis not present

## 2022-10-19 DIAGNOSIS — Z9889 Other specified postprocedural states: Secondary | ICD-10-CM | POA: Insufficient documentation

## 2022-10-19 DIAGNOSIS — E7800 Pure hypercholesterolemia, unspecified: Secondary | ICD-10-CM | POA: Insufficient documentation

## 2022-10-19 NOTE — Patient Instructions (Signed)
Medication Instructions:  *If you need a refill on your cardiac medications before your next appointment, please call your pharmacy*   Lab Work: If you have labs (blood work) drawn today and your tests are completely normal, you will receive your results only by: MyChart Message (if you have MyChart) OR A paper copy in the mail If you have any lab test that is abnormal or we need to change your treatment, we will call you to review the results.   Follow-Up: At [facility], you and your health needs are our priority.  As part of our continuing mission to provide you with exceptional heart care, we have created designated Provider Care Teams.  These Care Teams include your primary Cardiologist (physician) and Advanced Practice Providers (APPs -  Physician Assistants and Nurse Practitioners) who all work together to provide you with the care you need, when you need it.  We recommend signing up for the patient portal called "MyChart".  Sign up information is provided on this After Visit Summary.  MyChart is used to connect with patients for Virtual Visits (Telemedicine).  Patients are able to view lab/test results, encounter notes, upcoming appointments, etc.  Non-urgent messages can be sent to your provider as well.   To learn more about what you can do with MyChart, go to [email or web address].    Your next appointment:   1 year(s)  Provider:   Dr. Royann Shivers

## 2022-11-14 ENCOUNTER — Other Ambulatory Visit: Payer: Self-pay | Admitting: Cardiovascular Disease

## 2022-11-23 ENCOUNTER — Other Ambulatory Visit: Payer: Self-pay | Admitting: Nurse Practitioner

## 2022-11-23 DIAGNOSIS — R1310 Dysphagia, unspecified: Secondary | ICD-10-CM | POA: Diagnosis not present

## 2022-11-23 DIAGNOSIS — R634 Abnormal weight loss: Secondary | ICD-10-CM | POA: Diagnosis not present

## 2022-11-23 DIAGNOSIS — R194 Change in bowel habit: Secondary | ICD-10-CM | POA: Diagnosis not present

## 2022-11-23 DIAGNOSIS — I6523 Occlusion and stenosis of bilateral carotid arteries: Secondary | ICD-10-CM | POA: Diagnosis not present

## 2022-11-23 DIAGNOSIS — K625 Hemorrhage of anus and rectum: Secondary | ICD-10-CM | POA: Diagnosis not present

## 2022-11-23 DIAGNOSIS — R197 Diarrhea, unspecified: Secondary | ICD-10-CM | POA: Diagnosis not present

## 2022-11-28 ENCOUNTER — Ambulatory Visit
Admission: RE | Admit: 2022-11-28 | Discharge: 2022-11-28 | Disposition: A | Discharge status: Home / Self Care | Payer: Medicare Other | Source: Ambulatory Visit | Attending: Nurse Practitioner | Admitting: Nurse Practitioner

## 2022-11-28 DIAGNOSIS — K5730 Diverticulosis of large intestine without perforation or abscess without bleeding: Secondary | ICD-10-CM | POA: Diagnosis not present

## 2022-11-28 DIAGNOSIS — R197 Diarrhea, unspecified: Secondary | ICD-10-CM | POA: Diagnosis not present

## 2022-11-28 DIAGNOSIS — R634 Abnormal weight loss: Secondary | ICD-10-CM

## 2022-11-28 DIAGNOSIS — I700 Atherosclerosis of aorta: Secondary | ICD-10-CM | POA: Diagnosis not present

## 2022-11-28 DIAGNOSIS — R1030 Lower abdominal pain, unspecified: Secondary | ICD-10-CM | POA: Diagnosis not present

## 2022-11-28 MED ORDER — IOPAMIDOL (ISOVUE-300) INJECTION 61%
100.0000 mL | Freq: Once | INTRAVENOUS | Status: AC | PRN
  Administered 2022-11-28: 100 mL via INTRAVENOUS

## 2022-11-30 ENCOUNTER — Telehealth: Payer: Self-pay | Admitting: *Deleted

## 2022-11-30 DIAGNOSIS — R197 Diarrhea, unspecified: Secondary | ICD-10-CM | POA: Diagnosis not present

## 2022-11-30 NOTE — Telephone Encounter (Signed)
Pre-operative Risk Assessment    Patient Name: Patrick Vargas  DOB: 1940-03-17 MRN: [id number]  DATE OF LAST VISIT: 10/19/22 Carlos Levering, NP DATE OF NEXT VISIT: NONE    Request for Surgical Clearance    Procedure:   ENDOSCOPY/COLONOSCOPY ; BRIGHT RED RECTAL BLEEDING, UNINTENTIONAL WEIGHT LOSS, DYSPHAGIA  Date of Surgery:  Clearance 12/06/22  STAT                               Surgeon:  DR. Matthias Hughs Surgeon's Group or Practice Name:  [facility] Phone number:  [phone] Fax number:  [phone]   Type of Clearance Requested:   - Medical ; NONE INDICATED NEEDING TO BE HELD   Type of Anesthesia:   PROPOFOL   Additional requests/questions:    Elpidio Anis   11/30/2022, 10:26 AM

## 2022-11-30 NOTE — Telephone Encounter (Signed)
Name: [name]  DOB: [birth year]  MRN: [id number]   Primary Cardiologist: Thurmon Fair, MD  Chart reviewed as part of pre-operative protocol coverage. Patrick Vargas was last seen on 10/19/2022 by me.  He was doing well at that time with no concerning symptoms or complaints. He is very active doing heavy yard work and golfing 1-2 times a week.   Therefore, based on ACC/AHA guidelines, the patient would be at acceptable risk for the planned procedure without further cardiovascular testing.   I will route this recommendation to the requesting party via Epic fax function and remove from pre-op pool. Please call with questions.  Carlos Levering, NP 11/30/2022, 1:59 PM

## 2022-12-06 DIAGNOSIS — K219 Gastro-esophageal reflux disease without esophagitis: Secondary | ICD-10-CM | POA: Diagnosis not present

## 2022-12-06 DIAGNOSIS — K5730 Diverticulosis of large intestine without perforation or abscess without bleeding: Secondary | ICD-10-CM | POA: Diagnosis not present

## 2022-12-06 DIAGNOSIS — R1310 Dysphagia, unspecified: Secondary | ICD-10-CM | POA: Diagnosis not present

## 2022-12-06 DIAGNOSIS — R197 Diarrhea, unspecified: Secondary | ICD-10-CM | POA: Diagnosis not present

## 2022-12-06 DIAGNOSIS — K625 Hemorrhage of anus and rectum: Secondary | ICD-10-CM | POA: Diagnosis not present

## 2022-12-06 DIAGNOSIS — K649 Unspecified hemorrhoids: Secondary | ICD-10-CM | POA: Diagnosis not present

## 2022-12-06 DIAGNOSIS — R194 Change in bowel habit: Secondary | ICD-10-CM | POA: Diagnosis not present

## 2022-12-08 DIAGNOSIS — R197 Diarrhea, unspecified: Secondary | ICD-10-CM | POA: Diagnosis not present

## 2022-12-08 DIAGNOSIS — K219 Gastro-esophageal reflux disease without esophagitis: Secondary | ICD-10-CM | POA: Diagnosis not present

## 2022-12-22 DIAGNOSIS — N5201 Erectile dysfunction due to arterial insufficiency: Secondary | ICD-10-CM | POA: Diagnosis not present

## 2022-12-28 ENCOUNTER — Other Ambulatory Visit: Payer: Self-pay | Admitting: *Deleted

## 2022-12-28 DIAGNOSIS — I6523 Occlusion and stenosis of bilateral carotid arteries: Secondary | ICD-10-CM

## 2023-01-16 ENCOUNTER — Ambulatory Visit (INDEPENDENT_AMBULATORY_CARE_PROVIDER_SITE_OTHER): Payer: Medicare Other | Admitting: Physician Assistant

## 2023-01-16 ENCOUNTER — Ambulatory Visit (HOSPITAL_COMMUNITY)
Admission: RE | Admit: 2023-01-16 | Discharge: 2023-01-16 | Disposition: A | Discharge status: Home / Self Care | Payer: Medicare Other | Source: Ambulatory Visit | Attending: Surgery | Admitting: Surgery

## 2023-01-16 VITALS — BP 130/77 | HR 62 | Temp 98.0°F | Ht 69.0 in | Wt 179.6 lb

## 2023-01-16 DIAGNOSIS — I6523 Occlusion and stenosis of bilateral carotid arteries: Secondary | ICD-10-CM | POA: Insufficient documentation

## 2023-01-16 NOTE — Progress Notes (Unsigned)
HISTORY AND PHYSICAL     CC:  follow up. Requesting Provider:  Camie Patience, FNP  HPI: This is a 82 y.o. male here for follow up for carotid artery stenosis.  Pt is s/p right CEA for symptomatic carotid artery stenosis on 03/06/2017 by Dr. Darrick Penna and subsequently had a left CEA on 04/12/2017 for asymptomatic carotid artery stenosis.  Post operatively he did well. 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  This has since resolved.   Pt was last seen 01/12/2022 and at that time he was without neurological sx.  Hew was continuing to be very active.  He did get some cramps in his legs at night after playing golf during the day.   Pt returns today for follow up.    Pt *** any amaurosis fugax, speech difficulties, weakness, numbness, paralysis or clumsiness or facial droop.    ***  The pt is on a statin for cholesterol management.  The pt is on a daily aspirin.   Other AC:  none The pt is on CCB, ACEI, diuretic for hypertension.   The pt is not on medication for diabetes Tobacco hx:  never  Pt does not have family hx of AAA.  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    Past Surgical History:  Procedure Laterality Date   BUNIONECTOMY  1994   Left foot   CAROTID ENDARTERECTOMY     CARPAL TUNNEL RELEASE  1999   Left hand   ENDARTERECTOMY Right 03/06/2017   Procedure: ENDARTERECTOMY CAROTID RIGHT;  Surgeon: Sherren Kerns, MD;  Location: [facility] OR;  Service: Vascular;  Laterality: Right;   ENDARTERECTOMY Left 04/12/2017   Procedure: ENDARTERECTOMY CAROTID LEFT;  Surgeon: Sherren Kerns, MD;  Location: [facility] OR;  Service: Vascular;  Laterality: Left;   PATCH ANGIOPLASTY Left 04/12/2017   Procedure: PATCH ANGIOPLASTY LEFT CAROTID ARTERY;  Surgeon: Sherren Kerns, MD;  Location: [facility] OR;  Service: Vascular;  Laterality: Left;   TENDON REPAIR Right    right foot   TONSILLECTOMY     TOTAL KNEE ARTHROPLASTY  12/04/2010   Right   TOTAL KNEE ARTHROPLASTY Left 03/19/2018   Procedure: TOTAL KNEE ARTHROPLASTY;  Surgeon: Jodi Geralds, MD;  Location: [facility] ORS;  Service: Orthopedics;  Laterality: Left;    Allergies  Allergen Reactions   Amoxicillin Nausea Only and Other (See Comments)    Has patient had a PCN reaction causing immediate rash, facial/tongue/throat swelling, SOB or lightheadedness with hypotension: No Has patient had a PCN reaction causing severe rash involving mucus membranes or skin necrosis: No Has patient had a PCN reaction that required hospitalization: No Has patient had a PCN reaction occurring within the last 10 years: No If all of the above answers are "NO", then may proceed with Cephalosporin use.    Oxycodone Nausea Only   Statins Other (See Comments)    Depression     Current Outpatient Medications  Medication Sig Dispense Refill   acetaminophen (TYLENOL) 500 MG tablet Take 1,000  mg by mouth every 6 (six) hours as needed for moderate pain or headache.     amLODipine (NORVASC) 10 MG tablet Take 1 tablet (10 mg total) by mouth daily. 90 tablet 1   aspirin EC 81 MG tablet Take 81 mg by mouth daily.     cetirizine (ZYRTEC) 10 MG tablet Take 10 mg by mouth daily.  (Patient not taking: Reported on 10/19/2022)     Clobetasol Prop Emollient Base 0.05 % emollient cream Apply topically 2 (two) times daily.     Coenzyme Q10 200 MG capsule Take 200 mg by mouth daily.     Evolocumab (REPATHA SURECLICK) 140 MG/ML SOAJ Inject 140 mg into the skin every 14 (fourteen) days. 6 mL 1   famotidine (PEPCID) 20 MG tablet Take 20 mg by mouth daily.     fluorouracil  (EFUDEX) 5 % cream Apply topically daily.     hydrochlorothiazide (HYDRODIURIL) 25 MG tablet TAKE 1 TABLET(25 MG) BY MOUTH DAILY 90 tablet 3   ipratropium (ATROVENT) 0.06 % nasal spray Place into the nose.     levocetirizine (XYZAL) 5 MG tablet Take 5 mg by mouth.     lisinopril (ZESTRIL) 20 MG tablet TAKE 1 TABLET(20 MG) BY MOUTH DAILY 90 tablet 3   rosuvastatin (CRESTOR) 5 MG tablet Take 5 mg by mouth daily.     sildenafil (REVATIO) 20 MG tablet SMARTSIG:2-5 Tablet(s) By Mouth PRN     No current facility-administered medications for this visit.    Family History  Problem Relation Age of Onset   Peripheral Artery Disease Mother    Stroke Father     Social History   Socioeconomic History   Marital status: Married    Spouse name: Not on file   Number of children: Not on file   Years of education: Not on file   Highest education level: Not on file  Occupational History   Not on file  Tobacco Use   Smoking status: Never   Smokeless tobacco: Never  Vaping Use   Vaping status: Never Used  Substance and Sexual Activity   Alcohol use: Yes    Alcohol/week: 5.0 standard drinks of alcohol    Types: 5 Standard drinks or equivalent per week   Drug use: No   Sexual activity: Not on file  Other Topics Concern   Not on file  Social History Narrative   Not on file   Social Determinants of Health   Financial Resource Strain: Not on file  Food Insecurity: Not on file  Transportation Needs: Not on file  Physical Activity: Not on file  Stress: Not on file  Social Connections: Not on file  Intimate Partner Violence: Not on file     REVIEW OF SYSTEMS:  *** [X]  denotes positive finding, [ ]  denotes negative finding Cardiac  Comments:  Chest pain or chest pressure:    Shortness of breath upon exertion:    Short of breath when lying flat:    Irregular heart rhythm:        Vascular    Pain in calf, thigh, or hip brought on by ambulation:    Pain in feet at night that wakes you  up from your sleep:     Blood clot in your veins:    Leg swelling:         Pulmonary    Oxygen at home:    Productive cough:     Wheezing:         Neurologic  Sudden weakness in arms or legs:     Sudden numbness in arms or legs:     Sudden onset of difficulty speaking or slurred speech:    Temporary loss of vision in one eye:     Problems with dizziness:         Gastrointestinal    Blood in stool:     Vomited blood:         Genitourinary    Burning when urinating:     Blood in urine:        Psychiatric    Major depression:         Hematologic    Bleeding problems:    Problems with blood clotting too easily:        Skin    Rashes or ulcers:        Constitutional    Fever or chills:      PHYSICAL EXAMINATION:  ***  General:  WDWN in NAD; vital signs documented above Gait: Not observed HENT: WNL, normocephalic Pulmonary: normal non-labored breathing Cardiac: {Desc; regular/irreg:14544} HR, {With/Without:20273} carotid bruit*** Abdomen: soft, NT; aortic pulse is *** palpable Skin: {With/Without:20273} rashes Vascular Exam/Pulses:  Right Left  Radial {Exam; arterial pulse strength 0-4:30167} {Exam; arterial pulse strength 0-4:30167}  Popliteal {Exam; arterial pulse strength 0-4:30167} {Exam; arterial pulse strength 0-4:30167}  DP {Exam; arterial pulse strength 0-4:30167} {Exam; arterial pulse strength 0-4:30167}  PT {Exam; arterial pulse strength 0-4:30167} {Exam; arterial pulse strength 0-4:30167}   Extremities: {With/Without:20273} open wounds Musculoskeletal: no muscle wasting or atrophy  Neurologic: A&O X 3; moving all extremities equally; speech is fluent/normal Psychiatric:  The pt has {Desc; normal/abnormal:11317::"Normal"} affect.   Non-Invasive Vascular Imaging:   Carotid Duplex on 01/16/2023 Right:  ***% ICA stenosis Left:  ***% ICA stenosis ***  Previous Carotid duplex on 01/12/2022: Right: 1-39% ICA stenosis Left:   1-39% ICA  stenosis    ASSESSMENT/PLAN:: 82 y.o. male here for follow up carotid artery stenosis and is s/p  right CEA for symptomatic carotid artery stenosis on 03/06/2017 by Dr. Darrick Penna and subsequently had a left CEA on 04/12/2017 for asymptomatic carotid artery stenosis.   -duplex today reveals *** -discussed s/s of stroke with pt and he understands should he develop any of these sx, he will go to the nearest ER or call 911. -pt will f/u in *** with carotid duplex -pt will call sooner should he have any issues. -continue statin/asa ***   Doreatha Massed, [facility] [phone]  Clinic MD:  Karin Lieu on call MD

## 2023-01-17 DIAGNOSIS — Z23 Encounter for immunization: Secondary | ICD-10-CM | POA: Diagnosis not present

## 2023-01-19 ENCOUNTER — Other Ambulatory Visit: Payer: Self-pay

## 2023-01-19 DIAGNOSIS — I6523 Occlusion and stenosis of bilateral carotid arteries: Secondary | ICD-10-CM

## 2023-01-26 DIAGNOSIS — H04123 Dry eye syndrome of bilateral lacrimal glands: Secondary | ICD-10-CM | POA: Diagnosis not present

## 2023-01-26 DIAGNOSIS — H43813 Vitreous degeneration, bilateral: Secondary | ICD-10-CM | POA: Diagnosis not present

## 2023-01-26 DIAGNOSIS — H35033 Hypertensive retinopathy, bilateral: Secondary | ICD-10-CM | POA: Diagnosis not present

## 2023-01-26 DIAGNOSIS — H26493 Other secondary cataract, bilateral: Secondary | ICD-10-CM | POA: Diagnosis not present

## 2023-01-26 DIAGNOSIS — Z961 Presence of intraocular lens: Secondary | ICD-10-CM | POA: Diagnosis not present

## 2023-02-06 DIAGNOSIS — R197 Diarrhea, unspecified: Secondary | ICD-10-CM | POA: Diagnosis not present

## 2023-03-02 ENCOUNTER — Telehealth: Payer: Self-pay | Admitting: Cardiovascular Disease

## 2023-03-02 NOTE — Telephone Encounter (Signed)
Called and spoke to patient. Verified name and DOB. Patient is calling to see why his Repatha was changed from monthly to 2 time monthly. Below message relayed to patient. Patient verbalized understanding and agree.  Cheree Ditto, [facility]     09/20/22  3:00 PM Note The Repatha infuser was discontinued by the manufacturer. Will send in Rx for the pens which are administered every 2 weeks (rather than once a month like the infuser). Directions on how to administer are included in the package but would be happy to explain to him over the phone as needed

## 2023-03-02 NOTE — Telephone Encounter (Signed)
Pt c/o medication issue:  1. Name of Medication: Evolocumab (REPATHA SURECLICK) 140 MG/ML SOAJ   2. How are you currently taking this medication (dosage and times per day)? Inject 140 mg into the skin every 14 (fourteen) days.   3. Are you having a reaction (difficulty breathing--STAT)? No 4. What is your medication issue? Patient is calling because he needs clarification on his prescription and how he should be taking this medication (once a month or every two weeks). Please advise.

## 2023-03-09 ENCOUNTER — Telehealth (INDEPENDENT_AMBULATORY_CARE_PROVIDER_SITE_OTHER): Payer: Self-pay | Admitting: Otolaryngology

## 2023-03-09 NOTE — Telephone Encounter (Signed)
Atrovent/Ipratropium 0.06% nasal spray was called in at [facility] on [address] Ginette Otto with 3 refills.

## 2023-03-16 DIAGNOSIS — Z0000 Encounter for general adult medical examination without abnormal findings: Secondary | ICD-10-CM | POA: Diagnosis not present

## 2023-03-16 DIAGNOSIS — J309 Allergic rhinitis, unspecified: Secondary | ICD-10-CM | POA: Diagnosis not present

## 2023-03-16 DIAGNOSIS — N1831 Chronic kidney disease, stage 3a: Secondary | ICD-10-CM | POA: Diagnosis not present

## 2023-03-16 DIAGNOSIS — E785 Hyperlipidemia, unspecified: Secondary | ICD-10-CM | POA: Diagnosis not present

## 2023-03-16 DIAGNOSIS — I10 Essential (primary) hypertension: Secondary | ICD-10-CM | POA: Diagnosis not present

## 2023-03-16 DIAGNOSIS — N529 Male erectile dysfunction, unspecified: Secondary | ICD-10-CM | POA: Diagnosis not present

## 2023-03-16 DIAGNOSIS — K219 Gastro-esophageal reflux disease without esophagitis: Secondary | ICD-10-CM | POA: Diagnosis not present

## 2023-03-16 DIAGNOSIS — Z1331 Encounter for screening for depression: Secondary | ICD-10-CM | POA: Diagnosis not present

## 2023-03-16 DIAGNOSIS — I6523 Occlusion and stenosis of bilateral carotid arteries: Secondary | ICD-10-CM | POA: Diagnosis not present

## 2023-03-16 DIAGNOSIS — Z23 Encounter for immunization: Secondary | ICD-10-CM | POA: Diagnosis not present

## 2023-03-16 DIAGNOSIS — I340 Nonrheumatic mitral (valve) insufficiency: Secondary | ICD-10-CM | POA: Diagnosis not present

## 2023-03-21 ENCOUNTER — Other Ambulatory Visit: Payer: Self-pay

## 2023-03-21 ENCOUNTER — Encounter (HOSPITAL_BASED_OUTPATIENT_CLINIC_OR_DEPARTMENT_OTHER): Payer: Self-pay | Admitting: *Deleted

## 2023-03-21 ENCOUNTER — Emergency Department (HOSPITAL_BASED_OUTPATIENT_CLINIC_OR_DEPARTMENT_OTHER): Payer: Medicare Other | Admitting: Radiology

## 2023-03-21 ENCOUNTER — Emergency Department (HOSPITAL_BASED_OUTPATIENT_CLINIC_OR_DEPARTMENT_OTHER)
Admission: EM | Admit: 2023-03-21 | Discharge: 2023-03-21 | Disposition: A | Discharge status: Home / Self Care | Payer: Medicare Other | Attending: Emergency Medicine | Admitting: Emergency Medicine

## 2023-03-21 DIAGNOSIS — Z7982 Long term (current) use of aspirin: Secondary | ICD-10-CM | POA: Insufficient documentation

## 2023-03-21 DIAGNOSIS — R748 Abnormal levels of other serum enzymes: Secondary | ICD-10-CM | POA: Diagnosis not present

## 2023-03-21 DIAGNOSIS — R079 Chest pain, unspecified: Secondary | ICD-10-CM | POA: Diagnosis not present

## 2023-03-21 DIAGNOSIS — I10 Essential (primary) hypertension: Secondary | ICD-10-CM | POA: Diagnosis not present

## 2023-03-21 DIAGNOSIS — Z79899 Other long term (current) drug therapy: Secondary | ICD-10-CM | POA: Diagnosis not present

## 2023-03-21 DIAGNOSIS — R0789 Other chest pain: Secondary | ICD-10-CM

## 2023-03-21 DIAGNOSIS — R1013 Epigastric pain: Secondary | ICD-10-CM | POA: Insufficient documentation

## 2023-03-21 LAB — BASIC METABOLIC PANEL
Anion gap: 12 (ref 5–15)
BUN: 22 mg/dL (ref 8–23)
CO2: 25 mmol/L (ref 22–32)
Calcium: 9.7 mg/dL (ref 8.9–10.3)
Chloride: 98 mmol/L (ref 98–111)
Creatinine, Ser: 1.24 mg/dL (ref 0.61–1.24)
GFR, Estimated: 59 mL/min — ABNORMAL LOW (ref >60)
Glucose, Bld: 112 mg/dL — ABNORMAL HIGH (ref 70–99)
Potassium: 3.6 mmol/L (ref 3.5–5.1)
Sodium: 135 mmol/L (ref 135–145)

## 2023-03-21 LAB — CBC
HCT: 44.6 % (ref 39.0–52.0)
Hemoglobin: 15.3 g/dL (ref 13.0–17.0)
MCH: 29.5 pg (ref 26.0–34.0)
MCHC: 34.3 g/dL (ref 30.0–36.0)
MCV: 86.1 fL (ref 80.0–100.0)
Platelets: 212 10*3/uL (ref 150–400)
RBC: 5.18 MIL/uL (ref 4.22–5.81)
RDW: 12.8 % (ref 11.5–15.5)
WBC: 12.1 10*3/uL — ABNORMAL HIGH (ref 4.0–10.5)
nRBC: 0 % (ref 0.0–0.2)

## 2023-03-21 LAB — HEPATIC FUNCTION PANEL
ALT: 54 U/L — ABNORMAL HIGH (ref 0–44)
AST: 73 U/L — ABNORMAL HIGH (ref 15–41)
Albumin: 4.6 g/dL (ref 3.5–5.0)
Alkaline Phosphatase: 80 U/L (ref 38–126)
Bilirubin, Direct: 0.2 mg/dL (ref 0.0–0.2)
Indirect Bilirubin: 0.5 mg/dL (ref 0.3–0.9)
Total Bilirubin: 0.7 mg/dL (ref 0.0–1.2)
Total Protein: 7.9 g/dL (ref 6.5–8.1)

## 2023-03-21 LAB — TROPONIN I (HIGH SENSITIVITY)
Troponin I (High Sensitivity): 11 ng/L (ref <18)
Troponin I (High Sensitivity): 12 ng/L (ref <18)

## 2023-03-21 LAB — LIPASE, BLOOD: Lipase: 33 U/L (ref 11–51)

## 2023-03-21 MED ORDER — PANTOPRAZOLE SODIUM 20 MG PO TBEC: 20.0000 mg | DELAYED_RELEASE_TABLET | Freq: Every day | ORAL | 0 refills | Status: DC

## 2023-03-21 MED ORDER — ALUM & MAG HYDROXIDE-SIMETH 200-200-20 MG/5ML PO SUSP
30.0000 mL | Freq: Once | ORAL | Status: AC
  Administered 2023-03-21: 30 mL via ORAL
  Filled 2023-03-21: qty 30

## 2023-03-21 MED ORDER — LIDOCAINE VISCOUS HCL 2 % MT SOLN
15.0000 mL | Freq: Once | OROMUCOSAL | Status: AC
Start: 2023-03-21 — End: 2023-03-21
  Administered 2023-03-21: 15 mL via ORAL
  Filled 2023-03-21: qty 15

## 2023-03-21 MED ORDER — SUCRALFATE 1 G PO TABS: 1.0000 g | ORAL_TABLET | Freq: Three times a day (TID) | ORAL | 0 refills | Status: DC

## 2023-03-21 NOTE — ED Provider Notes (Signed)
 [place] [facility]GENC[facility] DEPARTMENT AT [facility] Provider Note  CSN: [id number] Arrival date & time: 03/21/23 0010  Chief Complaint(s) Chest Pain  HPI Patrick Vargas is a 83 y.o. male with past medical history as below, significant for HLD HTN PVC's PVD nephrolithiasis who presents to the ED with complaint of intermittent cp epigastric pain  Ongoing over the past couple days, somewhat worsened after eating or drinking.  Worse when lying down flat.  Burning, sharp sensation to epigastrium, mid sternum that is intermittent.  No associated dyspnea, diaphoresis, nausea or vomiting.  No BRBPR or melena.  No evidence of bleeding.  History of GERD, has been taking Tums which has somewhat improved his symptoms.  Reports colonoscopy in the last few years which was normal, he also had esophageal stricture which was stretched previously.  No daily alcohol use.  Past Medical History 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   Patient Active Problem List   Diagnosis Date Noted   Primary osteoarthritis of left knee 03/19/2018   Carotid artery stenosis 04/12/2017   Carotid artery stenosis, symptomatic, right 03/06/2017   Preoperative cardiovascular examination 11/19/2015   Hypertension 09/03/2012   Hyperlipidemia 09/03/2012   Overweight (BMI 25.0-29.9) 09/03/2012   Statin intolerance 09/03/2012   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  Authorizing Provider  pantoprazole  (PROTONIX ) 20 MG tablet Take 1 tablet (20 mg total) by mouth daily for 14 days. 03/21/23 04/04/23 Yes Elnor Savant A, DO  sucralfate  (CARAFATE ) 1 g tablet Take 1 tablet (1 g total) by mouth with breakfast, with lunch, and with evening meal for 7 days. 03/21/23 03/28/23 Yes Elnor Savant A, DO  acetaminophen  (TYLENOL ) 500 MG tablet Take 1,000 mg by mouth every 6 (six) hours as needed for moderate pain or headache.    [provider]  amLODipine  (NORVASC ) 10 MG tablet Take 1 tablet (10 mg total) by mouth daily. 06/25/19   Croitoru, Mihai, MD  aspirin  EC 81 MG tablet Take 81 mg by mouth daily.    [provider]  cetirizine (ZYRTEC) 10 MG tablet Take 10 mg by mouth daily.  Patient not taking: Reported on 10/19/2022    [provider]  Clobetasol Prop Emollient Base 0.05 % emollient cream Apply topically 2 (two) times daily.    [provider]  Coenzyme Q10 200 MG capsule Take 200 mg by mouth daily.    [provider]  Evolocumab  (REPATHA  SURECLICK) 140 MG/ML SOAJ Inject 140 mg into the skin every 14 (fourteen) days. 09/20/22   Croitoru, Mihai, MD  famotidine (PEPCID) 20 MG tablet Take 20 mg by mouth daily. 06/22/22   [provider]  fluorouracil (EFUDEX) 5 % cream Apply topically daily. 11/11/21   [provider]  hydrochlorothiazide  (HYDRODIURIL ) 25 MG tablet TAKE 1 TABLET(25 MG) BY MOUTH DAILY 11/16/22   Croitoru, Mihai, MD  ipratropium (ATROVENT) 0.06 %  nasal spray Place into the nose. 09/15/22   [provider]  levocetirizine (XYZAL) 5 MG tablet Take 5 mg by mouth.    [provider]  lisinopril  (ZESTRIL ) 20 MG tablet TAKE 1 TABLET(20 MG) BY MOUTH DAILY 10/19/22   Loistine Sober, NP  rosuvastatin (CRESTOR) 5 MG tablet Take 5 mg by mouth daily.    [provider]  sildenafil (REVATIO) 20 MG tablet SMARTSIG:2-5 Tablet(s) By Mouth PRN 06/20/19   [provider]                                                                                                                                     Past Surgical History Past Surgical History:  Procedure Laterality Date   BUNIONECTOMY  1994   Left foot   CAROTID ENDARTERECTOMY     CARPAL TUNNEL RELEASE  1999   Left hand   ENDARTERECTOMY Right 03/06/2017   Procedure: ENDARTERECTOMY CAROTID RIGHT;  Surgeon: Harvey Carlin BRAVO, MD;  Location: [facility] OR;  Service: Vascular;  Laterality: Right;   ENDARTERECTOMY Left 04/12/2017   Procedure: ENDARTERECTOMY CAROTID LEFT;  Surgeon: Harvey Carlin BRAVO, MD;  Location: [facility] OR;  Service: Vascular;  Laterality: Left;   PATCH ANGIOPLASTY Left 04/12/2017   Procedure: PATCH ANGIOPLASTY LEFT CAROTID ARTERY;  Surgeon: Harvey Carlin BRAVO, MD;  Location: [facility] OR;  Service: Vascular;  Laterality: Left;   TENDON REPAIR Right    right foot   TONSILLECTOMY     TOTAL KNEE ARTHROPLASTY  12/04/2010   Right   TOTAL KNEE ARTHROPLASTY Left 03/19/2018   Procedure: TOTAL KNEE ARTHROPLASTY;  Surgeon: Yvone Rush, MD;  Location: [facility] ORS;  Service: Orthopedics;  Laterality: Left;   Family History Family History  Problem Relation Age of Onset   Peripheral Artery Disease Mother    Stroke Father     Social History Social History   Tobacco Use   Smoking status: Never   Smokeless tobacco: Never  Vaping Use   Vaping status: Never Used  Substance Use Topics   Alcohol use: Yes    Alcohol/week: 5.0 standard drinks of alcohol    Types: 5 Standard drinks or equivalent per week   Drug use: No   Allergies Amoxicillin, Oxycodone, and Statins  Review of Systems Review of Systems  Constitutional:  Negative for chills and fever.  Respiratory:  Positive for cough.   Cardiovascular:  Positive for chest pain.  Gastrointestinal:  Positive for abdominal pain. Negative for blood in stool, diarrhea, nausea and vomiting.  Genitourinary:  Negative for difficulty urinating.  Musculoskeletal:  Negative for arthralgias.   Neurological:  Negative for light-headedness.  All other systems reviewed and are negative.   Physical Exam Vital Signs  I have reviewed the triage vital signs BP (!) 153/82   Pulse 83   Temp 98 F (36.7 C) (Oral)   Resp (!) 22   Wt 81.6 kg   SpO2 99%  BMI 26.58 kg/m  Physical Exam Vitals and nursing note reviewed.  Constitutional:      General: He is not in acute distress.    Appearance: He is well-developed.  HENT:     Head: Normocephalic and atraumatic.     Right Ear: External ear normal.     Left Ear: External ear normal.     Mouth/Throat:     Mouth: Mucous membranes are moist.  Eyes:     General: No scleral icterus. Cardiovascular:     Rate and Rhythm: Normal rate and regular rhythm.     Pulses: Normal pulses.     Heart sounds: Normal heart sounds.  Pulmonary:     Effort: Pulmonary effort is normal. No respiratory distress.     Breath sounds: Normal breath sounds.  Abdominal:     General: Abdomen is flat.     Palpations: Abdomen is soft.     Tenderness: There is abdominal tenderness in the epigastric area.       Comments: Mild TTP in epigastrium  Musculoskeletal:     Cervical back: No rigidity.     Right lower leg: No edema.     Left lower leg: No edema.  Skin:    General: Skin is warm and dry.     Capillary Refill: Capillary refill takes less than 2 seconds.  Neurological:     Mental Status: He is alert.  Psychiatric:        Mood and Affect: Mood normal.        Behavior: Behavior normal.     ED Results and Treatments Labs (all labs ordered are listed, but only abnormal results are displayed) Labs Reviewed  BASIC METABOLIC PANEL - Abnormal; Notable for the following components:      Result Value   Glucose, Bld 112 (*)    GFR, Estimated 59 (*)    All other components within normal limits  CBC - Abnormal; Notable for the following components:   WBC 12.1 (*)    All other components within normal limits  HEPATIC FUNCTION PANEL - Abnormal;  Notable for the following components:   AST 73 (*)    ALT 54 (*)    All other components within normal limits  LIPASE, BLOOD  TROPONIN I (HIGH SENSITIVITY)  TROPONIN I (HIGH SENSITIVITY)                                                                                                                          Radiology DG Chest 2 View Result Date: 03/21/2023 CLINICAL DATA:  Chest pain beginning yesterday.  Worse today. EXAM: CHEST - 2 VIEW COMPARISON:  11/26/2021 FINDINGS: Heart size and pulmonary vascularity are normal. Lungs are clear. No pleural effusion or pneumothorax. Mediastinal contours appear intact. Degenerative changes in the spine and shoulders. Old resection or resorption of the distal clavicles. IMPRESSION: No active cardiopulmonary disease. Electronically Signed   By: Elsie Gravely M.D.   On: 03/21/2023 00:48    Pertinent  labs & imaging results that were available during my care of the patient were reviewed by me and considered in my medical decision making (see MDM for details).  Medications Ordered in ED Medications  alum & mag hydroxide-simeth (MAALOX/MYLANTA) 200-200-20 MG/5ML suspension 30 mL (30 mLs Oral Given 03/21/23 0356)    And  lidocaine  (XYLOCAINE ) 2 % viscous mouth solution 15 mL (15 mLs Oral Given 03/21/23 0356)                                                                                                                                     Procedures Procedures  (including critical care time)  Medical Decision Making / ED Course    Medical Decision Making:    SHAQUILE LUTZE is a 83 y.o. male with past medical history as below, significant for HLD HTN PVC's PVD nephrolithiasis who presents to the ED with complaint of intermittent cp. The complaint involves an extensive differential diagnosis and also carries with it a high risk of complications and morbidity.  Serious etiology was considered. Ddx includes but is not limited to: Differential diagnosis includes  but is not exclusive to acute cholecystitis, intrathoracic causes for epigastric abdominal pain, gastritis, duodenitis, pancreatitis, small bowel or large bowel obstruction, abdominal aortic aneurysm, hernia, gastritis, etc.   Complete initial physical exam performed, notably the patient was in no distress, no hypoxia, currently asymptomatic.    Reviewed and confirmed nursing documentation for past medical history, family history, social history.  Vital signs reviewed.     Clinical Course as of 03/21/23 0439  Tue Mar 21, 2023  0414 LFT's mildly elevated, tbili is wnl, he has no RUQ pain.  [SG]  0435 Feeling much better [SG]    Clinical Course User Index [SG] Elnor Jayson LABOR, DO    Brief summary: 83 year old male with history as above here with epigastric pain over the last 2 days.  Intermittent.  P.o. intake.  Worse when lying flat.  Occasional cough when he lies down associate with the discomfort.  He has had this in the past but seems worse today.  Screening labs ordered in triage reviewed and unremarkable.  EKG without acute ischemic changes.  Chest x-ray unremarkable.  Pain seems likely more GI in nature, atypical chest pain.  Will give GI cocktail, p.o. challenge and reassess.  Feeling much better on recheck, tolerating PO  Labs are stable.  He is feeling much better after Maalox.  Chest pain likely atypical in nature. His HEART score is 4 given age/risk factors. Troponin is negative, EKG non-ischemic, CXR wnl. The patient's chest pain is not suggestive of pulmonary embolus, cardiac ischemia, aortic dissection, pericarditis, myocarditis, pulmonary embolism, pneumothorax, pneumonia, Zoster, or esophageal perforation, or other serious etiology.  Historically not abrupt in onset, tearing or ripping, pulses symmetric. EKG nonspecific for ischemia/infarction. No dysrhythmias, brugada, WPW, prolonged QT noted.   The patient improved significantly and was discharged in  stable condition. Detailed  discussions were had with the patient/guardian regarding current findings, and need for close f/u with PCP or on call doctor. The patient/guardian has been instructed to return immediately if the symptoms worsen in any way for re-evaluation. Patient/guardian verbalized understanding and is in agreement with current care plan. All questions answered prior to discharge.               Additional history obtained: -Additional history obtained from family -External records from outside source obtained and reviewed including: Chart review including previous notes, labs, imaging, consultation notes including  Documentation, home medications, prior labs, prior imaging   Lab Tests: -I ordered, reviewed, and interpreted labs.   The pertinent results include:   Labs Reviewed  BASIC METABOLIC PANEL - Abnormal; Notable for the following components:      Result Value   Glucose, Bld 112 (*)    GFR, Estimated 59 (*)    All other components within normal limits  CBC - Abnormal; Notable for the following components:   WBC 12.1 (*)    All other components within normal limits  HEPATIC FUNCTION PANEL - Abnormal; Notable for the following components:   AST 73 (*)    ALT 54 (*)    All other components within normal limits  LIPASE, BLOOD  TROPONIN I (HIGH SENSITIVITY)  TROPONIN I (HIGH SENSITIVITY)    Notable for mild elev LFT's  EKG   EKG Interpretation Date/Time:  Tuesday March 21 2023 00:21:18 EST Ventricular Rate:  98 PR Interval:  176 QRS Duration:  70 QT Interval:  334 QTC Calculation: 426 R Axis:   -60  Text Interpretation: Normal sinus rhythm Indeterminate axis Anteroseptal infarct , age undetermined Abnormal ECG When compared with ECG of 19-Oct-2022 15:06, Vent. rate has increased BY  32 BPM Nonspecific T wave abnormality, worse in Lateral leads Interpretation limited secondary to artifact no stemi similar  to prior Normal sinus rhythm Confirmed by Elnor Savant ([id number]) on  03/21/2023 1:39:05 AM         Imaging Studies ordered: I ordered imaging studies including CXR I independently visualized the following imaging with scope of interpretation limited to determining acute life threatening conditions related to emergency care; findings noted above I independently visualized and interpreted imaging. I agree with the radiologist interpretation   Medicines ordered and prescription drug management: Meds ordered this encounter  Medications   AND Linked Order Group    alum & mag hydroxide-simeth (MAALOX/MYLANTA) 200-200-20 MG/5ML suspension 30 mL    lidocaine  (XYLOCAINE ) 2 % viscous mouth solution 15 mL   sucralfate  (CARAFATE ) 1 g tablet    Sig: Take 1 tablet (1 g total) by mouth with breakfast, with lunch, and with evening meal for 7 days.    Dispense:  21 tablet    Refill:  0   pantoprazole  (PROTONIX ) 20 MG tablet    Sig: Take 1 tablet (20 mg total) by mouth daily for 14 days.    Dispense:  14 tablet    Refill:  0    -I have reviewed the patients home medicines and have made adjustments as needed   Consultations Obtained: na   Cardiac Monitoring: The patient was maintained on a cardiac monitor.  I personally viewed and interpreted the cardiac monitored which showed an underlying rhythm of: NSR Continuous pulse oximetry interpreted by myself, 100% on RA.    Social Determinants of Health:  Diagnosis or treatment significantly limited by social determinants of health: na   Reevaluation: After  the interventions noted above, I reevaluated the patient and found that they have improved  Co morbidities that complicate the patient evaluation  Past Medical History:  Diagnosis Date   Arthritis    Carotid artery occlusion    Dyslipidemia    GERD (gastroesophageal reflux disease)    Headache    ocular migraines   History of kidney stones    Peripheral vascular disease (HCC)    Carotid stenosis right    PVC's (premature ventricular contractions)     Systemic hypertension    Transient global amnesia    30 years ago      Dispostion: Disposition decision including need for hospitalization was considered, and patient discharged from emergency department.    Final Clinical Impression(s) / ED Diagnoses Final diagnoses:  Atypical chest pain  Epigastric pain  Elevated liver enzymes        Elnor Jayson LABOR, DO 03/21/23 [phone]

## 2023-03-21 NOTE — Discharge Instructions (Addendum)
 It was a pleasure caring for you today in the emergency department.  Eat a bland diet over the next 2 weeks.  Take medications as prescribed.  If you develop any worsening worrisome symptoms such as severe pain, vomiting, blood in your stool, blood in your vomit, skin yellowing, fever, please return to the ER for recheck.  Otherwise please follow-up with gastroenterology in 2 weeks if you are still having symptoms.  Your liver enzymes are marginally elevated, this should improve, recommend you see you PCP in the next 2 weeks for a recheck to ensure they normalize.   Please return to the emergency department for any worsening or worrisome symptoms.

## 2023-03-21 NOTE — ED Triage Notes (Signed)
Pt is here for evaluation of chest pain which began yesterday and eased up but came back worse today.  No sob or diaphoresis with this.

## 2023-03-24 ENCOUNTER — Other Ambulatory Visit (HOSPITAL_COMMUNITY): Payer: Self-pay | Admitting: Internal Medicine

## 2023-03-24 ENCOUNTER — Ambulatory Visit (HOSPITAL_COMMUNITY)
Admission: RE | Admit: 2023-03-24 | Discharge: 2023-03-24 | Disposition: A | Discharge status: Home / Self Care | Payer: Medicare Other | Source: Ambulatory Visit | Attending: Internal Medicine

## 2023-03-24 DIAGNOSIS — R1011 Right upper quadrant pain: Secondary | ICD-10-CM

## 2023-03-24 DIAGNOSIS — R109 Unspecified abdominal pain: Secondary | ICD-10-CM | POA: Diagnosis not present

## 2023-03-24 DIAGNOSIS — R7401 Elevation of levels of liver transaminase levels: Secondary | ICD-10-CM | POA: Diagnosis not present

## 2023-03-24 DIAGNOSIS — D72829 Elevated white blood cell count, unspecified: Secondary | ICD-10-CM | POA: Diagnosis not present

## 2023-04-04 DIAGNOSIS — K8020 Calculus of gallbladder without cholecystitis without obstruction: Secondary | ICD-10-CM | POA: Diagnosis not present

## 2023-04-19 DIAGNOSIS — K8010 Calculus of gallbladder with chronic cholecystitis without obstruction: Secondary | ICD-10-CM | POA: Diagnosis not present

## 2023-04-19 DIAGNOSIS — K660 Peritoneal adhesions (postprocedural) (postinfection): Secondary | ICD-10-CM | POA: Diagnosis not present

## 2023-04-19 DIAGNOSIS — K812 Acute cholecystitis with chronic cholecystitis: Secondary | ICD-10-CM | POA: Diagnosis not present

## 2023-04-19 DIAGNOSIS — K8012 Calculus of gallbladder with acute and chronic cholecystitis without obstruction: Secondary | ICD-10-CM | POA: Diagnosis not present

## 2023-05-02 DIAGNOSIS — K8020 Calculus of gallbladder without cholecystitis without obstruction: Secondary | ICD-10-CM | POA: Diagnosis not present

## 2023-05-04 DIAGNOSIS — I340 Nonrheumatic mitral (valve) insufficiency: Secondary | ICD-10-CM | POA: Diagnosis not present

## 2023-05-04 DIAGNOSIS — I10 Essential (primary) hypertension: Secondary | ICD-10-CM | POA: Diagnosis not present

## 2023-05-04 DIAGNOSIS — N1831 Chronic kidney disease, stage 3a: Secondary | ICD-10-CM | POA: Diagnosis not present

## 2023-05-08 DIAGNOSIS — R531 Weakness: Secondary | ICD-10-CM | POA: Diagnosis not present

## 2023-05-08 DIAGNOSIS — Z9889 Other specified postprocedural states: Secondary | ICD-10-CM | POA: Diagnosis not present

## 2023-05-08 DIAGNOSIS — R109 Unspecified abdominal pain: Secondary | ICD-10-CM | POA: Diagnosis not present

## 2023-05-08 DIAGNOSIS — R509 Fever, unspecified: Secondary | ICD-10-CM | POA: Diagnosis not present

## 2023-05-08 DIAGNOSIS — K5730 Diverticulosis of large intestine without perforation or abscess without bleeding: Secondary | ICD-10-CM | POA: Diagnosis not present

## 2023-05-15 DIAGNOSIS — M1990 Unspecified osteoarthritis, unspecified site: Secondary | ICD-10-CM | POA: Diagnosis not present

## 2023-05-15 DIAGNOSIS — I10 Essential (primary) hypertension: Secondary | ICD-10-CM | POA: Diagnosis not present

## 2023-05-15 DIAGNOSIS — I340 Nonrheumatic mitral (valve) insufficiency: Secondary | ICD-10-CM | POA: Diagnosis not present

## 2023-05-15 DIAGNOSIS — E785 Hyperlipidemia, unspecified: Secondary | ICD-10-CM | POA: Diagnosis not present

## 2023-05-15 DIAGNOSIS — I129 Hypertensive chronic kidney disease with stage 1 through stage 4 chronic kidney disease, or unspecified chronic kidney disease: Secondary | ICD-10-CM | POA: Diagnosis not present

## 2023-05-15 DIAGNOSIS — N1831 Chronic kidney disease, stage 3a: Secondary | ICD-10-CM | POA: Diagnosis not present

## 2023-05-22 ENCOUNTER — Other Ambulatory Visit: Payer: Self-pay | Admitting: Cardiovascular Disease

## 2023-05-22 DIAGNOSIS — E7801 Familial hypercholesterolemia: Secondary | ICD-10-CM

## 2023-05-22 DIAGNOSIS — I6523 Occlusion and stenosis of bilateral carotid arteries: Secondary | ICD-10-CM

## 2023-05-22 DIAGNOSIS — Z9889 Other specified postprocedural states: Secondary | ICD-10-CM

## 2023-06-02 DIAGNOSIS — I340 Nonrheumatic mitral (valve) insufficiency: Secondary | ICD-10-CM | POA: Diagnosis not present

## 2023-06-02 DIAGNOSIS — I10 Essential (primary) hypertension: Secondary | ICD-10-CM | POA: Diagnosis not present

## 2023-06-02 DIAGNOSIS — N1831 Chronic kidney disease, stage 3a: Secondary | ICD-10-CM | POA: Diagnosis not present

## 2023-06-08 DIAGNOSIS — N1831 Chronic kidney disease, stage 3a: Secondary | ICD-10-CM | POA: Diagnosis not present

## 2023-06-08 DIAGNOSIS — Z9049 Acquired absence of other specified parts of digestive tract: Secondary | ICD-10-CM | POA: Diagnosis not present

## 2023-06-08 DIAGNOSIS — I10 Essential (primary) hypertension: Secondary | ICD-10-CM | POA: Diagnosis not present

## 2023-06-08 DIAGNOSIS — M79641 Pain in right hand: Secondary | ICD-10-CM | POA: Diagnosis not present

## 2023-06-14 DIAGNOSIS — I10 Essential (primary) hypertension: Secondary | ICD-10-CM | POA: Diagnosis not present

## 2023-06-14 DIAGNOSIS — N1831 Chronic kidney disease, stage 3a: Secondary | ICD-10-CM | POA: Diagnosis not present

## 2023-06-14 DIAGNOSIS — E785 Hyperlipidemia, unspecified: Secondary | ICD-10-CM | POA: Diagnosis not present

## 2023-06-14 DIAGNOSIS — I340 Nonrheumatic mitral (valve) insufficiency: Secondary | ICD-10-CM | POA: Diagnosis not present

## 2023-07-02 DIAGNOSIS — I10 Essential (primary) hypertension: Secondary | ICD-10-CM | POA: Diagnosis not present

## 2023-07-02 DIAGNOSIS — I340 Nonrheumatic mitral (valve) insufficiency: Secondary | ICD-10-CM | POA: Diagnosis not present

## 2023-07-02 DIAGNOSIS — N1831 Chronic kidney disease, stage 3a: Secondary | ICD-10-CM | POA: Diagnosis not present

## 2023-07-04 DIAGNOSIS — K8020 Calculus of gallbladder without cholecystitis without obstruction: Secondary | ICD-10-CM | POA: Diagnosis not present

## 2023-07-04 DIAGNOSIS — K4091 Unilateral inguinal hernia, without obstruction or gangrene, recurrent: Secondary | ICD-10-CM | POA: Diagnosis not present

## 2023-07-15 DIAGNOSIS — I10 Essential (primary) hypertension: Secondary | ICD-10-CM | POA: Diagnosis not present

## 2023-07-15 DIAGNOSIS — I340 Nonrheumatic mitral (valve) insufficiency: Secondary | ICD-10-CM | POA: Diagnosis not present

## 2023-07-15 DIAGNOSIS — N1831 Chronic kidney disease, stage 3a: Secondary | ICD-10-CM | POA: Diagnosis not present

## 2023-07-15 DIAGNOSIS — E785 Hyperlipidemia, unspecified: Secondary | ICD-10-CM | POA: Diagnosis not present

## 2023-08-01 DIAGNOSIS — I10 Essential (primary) hypertension: Secondary | ICD-10-CM | POA: Diagnosis not present

## 2023-08-01 DIAGNOSIS — I340 Nonrheumatic mitral (valve) insufficiency: Secondary | ICD-10-CM | POA: Diagnosis not present

## 2023-08-01 DIAGNOSIS — N1831 Chronic kidney disease, stage 3a: Secondary | ICD-10-CM | POA: Diagnosis not present

## 2023-08-14 DIAGNOSIS — I10 Essential (primary) hypertension: Secondary | ICD-10-CM | POA: Diagnosis not present

## 2023-08-14 DIAGNOSIS — I340 Nonrheumatic mitral (valve) insufficiency: Secondary | ICD-10-CM | POA: Diagnosis not present

## 2023-08-14 DIAGNOSIS — E785 Hyperlipidemia, unspecified: Secondary | ICD-10-CM | POA: Diagnosis not present

## 2023-08-14 DIAGNOSIS — N1831 Chronic kidney disease, stage 3a: Secondary | ICD-10-CM | POA: Diagnosis not present

## 2023-08-31 DIAGNOSIS — I10 Essential (primary) hypertension: Secondary | ICD-10-CM | POA: Diagnosis not present

## 2023-08-31 DIAGNOSIS — N1831 Chronic kidney disease, stage 3a: Secondary | ICD-10-CM | POA: Diagnosis not present

## 2023-08-31 DIAGNOSIS — I340 Nonrheumatic mitral (valve) insufficiency: Secondary | ICD-10-CM | POA: Diagnosis not present

## 2023-09-14 DIAGNOSIS — I340 Nonrheumatic mitral (valve) insufficiency: Secondary | ICD-10-CM | POA: Diagnosis not present

## 2023-09-14 DIAGNOSIS — N1831 Chronic kidney disease, stage 3a: Secondary | ICD-10-CM | POA: Diagnosis not present

## 2023-09-14 DIAGNOSIS — E785 Hyperlipidemia, unspecified: Secondary | ICD-10-CM | POA: Diagnosis not present

## 2023-09-14 DIAGNOSIS — I10 Essential (primary) hypertension: Secondary | ICD-10-CM | POA: Diagnosis not present

## 2023-09-19 DIAGNOSIS — K4091 Unilateral inguinal hernia, without obstruction or gangrene, recurrent: Secondary | ICD-10-CM | POA: Diagnosis not present

## 2023-09-21 DIAGNOSIS — D0461 Carcinoma in situ of skin of right upper limb, including shoulder: Secondary | ICD-10-CM | POA: Diagnosis not present

## 2023-09-21 DIAGNOSIS — L280 Lichen simplex chronicus: Secondary | ICD-10-CM | POA: Diagnosis not present

## 2023-09-21 DIAGNOSIS — D044 Carcinoma in situ of skin of scalp and neck: Secondary | ICD-10-CM | POA: Diagnosis not present

## 2023-09-21 DIAGNOSIS — D0361 Melanoma in situ of right upper limb, including shoulder: Secondary | ICD-10-CM | POA: Diagnosis not present

## 2023-09-21 DIAGNOSIS — L570 Actinic keratosis: Secondary | ICD-10-CM | POA: Diagnosis not present

## 2023-09-21 DIAGNOSIS — D485 Neoplasm of uncertain behavior of skin: Secondary | ICD-10-CM | POA: Diagnosis not present

## 2023-09-29 DIAGNOSIS — D176 Benign lipomatous neoplasm of spermatic cord: Secondary | ICD-10-CM | POA: Diagnosis not present

## 2023-09-29 DIAGNOSIS — K4091 Unilateral inguinal hernia, without obstruction or gangrene, recurrent: Secondary | ICD-10-CM | POA: Diagnosis not present

## 2023-09-30 DIAGNOSIS — N1831 Chronic kidney disease, stage 3a: Secondary | ICD-10-CM | POA: Diagnosis not present

## 2023-09-30 DIAGNOSIS — I10 Essential (primary) hypertension: Secondary | ICD-10-CM | POA: Diagnosis not present

## 2023-09-30 DIAGNOSIS — I340 Nonrheumatic mitral (valve) insufficiency: Secondary | ICD-10-CM | POA: Diagnosis not present

## 2023-10-15 DIAGNOSIS — N1831 Chronic kidney disease, stage 3a: Secondary | ICD-10-CM | POA: Diagnosis not present

## 2023-10-15 DIAGNOSIS — E785 Hyperlipidemia, unspecified: Secondary | ICD-10-CM | POA: Diagnosis not present

## 2023-10-15 DIAGNOSIS — I340 Nonrheumatic mitral (valve) insufficiency: Secondary | ICD-10-CM | POA: Diagnosis not present

## 2023-10-15 DIAGNOSIS — I10 Essential (primary) hypertension: Secondary | ICD-10-CM | POA: Diagnosis not present

## 2023-10-17 DIAGNOSIS — K4091 Unilateral inguinal hernia, without obstruction or gangrene, recurrent: Secondary | ICD-10-CM | POA: Diagnosis not present

## 2023-10-25 DIAGNOSIS — D0361 Melanoma in situ of right upper limb, including shoulder: Secondary | ICD-10-CM | POA: Diagnosis not present

## 2023-10-30 DIAGNOSIS — N1831 Chronic kidney disease, stage 3a: Secondary | ICD-10-CM | POA: Diagnosis not present

## 2023-10-30 DIAGNOSIS — I10 Essential (primary) hypertension: Secondary | ICD-10-CM | POA: Diagnosis not present

## 2023-10-30 DIAGNOSIS — I340 Nonrheumatic mitral (valve) insufficiency: Secondary | ICD-10-CM | POA: Diagnosis not present

## 2023-11-08 DIAGNOSIS — Z483 Aftercare following surgery for neoplasm: Secondary | ICD-10-CM | POA: Diagnosis not present

## 2023-11-14 DIAGNOSIS — N1831 Chronic kidney disease, stage 3a: Secondary | ICD-10-CM | POA: Diagnosis not present

## 2023-11-14 DIAGNOSIS — I10 Essential (primary) hypertension: Secondary | ICD-10-CM | POA: Diagnosis not present

## 2023-11-14 DIAGNOSIS — I340 Nonrheumatic mitral (valve) insufficiency: Secondary | ICD-10-CM | POA: Diagnosis not present

## 2023-11-14 DIAGNOSIS — E785 Hyperlipidemia, unspecified: Secondary | ICD-10-CM | POA: Diagnosis not present

## 2023-11-24 ENCOUNTER — Other Ambulatory Visit: Payer: Self-pay | Admitting: Pharmacist

## 2023-11-24 DIAGNOSIS — E78011 Heterozygous familial hypercholesterolemia (hefh): Secondary | ICD-10-CM

## 2023-11-24 DIAGNOSIS — Z9889 Other specified postprocedural states: Secondary | ICD-10-CM

## 2023-11-24 DIAGNOSIS — I6523 Occlusion and stenosis of bilateral carotid arteries: Secondary | ICD-10-CM

## 2023-11-24 MED ORDER — REPATHA SURECLICK 140 MG/ML ~~LOC~~ SOAJ: 140.0000 mg | SUBCUTANEOUS | 0 refills | Status: DC

## 2023-11-29 DIAGNOSIS — I340 Nonrheumatic mitral (valve) insufficiency: Secondary | ICD-10-CM | POA: Diagnosis not present

## 2023-11-29 DIAGNOSIS — I10 Essential (primary) hypertension: Secondary | ICD-10-CM | POA: Diagnosis not present

## 2023-11-29 DIAGNOSIS — N1831 Chronic kidney disease, stage 3a: Secondary | ICD-10-CM | POA: Diagnosis not present

## 2023-12-07 ENCOUNTER — Other Ambulatory Visit: Payer: Self-pay | Admitting: Cardiovascular Disease

## 2023-12-07 DIAGNOSIS — I6523 Occlusion and stenosis of bilateral carotid arteries: Secondary | ICD-10-CM

## 2023-12-07 DIAGNOSIS — Z9889 Other specified postprocedural states: Secondary | ICD-10-CM

## 2023-12-07 DIAGNOSIS — E78011 Heterozygous familial hypercholesterolemia (hefh): Secondary | ICD-10-CM

## 2023-12-13 DIAGNOSIS — Z23 Encounter for immunization: Secondary | ICD-10-CM | POA: Diagnosis not present

## 2023-12-15 DIAGNOSIS — N1831 Chronic kidney disease, stage 3a: Secondary | ICD-10-CM | POA: Diagnosis not present

## 2023-12-15 DIAGNOSIS — E785 Hyperlipidemia, unspecified: Secondary | ICD-10-CM | POA: Diagnosis not present

## 2023-12-15 DIAGNOSIS — I340 Nonrheumatic mitral (valve) insufficiency: Secondary | ICD-10-CM | POA: Diagnosis not present

## 2023-12-15 DIAGNOSIS — I10 Essential (primary) hypertension: Secondary | ICD-10-CM | POA: Diagnosis not present

## 2023-12-18 DIAGNOSIS — N1831 Chronic kidney disease, stage 3a: Secondary | ICD-10-CM | POA: Diagnosis not present

## 2023-12-18 DIAGNOSIS — I10 Essential (primary) hypertension: Secondary | ICD-10-CM | POA: Diagnosis not present

## 2023-12-19 DIAGNOSIS — N5201 Erectile dysfunction due to arterial insufficiency: Secondary | ICD-10-CM | POA: Diagnosis not present

## 2023-12-19 DIAGNOSIS — R351 Nocturia: Secondary | ICD-10-CM | POA: Diagnosis not present

## 2023-12-19 DIAGNOSIS — N401 Enlarged prostate with lower urinary tract symptoms: Secondary | ICD-10-CM | POA: Diagnosis not present

## 2023-12-20 ENCOUNTER — Ambulatory Visit: Attending: Cardiovascular Disease | Admitting: Cardiovascular Disease

## 2023-12-20 VITALS — BP 118/60 | HR 79 | Ht 69.0 in | Wt 178.0 lb

## 2023-12-20 DIAGNOSIS — I6523 Occlusion and stenosis of bilateral carotid arteries: Secondary | ICD-10-CM | POA: Diagnosis not present

## 2023-12-20 DIAGNOSIS — I493 Ventricular premature depolarization: Secondary | ICD-10-CM | POA: Insufficient documentation

## 2023-12-20 DIAGNOSIS — E78011 Heterozygous familial hypercholesterolemia (hefh): Secondary | ICD-10-CM | POA: Insufficient documentation

## 2023-12-20 DIAGNOSIS — I10 Essential (primary) hypertension: Secondary | ICD-10-CM | POA: Diagnosis not present

## 2023-12-20 NOTE — Progress Notes (Signed)
 Cardiology Office Note    Date:  12/20/2023   ID:  Patrick Vargas, DOB Jul 13, 1940, MRN [id number]  PCP:  Dyane Anthony RAMAN, FNP  Cardiologist:   Jerel Balding, MD   No chief complaint on file.   History of Present Illness:  Patrick Vargas is a 83 y.o. male with hypertension and hyperlipidemia and bilateral carotid endarterectomy in January 2019 (Dr. Harvey) .  Last week he felt very dizzy.  He checked his blood pressure and it was only 87/37.  He had received influenza and COVID-19 shots just about a day or 2 earlier.  His symptoms improved with hydration.  He has been keeping a detailed log and most of the time his systolic blood pressure is just over 100 and his diastolic blood pressure is in the 50s or very low 60s.  He has lost a bit of weight after undergoing cholecystectomy and then hernia surgery earlier this year.  He saw his PCP yesterday and they recommended reducing the hydrochlorothiazide  dose to 12.5 mg once daily.  Otherwise he has no cardiovascular complaints.  Specifically denies chest pain or shortness of breath at rest with activity, syncope, palpitations, lower extremity edema, and other focal neurological complaints, claudication.  His memory problems have not really worsened.  He seems quite sharp today.  His lipid profile earlier this year was excellent with an HDL of 46, LDL of 27 and normal triglycerides.  As before, Nelwyn expresses his concern about the risk of stroke, after seeing his father severely incapacitated by a stroke.  His most recent carotid duplex performed last December showed less than 40% plaque bilaterally.  He had a normal echo in 2013 and normal nuclear stress tests in 2004 and 2007, but has never had coronary angiography.   He has long-standing severe hypercholesterolemia.  Despite compliance with pravastatin , his LDL cholesterol  was still very elevated at 151.  He did not tolerate more potent statins (he has tried simvastatin, rosuvastatin and  atorvastatin).  After starting Repatha  his follow-up LDL cholesterol was 54 in May 2019, a little higher at 59 in November 2019.   Past Medical History:  Diagnosis Date   Arthritis    Carotid artery occlusion    Dyslipidemia    GERD (gastroesophageal reflux disease)    Headache    ocular migraines   History of kidney stones    Peripheral vascular disease    Carotid stenosis right    PVC's (premature ventricular contractions)    Systemic hypertension    Transient global amnesia    30 years ago    Past Surgical History:  Procedure Laterality Date   BUNIONECTOMY  1994   Left foot   CAROTID ENDARTERECTOMY     CARPAL TUNNEL RELEASE  1999   Left hand   ENDARTERECTOMY Right 03/06/2017   Procedure: ENDARTERECTOMY CAROTID RIGHT;  Surgeon: Harvey Carlin BRAVO, MD;  Location: [facility] OR;  Service: Vascular;  Laterality: Right;   ENDARTERECTOMY Left 04/12/2017   Procedure: ENDARTERECTOMY CAROTID LEFT;  Surgeon: Harvey Carlin BRAVO, MD;  Location: [facility] OR;  Service: Vascular;  Laterality: Left;   PATCH ANGIOPLASTY Left 04/12/2017   Procedure: PATCH ANGIOPLASTY LEFT CAROTID ARTERY;  Surgeon: Harvey Carlin BRAVO, MD;  Location: [facility] OR;  Service: Vascular;  Laterality: Left;   TENDON REPAIR Right    right foot   TONSILLECTOMY     TOTAL KNEE ARTHROPLASTY  12/04/2010   Right   TOTAL KNEE ARTHROPLASTY Left 03/19/2018   Procedure: TOTAL KNEE ARTHROPLASTY;  Surgeon: Yvone Rush, MD;  Location: [facility] ORS;  Service: Orthopedics;  Laterality: Left;    Current Medications: Outpatient Medications Prior to Visit  Medication Sig Dispense Refill   acetaminophen  (TYLENOL ) 500 MG tablet Take 1,000 mg by mouth every 6 (six) hours as needed for moderate pain or headache.     amLODipine  (NORVASC ) 10 MG tablet Take 1 tablet (10 mg total) by mouth daily. 90 tablet 1   aspirin  EC 81 MG tablet Take 81 mg by mouth daily.     cetirizine (ZYRTEC) 10 MG tablet Take 10 mg by mouth daily.      Coenzyme Q10 200 MG capsule Take 200  mg by mouth daily.     Cyanocobalamin 2000 MCG TBCR Take 1 tablet by mouth daily at 6 (six) AM.     Evolocumab  (REPATHA  SURECLICK) 140 MG/ML SOAJ Inject 140 mg into the skin every 14 (fourteen) days. Please call to make yearly appointment for more refills 6 mL 0   famotidine (PEPCID) 20 MG tablet Take 20 mg by mouth daily.     hydrochlorothiazide  (HYDRODIURIL ) 12.5 MG tablet Take 12.5 mg by mouth daily.     hydrochlorothiazide  (HYDRODIURIL ) 25 MG tablet TAKE 1 TABLET(25 MG) BY MOUTH DAILY 90 tablet 3   levocetirizine (XYZAL) 5 MG tablet Take 5 mg by mouth.     lisinopril  (ZESTRIL ) 20 MG tablet TAKE 1 TABLET(20 MG) BY MOUTH DAILY 90 tablet 3   Magnesium  400 MG TABS Take 400 mg by mouth daily at 6 (six) AM.     rosuvastatin (CRESTOR) 5 MG tablet Take 5 mg by mouth daily.     sildenafil (REVATIO) 20 MG tablet SMARTSIG:2-5 Tablet(s) By Mouth PRN     Zinc 50 MG TABS Take 50 mg by mouth daily at 6 (six) AM.     Clobetasol Prop Emollient Base 0.05 % emollient cream Apply topically 2 (two) times daily. (Patient not taking: Reported on 12/20/2023)     fluorouracil (EFUDEX) 5 % cream Apply topically daily. (Patient not taking: Reported on 12/20/2023)     ipratropium (ATROVENT) 0.06 % nasal spray Place into the nose. (Patient not taking: Reported on 12/20/2023)     pantoprazole  (PROTONIX ) 20 MG tablet Take 1 tablet (20 mg total) by mouth daily for 14 days. 14 tablet 0   sucralfate  (CARAFATE ) 1 g tablet Take 1 tablet (1 g total) by mouth with breakfast, with lunch, and with evening meal for 7 days. 21 tablet 0   No facility-administered medications prior to visit.     Allergies:   Oxycodone hcl, Simvastatin, Amoxicillin, Oxycodone, and Statins   ROS:   Please see the history of present illness.    ROS All other systems are reviewed and are negative.  PHYSICAL EXAM:   VS:  BP 118/60 (BP Location: Right Arm, Patient Position: Sitting, Cuff Size: Normal)   Pulse 79   Ht 5' 9 (1.753 m)   Wt 178 lb (80.7  kg)   SpO2 94%   BMI 26.29 kg/m        General: Alert, oriented x3, no distress, minimally overweight Head: no evidence of trauma, PERRL, EOMI, no exophtalmos or lid lag, no myxedema, no xanthelasma; normal ears, nose and oropharynx Neck: normal jugular venous pulsations and no hepatojugular reflux; brisk carotid pulses without delay and no carotid bruits Chest: clear to auscultation, no signs of consolidation by percussion or palpation, normal fremitus, symmetrical and full respiratory excursions Cardiovascular: normal position and quality of the apical impulse, regular  rhythm, normal first and second heart sounds, no murmurs, rubs or gallops Abdomen: no tenderness or distention, no masses by palpation, no abnormal pulsatility or arterial bruits, normal bowel sounds, no hepatosplenomegaly Extremities: no clubbing, cyanosis or edema; 2+ radial, ulnar and brachial pulses bilaterally; 2+ right femoral, posterior tibial and dorsalis pedis pulses; 2+ left femoral, posterior tibial and dorsalis pedis pulses; no subclavian or femoral bruits Neurological: grossly nonfocal Psych: Normal mood and affect     Wt Readings from Last 3 Encounters:  12/20/23 178 lb (80.7 kg)  03/21/23 180 lb (81.6 kg)  01/16/23 179 lb 9.6 oz (81.5 kg)      Studies/Labs Reviewed:   EKG:    EKG Interpretation Date/Time:  Wednesday December 20 2023 09:32:31 EST Ventricular Rate:  70 PR Interval:  174 QRS Duration:  82 QT Interval:  392 QTC Calculation: 423 R Axis:   -14  Text Interpretation: Sinus rhythm with occasional Premature ventricular complexes When compared with ECG of 21-Mar-2023 00:21, Premature ventricular complexes are now Present Criteria for Anteroseptal infarct are no longer Present Nonspecific T wave abnormality no longer evident in Lateral leads Confirmed by Cariah Salatino ([id number]) on 12/20/2023 9:51:27 AM         01/16/2023 duplex carotid ultrasound: No change: less than 39% stenosis  bilaterally  Recent Labs: September 2018  Creatinine 1.33, hemoglobin 12.2, potassium 4.1  total cholesterol 223,Triglycerides 172, HDL 38, LDL 151  Normal liver function tests  02/13/2019 Total cholesterol 112, HDL 41, LDL 53, triglycerides 100 Creatinine 1.51, potassium 4.0, normal liver function tests     Component Value Date/Time   CHOL 165 12/22/2017 0825   TRIG 180 (H) 12/22/2017 0825   HDL 43 12/22/2017 0825   CHOLHDL 3.8 12/22/2017 0825   CHOLHDL 5.7 09/16/2013 0832   VLDL 44 (H) 09/16/2013 0832   LDLCALC 86 12/22/2017 0825    03/16/2023 Cholesterol 92, HDL 46, LDL 27, triglycerides 97   10/20/2020 Creatinine 1.34, hemoglobin 13.7   ASSESSMENT:    1. Heterozygous familial hypercholesterolemia   2. Bilateral extracranial carotid artery stenosis   3. Primary hypertension   4. PVCs (premature ventricular contractions)      PLAN:  In order of problems listed above:  HLP: Excellent lipid profile.  Interestingly, when we stopped the statin his cholesterol was above range on Repatha  monotherapy, but just adding a tiny amount of rosuvastatin has led to a drastic improvement in LDL.  All parameters in target range.  Continue the same regimen. S/P BILATERAL CEA for carotid stenosis: No focal neurological events.  Plan to repeat his ultrasound next month. HTN: His blood pressure is excessively well-controlled, may be because he has lost some weight.  I agree with the recommendation to decrease the hydrochlorothiazide  in half.  It will take a full 2 weeks to see the full impact of this.  I asked him to send me a log of his blood pressure in 2 weeks.  We downloaded the MyChart app for him today so he can send the information easier PVCs: Incidentally seen on the ECG today and asymptomatic.  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  No specific treatment recommended.  It is possible that his drop in blood pressure and the PVCs may be related to the immune response to his COVID-19 and influenza  shot.  He seems to be improving and the suspicion for myocarditis is very low considering his age and the otherwise normal electrocardiogram.  Will monitor for the time being.  Medication Adjustments/Labs and Tests Ordered: Current medicines are reviewed at length with the patient today.  Concerns regarding medicines are outlined above.  Medication changes, Labs and Tests ordered today are listed in the Patient Instructions below. Patient Instructions  Medication Instructions:  No changes *If you need a refill on your cardiac medications before your next appointment, please call your pharmacy*  Please send in a BP log in 2 weeks.   Lab Work: None ordered If you have labs (blood work) drawn today and your tests are completely normal, you will receive your results only by: MyChart Message (if you have MyChart) OR A paper copy in the mail If you have any lab test that is abnormal or we need to change your treatment, we will call you to review the results.  Testing/Procedures: Your physician has requested that you have a carotid duplex in December. This test is an ultrasound of the carotid arteries in your neck. It looks at blood flow through these arteries that supply the brain with blood. Allow one hour for this exam. There are no restrictions or special instructions.   Follow-Up: At [facility], you and your health needs are our priority.  As part of our continuing mission to provide you with exceptional heart care, our providers are all part of one team.  This team includes your primary Cardiologist (physician) and Advanced Practice Providers or APPs (Physician Assistants and Nurse Practitioners) who all work together to provide you with the care you need, when you need it.  Your next appointment:   1 year(s)  Provider:   Jerel Balding, MD    We recommend signing up for the patient portal called MyChart.  Sign up information is provided on this After Visit Summary.   MyChart is used to connect with patients for Virtual Visits (Telemedicine).  Patients are able to view lab/test results, encounter notes, upcoming appointments, etc.  Non-urgent messages can be sent to your provider as well.   To learn more about what you can do with MyChart, go to [email or web address].      Signed, Jerel Balding, MD  12/20/2023 5:17 PM    [facility] [address] [place], [place], [place]  [address] Phone: [phone]; Fax: [phone]

## 2023-12-20 NOTE — Patient Instructions (Addendum)
 Medication Instructions:  No changes *If you need a refill on your cardiac medications before your next appointment, please call your pharmacy*  Please send in a BP log in 2 weeks.   Lab Work: None ordered If you have labs (blood work) drawn today and your tests are completely normal, you will receive your results only by: MyChart Message (if you have MyChart) OR A paper copy in the mail If you have any lab test that is abnormal or we need to change your treatment, we will call you to review the results.  Testing/Procedures: Your physician has requested that you have a carotid duplex in December. This test is an ultrasound of the carotid arteries in your neck. It looks at blood flow through these arteries that supply the brain with blood. Allow one hour for this exam. There are no restrictions or special instructions.   Follow-Up: At [facility], you and your health needs are our priority.  As part of our continuing mission to provide you with exceptional heart care, our providers are all part of one team.  This team includes your primary Cardiologist (physician) and Advanced Practice Providers or APPs (Physician Assistants and Nurse Practitioners) who all work together to provide you with the care you need, when you need it.  Your next appointment:   1 year(s)  Provider:   Jerel Balding, MD    We recommend signing up for the patient portal called MyChart.  Sign up information is provided on this After Visit Summary.  MyChart is used to connect with patients for Virtual Visits (Telemedicine).  Patients are able to view lab/test results, encounter notes, upcoming appointments, etc.  Non-urgent messages can be sent to your provider as well.   To learn more about what you can do with MyChart, go to [email or web address].

## 2023-12-29 DIAGNOSIS — N1831 Chronic kidney disease, stage 3a: Secondary | ICD-10-CM | POA: Diagnosis not present

## 2023-12-29 DIAGNOSIS — I10 Essential (primary) hypertension: Secondary | ICD-10-CM | POA: Diagnosis not present

## 2023-12-29 DIAGNOSIS — I340 Nonrheumatic mitral (valve) insufficiency: Secondary | ICD-10-CM | POA: Diagnosis not present

## 2024-01-14 DIAGNOSIS — N1831 Chronic kidney disease, stage 3a: Secondary | ICD-10-CM | POA: Diagnosis not present

## 2024-01-14 DIAGNOSIS — I340 Nonrheumatic mitral (valve) insufficiency: Secondary | ICD-10-CM | POA: Diagnosis not present

## 2024-01-14 DIAGNOSIS — E785 Hyperlipidemia, unspecified: Secondary | ICD-10-CM | POA: Diagnosis not present

## 2024-01-14 DIAGNOSIS — I10 Essential (primary) hypertension: Secondary | ICD-10-CM | POA: Diagnosis not present

## 2024-01-16 ENCOUNTER — Telehealth: Payer: Self-pay | Admitting: Cardiovascular Disease

## 2024-01-16 NOTE — Telephone Encounter (Signed)
 Hello, pt dropped off blood pressure recordings today.   Location: Dr. Francyne mailbox

## 2024-01-19 NOTE — Telephone Encounter (Signed)
 S/w the patient- informed that Dr Francyne has reviewed his BP log. He said the readings are great and no changes in meds. Pt verbalized understanding.

## 2024-01-25 ENCOUNTER — Ambulatory Visit: Payer: Self-pay | Admitting: Cardiovascular Disease

## 2024-01-25 ENCOUNTER — Ambulatory Visit (HOSPITAL_COMMUNITY)
Admission: RE | Admit: 2024-01-25 | Discharge: 2024-01-25 | Attending: Cardiovascular Disease | Admitting: Cardiovascular Disease

## 2024-01-25 DIAGNOSIS — I6523 Occlusion and stenosis of bilateral carotid arteries: Secondary | ICD-10-CM | POA: Insufficient documentation

## 2024-02-23 ENCOUNTER — Ambulatory Visit: Admitting: Cardiovascular Disease

## 2024-02-24 ENCOUNTER — Other Ambulatory Visit: Payer: Self-pay | Admitting: Cardiovascular Disease

## 2024-02-24 DIAGNOSIS — E78011 Heterozygous familial hypercholesterolemia (hefh): Secondary | ICD-10-CM

## 2024-02-24 DIAGNOSIS — Z9889 Other specified postprocedural states: Secondary | ICD-10-CM

## 2024-02-24 DIAGNOSIS — I6523 Occlusion and stenosis of bilateral carotid arteries: Secondary | ICD-10-CM
